# Patient Record
Sex: Male | Born: 1955 | Race: White | Hispanic: No | Marital: Single | Smoking: Current every day smoker
Health system: Southern US, Community
[De-identification: ages and names within clinical notes are randomized; demographics above are authoritative.]

## PROBLEM LIST (undated history)

## (undated) DIAGNOSIS — R569 Unspecified convulsions: Secondary | ICD-10-CM

## (undated) DIAGNOSIS — I639 Cerebral infarction, unspecified: Secondary | ICD-10-CM

---

## 2013-12-19 ENCOUNTER — Emergency Department: Payer: Self-pay | Admitting: Internal Medicine

## 2018-11-28 ENCOUNTER — Encounter: Payer: Self-pay | Admitting: Emergency Medicine

## 2018-11-28 ENCOUNTER — Observation Stay: Payer: Self-pay

## 2018-11-28 ENCOUNTER — Other Ambulatory Visit: Payer: Self-pay

## 2018-11-28 ENCOUNTER — Emergency Department: Payer: Self-pay

## 2018-11-28 ENCOUNTER — Observation Stay
Admission: EM | Admit: 2018-11-28 | Discharge: 2018-11-29 | Disposition: A | Discharge status: Home / Self Care | Payer: Self-pay | Attending: Internal Medicine | Admitting: Internal Medicine

## 2018-11-28 DIAGNOSIS — F10239 Alcohol dependence with withdrawal, unspecified: Principal | ICD-10-CM | POA: Insufficient documentation

## 2018-11-28 DIAGNOSIS — R569 Unspecified convulsions: Secondary | ICD-10-CM | POA: Insufficient documentation

## 2018-11-28 DIAGNOSIS — F10939 Alcohol use, unspecified with withdrawal, unspecified: Secondary | ICD-10-CM | POA: Diagnosis present

## 2018-11-28 DIAGNOSIS — E860 Dehydration: Secondary | ICD-10-CM | POA: Insufficient documentation

## 2018-11-28 DIAGNOSIS — I6782 Cerebral ischemia: Secondary | ICD-10-CM | POA: Insufficient documentation

## 2018-11-28 DIAGNOSIS — I10 Essential (primary) hypertension: Secondary | ICD-10-CM | POA: Insufficient documentation

## 2018-11-28 DIAGNOSIS — S0081XA Abrasion of other part of head, initial encounter: Secondary | ICD-10-CM | POA: Insufficient documentation

## 2018-11-28 DIAGNOSIS — S51012A Laceration without foreign body of left elbow, initial encounter: Secondary | ICD-10-CM | POA: Insufficient documentation

## 2018-11-28 DIAGNOSIS — Z79899 Other long term (current) drug therapy: Secondary | ICD-10-CM | POA: Insufficient documentation

## 2018-11-28 DIAGNOSIS — W19XXXA Unspecified fall, initial encounter: Secondary | ICD-10-CM | POA: Insufficient documentation

## 2018-11-28 DIAGNOSIS — K219 Gastro-esophageal reflux disease without esophagitis: Secondary | ICD-10-CM | POA: Insufficient documentation

## 2018-11-28 DIAGNOSIS — S40212A Abrasion of left shoulder, initial encounter: Secondary | ICD-10-CM | POA: Insufficient documentation

## 2018-11-28 DIAGNOSIS — F1910 Other psychoactive substance abuse, uncomplicated: Secondary | ICD-10-CM | POA: Insufficient documentation

## 2018-11-28 DIAGNOSIS — R55 Syncope and collapse: Secondary | ICD-10-CM | POA: Insufficient documentation

## 2018-11-28 DIAGNOSIS — F17210 Nicotine dependence, cigarettes, uncomplicated: Secondary | ICD-10-CM | POA: Insufficient documentation

## 2018-11-28 LAB — CBC
HCT: 42.3 % (ref 39.0–52.0)
HEMOGLOBIN: 14.7 g/dL (ref 13.0–17.0)
MCH: 34.7 pg — ABNORMAL HIGH (ref 26.0–34.0)
MCHC: 34.8 g/dL (ref 30.0–36.0)
MCV: 99.8 fL (ref 80.0–100.0)
Platelets: 219 10*3/uL (ref 150–400)
RBC: 4.24 MIL/uL (ref 4.22–5.81)
RDW: 12.4 % (ref 11.5–15.5)
WBC: 6.3 10*3/uL (ref 4.0–10.5)
nRBC: 0 % (ref 0.0–0.2)

## 2018-11-28 LAB — URINALYSIS, COMPLETE (UACMP) WITH MICROSCOPIC
Bacteria, UA: NONE SEEN
Bilirubin Urine: NEGATIVE
GLUCOSE, UA: 50 mg/dL — AB
Ketones, ur: NEGATIVE mg/dL
Leukocytes, UA: NEGATIVE
NITRITE: NEGATIVE
PH: 8 (ref 5.0–8.0)
Protein, ur: NEGATIVE mg/dL
SPECIFIC GRAVITY, URINE: 1.01 (ref 1.005–1.030)
Squamous Epithelial / LPF: NONE SEEN (ref 0–5)

## 2018-11-28 LAB — URINE DRUG SCREEN, QUALITATIVE (ARMC ONLY)
Amphetamines, Ur Screen: NOT DETECTED
Barbiturates, Ur Screen: NOT DETECTED
Benzodiazepine, Ur Scrn: NOT DETECTED
COCAINE METABOLITE, UR ~~LOC~~: POSITIVE — AB
Cannabinoid 50 Ng, Ur ~~LOC~~: POSITIVE — AB
MDMA (Ecstasy)Ur Screen: NOT DETECTED
Methadone Scn, Ur: NOT DETECTED
Opiate, Ur Screen: NOT DETECTED
PHENCYCLIDINE (PCP) UR S: NOT DETECTED
Tricyclic, Ur Screen: NOT DETECTED

## 2018-11-28 LAB — BASIC METABOLIC PANEL
Anion gap: 13 (ref 5–15)
BUN: 12 mg/dL (ref 8–23)
CALCIUM: 8.8 mg/dL — AB (ref 8.9–10.3)
CO2: 21 mmol/L — ABNORMAL LOW (ref 22–32)
CREATININE: 0.96 mg/dL (ref 0.61–1.24)
Chloride: 103 mmol/L (ref 98–111)
GFR calc non Af Amer: >60 mL/min (ref >60)
Glucose, Bld: 117 mg/dL — ABNORMAL HIGH (ref 70–99)
Potassium: 3.5 mmol/L (ref 3.5–5.1)
SODIUM: 137 mmol/L (ref 135–145)

## 2018-11-28 LAB — TROPONIN I: Troponin I: <0.03 ng/mL (ref <0.03)

## 2018-11-28 LAB — ETHANOL: Alcohol, Ethyl (B): <10 mg/dL (ref <10)

## 2018-11-28 MED ORDER — NICOTINE 14 MG/24HR TD PT24
14.0000 mg | MEDICATED_PATCH | Freq: Every day | TRANSDERMAL | Status: DC
  Administered 2018-11-28 – 2018-11-29 (×2): 14 mg via TRANSDERMAL
  Filled 2018-11-28 (×2): qty 1

## 2018-11-28 MED ORDER — ADULT MULTIVITAMIN W/MINERALS CH
1.0000 | ORAL_TABLET | Freq: Every day | ORAL | Status: DC
  Administered 2018-11-28 – 2018-11-29 (×2): 1 via ORAL
  Filled 2018-11-28 (×2): qty 1

## 2018-11-28 MED ORDER — ONDANSETRON HCL 4 MG PO TABS: 4.0000 mg | ORAL_TABLET | Freq: Four times a day (QID) | ORAL | Status: DC | PRN

## 2018-11-28 MED ORDER — FOLIC ACID 1 MG PO TABS
1.0000 mg | ORAL_TABLET | Freq: Every day | ORAL | Status: DC
  Administered 2018-11-28 – 2018-11-29 (×2): 1 mg via ORAL
  Filled 2018-11-28 (×2): qty 1

## 2018-11-28 MED ORDER — PANTOPRAZOLE SODIUM 40 MG PO TBEC
40.0000 mg | DELAYED_RELEASE_TABLET | Freq: Every day | ORAL | Status: DC
  Administered 2018-11-29: 40 mg via ORAL
  Filled 2018-11-28: qty 1

## 2018-11-28 MED ORDER — ACETAMINOPHEN 325 MG PO TABS: 650.0000 mg | ORAL_TABLET | Freq: Four times a day (QID) | ORAL | Status: DC | PRN

## 2018-11-28 MED ORDER — THIAMINE HCL 100 MG/ML IJ SOLN: 100.0000 mg | Freq: Every day | INTRAMUSCULAR | Status: DC

## 2018-11-28 MED ORDER — TETANUS-DIPHTH-ACELL PERTUSSIS 5-2.5-18.5 LF-MCG/0.5 IM SUSP
0.5000 mL | Freq: Once | INTRAMUSCULAR | Status: AC
  Administered 2018-11-28: 0.5 mL via INTRAMUSCULAR
  Filled 2018-11-28: qty 0.5

## 2018-11-28 MED ORDER — LORAZEPAM 2 MG PO TABS: 0.0000 mg | ORAL_TABLET | Freq: Four times a day (QID) | ORAL | Status: DC

## 2018-11-28 MED ORDER — CLONIDINE HCL 0.1 MG PO TABS
0.2000 mg | ORAL_TABLET | Freq: Two times a day (BID) | ORAL | Status: DC
  Administered 2018-11-28 – 2018-11-29 (×2): 0.2 mg via ORAL
  Filled 2018-11-28 (×2): qty 2

## 2018-11-28 MED ORDER — SODIUM CHLORIDE 0.9 % IV BOLUS
1000.0000 mL | Freq: Once | INTRAVENOUS | Status: AC
  Administered 2018-11-28: 1000 mL via INTRAVENOUS

## 2018-11-28 MED ORDER — VITAMIN B-1 100 MG PO TABS
100.0000 mg | ORAL_TABLET | Freq: Every day | ORAL | Status: DC
  Administered 2018-11-28 – 2018-11-29 (×2): 100 mg via ORAL
  Filled 2018-11-28 (×2): qty 1

## 2018-11-28 MED ORDER — ENOXAPARIN SODIUM 40 MG/0.4ML ~~LOC~~ SOLN
40.0000 mg | SUBCUTANEOUS | Status: DC
  Administered 2018-11-28: 40 mg via SUBCUTANEOUS
  Filled 2018-11-28: qty 0.4

## 2018-11-28 MED ORDER — BACITRACIN-NEOMYCIN-POLYMYXIN 400-5-5000 EX OINT
TOPICAL_OINTMENT | Freq: Once | CUTANEOUS | Status: AC
  Administered 2018-11-28: 1 via TOPICAL
  Filled 2018-11-28: qty 1

## 2018-11-28 MED ORDER — LORAZEPAM 1 MG PO TABS: 1.0000 mg | ORAL_TABLET | Freq: Four times a day (QID) | ORAL | Status: DC | PRN

## 2018-11-28 MED ORDER — THIAMINE HCL 100 MG/ML IJ SOLN
Freq: Once | INTRAVENOUS | Status: AC
  Administered 2018-11-28: 18:00:00 via INTRAVENOUS
  Filled 2018-11-28: qty 1000

## 2018-11-28 MED ORDER — ONDANSETRON HCL 4 MG/2ML IJ SOLN: 4.0000 mg | Freq: Four times a day (QID) | INTRAMUSCULAR | Status: DC | PRN

## 2018-11-28 MED ORDER — LORAZEPAM 2 MG PO TABS: 0.0000 mg | ORAL_TABLET | Freq: Two times a day (BID) | ORAL | Status: DC

## 2018-11-28 MED ORDER — HYDRALAZINE HCL 20 MG/ML IJ SOLN
10.0000 mg | INTRAMUSCULAR | Status: DC | PRN
  Administered 2018-11-28: 10 mg via INTRAVENOUS
  Filled 2018-11-28: qty 1

## 2018-11-28 MED ORDER — LORAZEPAM 2 MG/ML IJ SOLN
1.0000 mg | Freq: Four times a day (QID) | INTRAMUSCULAR | Status: DC | PRN
  Administered 2018-11-28: 1 mg via INTRAVENOUS
  Filled 2018-11-28: qty 1

## 2018-11-28 MED ORDER — SENNOSIDES-DOCUSATE SODIUM 8.6-50 MG PO TABS: 1.0000 | ORAL_TABLET | Freq: Every evening | ORAL | Status: DC | PRN

## 2018-11-28 MED ORDER — ACETAMINOPHEN 650 MG RE SUPP: 650.0000 mg | Freq: Four times a day (QID) | RECTAL | Status: DC | PRN

## 2018-11-28 NOTE — ED Triage Notes (Signed)
pt presents from home via acems with c/o possible seizure. No hx of seizures, cbg 99. 18G in right AC. BP 172/116 for ems. No hx of medications or PCP. No known allergies. Small skin tear noted to left elbow. Family states that pt may have had stroke 1 month ago, but did not seek medical attention for it. Pt drags left foot now according to family.

## 2018-11-28 NOTE — ED Provider Notes (Signed)
[facility] Emergency Department Provider Note  ____________________________________________  Time seen: Approximately 1:02 PM  I have reviewed the triage vital signs and the nursing notes.   HISTORY  Chief Complaint Seizures    HPI Zachary Pugh. is a 62 y.o. male history of daily alcohol use presenting for recurrent syncope.  The patient reports that over the last several months he has had multiple episodes where he "falls asleep on the couch and wakes up on the floor."  Sometimes, he has urinary incontinence with this.  It has never been witnessed so no comment can be made about tonic-clonic movements.  The patient does not have any associated chest pain, shortness of breath, lightheadedness or syncope.  He has been eating and drinking normally.  He does report to drinking "several beers" daily, with his last alcohol intake yesterday.  He is unable to report whether the prior episodes have occurred when he has stopped drinking as well.  At this time, the patient is symptom-free.  History reviewed. No pertinent past medical history.  There are no active problems to display for this patient.   History reviewed. No pertinent surgical history.    Allergies Patient has no known allergies.  History reviewed. No pertinent family history.  Social History Social History   Tobacco Use  . Smoking status: Current Every Day Smoker    Packs/day: 1.00  . Smokeless tobacco: Never Used  Substance Use Topics  . Alcohol use: Yes  . Drug use: Never    Review of Systems Constitutional: No fever/chills.  Positive syncope. Eyes: No visual changes.  No blurred or double vision. ENT: No sore throat. No congestion or rhinorrhea. Cardiovascular: Denies chest pain. Denies palpitations. Respiratory: Denies shortness of breath.  No cough. Gastrointestinal: No abdominal pain.  No nausea, no vomiting.  No diarrhea.  No constipation. Genitourinary: Negative for  dysuria. Musculoskeletal: Negative for back pain. Skin: Negative for rash. Neurological: Negative for headaches. No focal numbness, tingling or weakness.  Psych: Daily alcohol intake.    ____________________________________________   PHYSICAL EXAM:  VITAL SIGNS: ED Triage Vitals  Enc Vitals Group     BP 11/28/18 1156 (!) 169/109     Pulse Rate 11/28/18 1156 78     Resp 11/28/18 1156 17     Temp 11/28/18 1156 97.6 F (36.4 C)     Temp Source 11/28/18 1156 Oral     SpO2 11/28/18 1156 95 %     Weight 11/28/18 1157 125 lb (56.7 kg)     Height 11/28/18 1157 5\' 8"  (1.727 m)     Head Circumference --      Peak Flow --      Pain Score 11/28/18 1157 0     Pain Loc --      Pain Edu? --      Excl. in GC? --     Constitutional: Alert and oriented. Answers questions appropriately.  GCS is 15. Eyes: Conjunctivae are normal.  EOMI. PERRLA.  No scleral icterus.  No raccoon eyes. Head: See skin examination.. Nose: No congestion/rhinnorhea.  See skin examination. Mouth/Throat: Mucous membranes are moist.  Neck: No stridor.  Supple.  No JVD.  No meningismus. Cardiovascular: Normal rate, regular rhythm. No murmurs, rubs or gallops.  Respiratory: Normal respiratory effort.  No accessory muscle use or retractions. Lungs CTAB.  No wheezes, rales or ronchi. Gastrointestinal: Soft, nontender and nondistended.  No guarding or rebound.  No peritoneal signs. Musculoskeletal: No LE edema. No ttp in  the calves or palpable cords.  Negative Homan's sign.  Full range of motion of the left shoulder, elbow and wrist.  No midline C-spine tenderness to palpation, step-offs or deformities.  No T or L-spine tenderness to palpation, step-offs or deformities. Neurologic:  A&Ox3.  Speech is clear.  Face and smile are symmetric.  EOMI. PERRLA.  Moves all extremities well. Skin:  Skin is warm, dry.  The patient has several superficial linear abrasions to the nose.  He also has 1.5 x 1.5 cm abrasion to the left  shoulder.  1 x 1 cm skin tear in the over the left elbow. Psychiatric: Mood and affect are normal.   ____________________________________________   LABS (all labs ordered are listed, but only abnormal results are displayed)  Labs Reviewed  CBC - Abnormal; Notable for the following components:      Result Value   MCH 34.7 (*)    All other components within normal limits  BASIC METABOLIC PANEL - Abnormal; Notable for the following components:   CO2 21 (*)    Glucose, Bld 117 (*)    Calcium 8.8 (*)    All other components within normal limits  URINALYSIS, COMPLETE (UACMP) WITH MICROSCOPIC  TROPONIN I  ETHANOL  URINE DRUG SCREEN, QUALITATIVE ([facility] ONLY)   ____________________________________________  EKG  ED ECG REPORT I, Anne-Caroline Sharma Covert, the attending physician, personally viewed and interpreted this ECG.   Date: 11/28/2018  EKG Time: 1208  Rate: 70  Rhythm: normal sinus rhythm  Axis: normal  Intervals:prolonged Qtc  ST&T Change: No STEMI  ____________________________________________  RADIOLOGY  Ct Head Wo Contrast  Result Date: 11/28/2018 CLINICAL DATA:  Possible seizure today. EXAM: CT HEAD WITHOUT CONTRAST TECHNIQUE: Contiguous axial images were obtained from the base of the skull through the vertex without intravenous contrast. COMPARISON:  None. FINDINGS: Brain: No evidence of acute infarction, hemorrhage, hydrocephalus, extra-axial collection or mass lesion/mass effect. Patchy and confluent hypoattenuation in the subcortical and periventricular deep white matter is consistent with chronic microvascular ischemic change. Vascular: Atherosclerosis is noted. Skull: Intact.  No focal lesion. Sinuses/Orbits: Negative. Other: None. IMPRESSION: No acute abnormality. Chronic microvascular ischemic change. Atherosclerosis. Electronically Signed   By: Drusilla Kanner M.D.   On: 11/28/2018 12:31     ____________________________________________   PROCEDURES  Procedure(s) performed: None  Procedures  Critical Care performed: No ____________________________________________   INITIAL IMPRESSION / ASSESSMENT AND PLAN / ED COURSE  Pertinent labs & imaging results that were available during my care of the patient were reviewed by me and considered in my medical decision making (see chart for details).  62 y.o. male with a history of daily alcohol use resenting for recurrent syncope of unknown etiology.  Overall, the patient does have hypertension, no focal neurologic deficits on my examination.  Is possible that the patient is having alcohol withdrawal seizures, although he states he had alcohol yesterday which would be somewhat early for withdrawal seizures.  The patient CT scan does not show any mass or evidence of infarct.  An MRI of the brain has been ordered.  The patient's electrolytes are reassuring, and his blood counts are normal.  Urine drug screen and alcohol level have been ordered.  Troponin and Chest Xray is pending.  ____________________________________________  FINAL CLINICAL IMPRESSION(S) / ED DIAGNOSES  Final diagnoses:  Recurrent syncope  Abrasion, face w/o infection  Abrasion of left shoulder, initial encounter  Skin tear of left elbow without complication, initial encounter         NEW  MEDICATIONS STARTED DURING THIS VISIT:  New Prescriptions   No medications on file      Rockne MenghiniNorman, Anne-Caroline, MD 11/28/18 1315

## 2018-11-28 NOTE — Progress Notes (Signed)
Advanced care plan.  Purpose of the Encounter: CODE STATUS  Parties in Attendance:Patient  Patient's Decision Capacity:Good  Subjective/Patient's story: Presented to the emergency room for questionable seizure  Objective/Medical story Patient drinks alcohol on a regular basis Appears to be in withdrawal Needs management for alcohol withdrawal and IV fluids  Goals of care determination:  Advance care directives and goals of care discussed Patient wants everything for now which includes cpr , intubation and ventilator if need arises  CODE STATUS: Full code  Time spent discussing advanced care planning: 16 minutes

## 2018-11-28 NOTE — ED Notes (Signed)
Patient transported to CT 

## 2018-11-28 NOTE — ED Notes (Signed)
Pt assisted with urinal. Pt cleaned and repositioned in bed. This RN will continue to monitor.

## 2018-11-28 NOTE — ED Notes (Signed)
Pt reports he has no recollection of seizure. Pt reports to this RN that he does not remember ambulance picking him up today.

## 2018-11-28 NOTE — H&P (Signed)
[facility] - [place] at [facility]   PATIENT NAME: Zachary Pugh    MR#:  [id number]  DATE OF BIRTH:  27-Nov-1956  DATE OF ADMISSION:  11/28/2018  PRIMARY CARE PHYSICIAN: Patient, No Pcp Per   REQUESTING/REFERRING PHYSICIAN:   CHIEF COMPLAINT:   Chief Complaint  Patient presents with  . Seizures    HISTORY OF PRESENT ILLNESS: Zachary Pugh  is a 62 y.o. male with a known history of alcohol abuse, tobacco abuse presented to the emergency room for questionable seizure.  Patient states he drinks alcohol on a regular basis.  He has beer every day.  Says he sleeps on the couch and landed on the floor.  This has been happening consistently.  He was evaluated in the emergency room for seizures secondary to alcohol withdrawal.  Has some tremors in the upper extremities.  No history of head injury.  PAST MEDICAL HISTORY:  Alcohol abuse  PAST SURGICAL HISTORY: None  SOCIAL HISTORY:  Social History   Tobacco Use  . Smoking status: Current Every Day Smoker    Packs/day: 1.00  . Smokeless tobacco: Never Used  Substance Use Topics  . Alcohol use: Yes    FAMILY HISTORY: Mother deceased Father no history of copd, diabetes, hypertension  DRUG ALLERGIES: No Known Allergies  REVIEW OF SYSTEMS:   CONSTITUTIONAL: No fever,has  fatigue and weakness.  EYES: No blurred or double vision.  EARS, NOSE, AND THROAT: No tinnitus or ear pain.  RESPIRATORY: No cough, shortness of breath, wheezing or hemoptysis.  CARDIOVASCULAR: No chest pain, orthopnea, edema.  GASTROINTESTINAL: No nausea, vomiting, diarrhea or abdominal pain.  GENITOURINARY: No dysuria, hematuria.  ENDOCRINE: No polyuria, nocturia,  HEMATOLOGY: No anemia, easy bruising or bleeding SKIN: No rash or lesion. MUSCULOSKELETAL: No joint pain or arthritis. Tremors upper extremities.   NEUROLOGIC: No tingling, numbness, weakness.  PSYCHIATRY: No anxiety or depression.   MEDICATIONS AT HOME:  Prior to  Admission medications   Medication Sig Start Date End Date Taking? Authorizing Provider  omeprazole (PRILOSEC) 20 MG capsule Take 20 mg by mouth daily.   Yes [provider]      PHYSICAL EXAMINATION:   VITAL SIGNS: Blood pressure (!) 164/79, pulse 80, temperature 97.6 F (36.4 C), temperature source Oral, resp. rate (!) 22, height 5\' 8"  (1.727 m), weight 56.7 kg, SpO2 100 %.  GENERAL:  62 y.o.-year-old patient lying in the bed with no acute distress.  EYES: Pupils equal, round, reactive to light and accommodation. No scleral icterus. Extraocular muscles intact.  HEENT: Head atraumatic, normocephalic. Oropharynx and nasopharynx clear.  NECK:  Supple, no jugular venous distention. No thyroid enlargement, no tenderness.  LUNGS: Normal breath sounds bilaterally, no wheezing, rales,rhonchi or crepitation. No use of accessory muscles of respiration.  CARDIOVASCULAR: S1, S2 normal. No murmurs, rubs, or gallops.  ABDOMEN: Soft, nontender, nondistended. Bowel sounds present. No organomegaly or mass.  EXTREMITIES: No pedal edema, cyanosis, or clubbing.  Tremors upper extremities NEUROLOGIC: Cranial nerves II through XII are intact. Muscle strength 5/5 in all extremities. Sensation intact. Gait not checked.  PSYCHIATRIC: The patient is alert and oriented x 3.  SKIN: No obvious rash, lesion, or ulcer.   LABORATORY PANEL:   CBC Recent Labs  Lab 11/28/18 1205  WBC 6.3  HGB 14.7  HCT 42.3  PLT 219  MCV 99.8  MCH 34.7*  MCHC 34.8  RDW 12.4   ------------------------------------------------------------------------------------------------------------------  Chemistries  Recent Labs  Lab 11/28/18 1205  NA 137  K  3.5  CL 103  CO2 21*  GLUCOSE 117*  BUN 12  CREATININE 0.96  CALCIUM 8.8*   ------------------------------------------------------------------------------------------------------------------ estimated creatinine clearance is 64 mL/min (by C-G formula based on SCr  of 0.96 mg/dL). ------------------------------------------------------------------------------------------------------------------ No results for input(s): TSH, T4TOTAL, T3FREE, THYROIDAB in the last 72 hours.  Invalid input(s): FREET3   Coagulation profile No results for input(s): INR, PROTIME in the last 168 hours. ------------------------------------------------------------------------------------------------------------------- No results for input(s): DDIMER in the last 72 hours. -------------------------------------------------------------------------------------------------------------------  Cardiac Enzymes Recent Labs  Lab 11/28/18 1205  TROPONINI <0.03   ------------------------------------------------------------------------------------------------------------------ Invalid input(s): POCBNP  ---------------------------------------------------------------------------------------------------------------  Urinalysis    Component Value Date/Time   COLORURINE STRAW (A) 11/28/2018 1305   APPEARANCEUR CLEAR (A) 11/28/2018 1305   LABSPEC 1.010 11/28/2018 1305   PHURINE 8.0 11/28/2018 1305   GLUCOSEU 50 (A) 11/28/2018 1305   HGBUR SMALL (A) 11/28/2018 1305   BILIRUBINUR NEGATIVE 11/28/2018 1305   KETONESUR NEGATIVE 11/28/2018 1305   PROTEINUR NEGATIVE 11/28/2018 1305   NITRITE NEGATIVE 11/28/2018 1305   LEUKOCYTESUR NEGATIVE 11/28/2018 1305     RADIOLOGY: Ct Head Wo Contrast  Result Date: 11/28/2018 CLINICAL DATA:  Possible seizure today. EXAM: CT HEAD WITHOUT CONTRAST TECHNIQUE: Contiguous axial images were obtained from the base of the skull through the vertex without intravenous contrast. COMPARISON:  None. FINDINGS: Brain: No evidence of acute infarction, hemorrhage, hydrocephalus, extra-axial collection or mass lesion/mass effect. Patchy and confluent hypoattenuation in the subcortical and periventricular deep white matter is consistent with chronic microvascular  ischemic change. Vascular: Atherosclerosis is noted. Skull: Intact.  No focal lesion. Sinuses/Orbits: Negative. Other: None. IMPRESSION: No acute abnormality. Chronic microvascular ischemic change. Atherosclerosis. Electronically Signed   By: Drusilla Kannerhomas  Dalessio M.D.   On: 11/28/2018 12:31    EKG: Orders placed or performed during the hospital encounter of 11/28/18  . ED EKG  . ED EKG    IMPRESSION AND PLAN:  62 year old male patient with history of alcohol abuse, tobacco abuse presented to the emergency room for weakness, tremors in the upper extremity  -Alcohol withdrawal IV fluids Thiamine folic acid supplements CIWA protocol Admit under observation  -Tobacco abuse Tobacco cessation counseled to the patient for 6 minutes Nicotine patch offered  -Dehydration IV fluid hydration with normal saline  -DVT prophylaxis subcu Lovenox daily  All the records are reviewed and case discussed with ED provider. Management plans discussed with the patient, family and they are in agreement.  CODE STATUS:Full code    TOTAL TIME TAKING CARE OF THIS PATIENT: 53 minutes.    Ihor AustinPavan Pyreddy M.D on 11/28/2018 at 2:31 PM  Between 7am to 6pm - Pager - [phone]  After 6pm go to [email or web address] - password [email or web address]  [facility] [place] [facility]  Office  [phone]  CC: Primary care physician; Patient, No Pcp Per

## 2018-11-29 LAB — BASIC METABOLIC PANEL
Anion gap: 9 (ref 5–15)
BUN: 12 mg/dL (ref 8–23)
CO2: 21 mmol/L — ABNORMAL LOW (ref 22–32)
Calcium: 8.2 mg/dL — ABNORMAL LOW (ref 8.9–10.3)
Chloride: 104 mmol/L (ref 98–111)
Creatinine, Ser: 1 mg/dL (ref 0.61–1.24)
GFR calc Af Amer: >60 mL/min (ref >60)
GLUCOSE: 93 mg/dL (ref 70–99)
Potassium: 3.3 mmol/L — ABNORMAL LOW (ref 3.5–5.1)
Sodium: 134 mmol/L — ABNORMAL LOW (ref 135–145)

## 2018-11-29 LAB — CBC
HCT: 37.9 % — ABNORMAL LOW (ref 39.0–52.0)
Hemoglobin: 13.1 g/dL (ref 13.0–17.0)
MCH: 34.5 pg — ABNORMAL HIGH (ref 26.0–34.0)
MCHC: 34.6 g/dL (ref 30.0–36.0)
MCV: 99.7 fL (ref 80.0–100.0)
Platelets: 224 10*3/uL (ref 150–400)
RBC: 3.8 MIL/uL — ABNORMAL LOW (ref 4.22–5.81)
RDW: 12.4 % (ref 11.5–15.5)
WBC: 6.9 10*3/uL (ref 4.0–10.5)
nRBC: 0 % (ref 0.0–0.2)

## 2018-11-29 MED ORDER — AMLODIPINE BESYLATE 5 MG PO TABS: 5.0000 mg | ORAL_TABLET | Freq: Every day | ORAL | 1 refills | Status: DC

## 2018-11-29 MED ORDER — POTASSIUM CHLORIDE CRYS ER 20 MEQ PO TBCR
40.0000 meq | EXTENDED_RELEASE_TABLET | ORAL | Status: AC
  Administered 2018-11-29 (×2): 40 meq via ORAL
  Filled 2018-11-29 (×2): qty 2

## 2018-11-29 NOTE — Care Management Note (Signed)
Case Management Note  Patient Details  Name: Zachary DolphinJames W Henrichs Jr. MRN: [id number] Date of Birth: 1956-03-20   Patient to discharge today.  Provided application to Medication Management  [facility] . Patient to discharge on Norvasc.  [facility] [address]  RNCM checked [email or web address] and cost is $9.  No coupon needed.  Patient confirms he will be able to pick up after discharge.   Subjective/Objective:                    Action/Plan:   Expected Discharge Date:  11/29/18               Expected Discharge Plan:  Home/Self Care  In-House Referral:     Discharge planning Services  CM Consult, Medication Assistance, Indigent Health Clinic  Post Acute Care Choice:    Choice offered to:     DME Arranged:    DME Agency:     HH Arranged:    HH Agency:     Status of Service:  Completed, signed off  If discussed at [employer] of [employer] Meetings, dates discussed:    Additional Comments:  Chapman FitchBOWEN, Kiylee Thoreson T, RN 11/29/2018, 3:12 PM

## 2018-11-29 NOTE — Progress Notes (Signed)
Patient discharged to home via POV driven by brother Trey PaulaJeff. Patient stated, "feeling a lot better today than yesterday". Discharge instructions given. Prescription given. Patient able to verbalize signs and symptoms of increased blood pressure. IV removed. Belongings gathered. Wheeled to visitors entrance by NT.

## 2018-11-30 LAB — HIV ANTIBODY (ROUTINE TESTING W REFLEX): HIV Screen 4th Generation wRfx: NONREACTIVE

## 2018-12-08 NOTE — Discharge Summary (Signed)
[facility] [place] at [facility]   PATIENT NAME: Zachary FreerJames Pugh    MR#:  [id number]  DATE OF BIRTH:  Oct 18, 1956  DATE OF ADMISSION:  11/28/2018   ADMITTING PHYSICIAN: Ihor AustinPavan Pyreddy, MD  DATE OF DISCHARGE: 11/29/2018  4:44 PM  PRIMARY CARE PHYSICIAN: Patient, No Pcp Per   ADMISSION DIAGNOSIS:   Abrasion, face w/o infection [S00.81XA] Abrasion of left shoulder, initial encounter [S40.212A] Skin tear of left elbow without complication, initial encounter [S51.012A] Recurrent syncope [R55]  DISCHARGE DIAGNOSIS:   Active Problems:   Alcohol withdrawal (HCC)   SECONDARY DIAGNOSIS:   History reviewed. No pertinent past medical history.  HOSPITAL COURSE:   62 year old male with past medical history significant for smoking, alcohol abuse and drug abuse presents to hospital secondary to an episode of fall while sleeping.  1.  Fall while sleeping-concern for seizure.  However now witnessed seizure activity -Patient has been alert and oriented since admission. -Patient states he drank alcohol until the morning before.  Urine tox was positive for cocaine and marijuana -Could have been toxic encephalopathy from polysubstance use. -No new seizure medications were started -CT of the head negative for any acute findings  2.  Polysubstance abuse-strongly counseled  3.  Hypertension-started on Norvasc here  4.  GERD-on Prilosec  Patient is independent and ambulatory.  DISCHARGE CONDITIONS:   Guarded CONSULTS OBTAINED:   None  DRUG ALLERGIES:   No Known Allergies DISCHARGE MEDICATIONS:   Allergies as of 11/29/2018   No Known Allergies     Medication List    TAKE these medications   amLODipine 5 MG tablet Commonly known as:  NORVASC Take 1 tablet (5 mg total) by mouth daily.   omeprazole 20 MG capsule Commonly known as:  PRILOSEC Take 20 mg by mouth daily.        DISCHARGE INSTRUCTIONS:   1.  PCP follow-up in 1 to 2 weeks  DIET:    Cardiac diet  ACTIVITY:   Activity as tolerated  OXYGEN:   Home Oxygen: No.  Oxygen Delivery: room air  DISCHARGE LOCATION:   home   If you experience worsening of your admission symptoms, develop shortness of breath, life threatening emergency, suicidal or homicidal thoughts you must seek medical attention immediately by calling 911 or calling your MD immediately  if symptoms less severe.  You Must read complete instructions/literature along with all the possible adverse reactions/side effects for all the Medicines you take and that have been prescribed to you. Take any new Medicines after you have completely understood and accpet all the possible adverse reactions/side effects.   Please note  You were cared for by a hospitalist during your hospital stay. If you have any questions about your discharge medications or the care you received while you were in the hospital after you are discharged, you can call the unit and asked to speak with the hospitalist on call if the hospitalist that took care of you is not available. Once you are discharged, your primary care physician will handle any further medical issues. Please note that NO REFILLS for any discharge medications will be authorized once you are discharged, as it is imperative that you return to your primary care physician (or establish a relationship with a primary care physician if you do not have one) for your aftercare needs so that they can reassess your need for medications and monitor your lab values.    On the day of Discharge:  VITAL SIGNS:   Blood  pressure 125/78, pulse 88, temperature 98.3 F (36.8 C), temperature source Oral, resp. rate 17, height 5\' 8"  (1.727 m), weight 56.7 kg, SpO2 99 %.  PHYSICAL EXAMINATION:    GENERAL:  62 y.o.-year-old patient lying in the bed with no acute distress.  EYES: Pupils equal, round, reactive to light and accommodation. No scleral icterus. Extraocular muscles intact.  HEENT:  Head atraumatic, normocephalic. Oropharynx and nasopharynx clear.  NECK:  Supple, no jugular venous distention. No thyroid enlargement, no tenderness.  LUNGS: Normal breath sounds bilaterally, no wheezing, rales,rhonchi or crepitation. No use of accessory muscles of respiration.  CARDIOVASCULAR: S1, S2 normal. No murmurs, rubs, or gallops.  ABDOMEN: Soft, non-tender, non-distended. Bowel sounds present. No organomegaly or mass.  EXTREMITIES: No pedal edema, cyanosis, or clubbing.  NEUROLOGIC: Cranial nerves II through XII are intact. Muscle strength 5/5 in all extremities. Sensation intact. Gait not checked.  PSYCHIATRIC: The patient is alert and oriented x 3.  SKIN: No obvious rash, lesion, or ulcer.   DATA REVIEW:   CBC No results for input(s): WBC, HGB, HCT, PLT in the last 168 hours.  Chemistries  No results for input(s): NA, K, CL, CO2, GLUCOSE, BUN, CREATININE, CALCIUM, MG, AST, ALT, ALKPHOS, BILITOT in the last 168 hours.  Invalid input(s): GFRCGP   Microbiology Results  No results found for this or any previous visit.  RADIOLOGY:  No results found.   Management plans discussed with the patient, family and they are in agreement.  CODE STATUS:  Code Status History    Date Active Date Inactive Code Status Order ID Comments User Context   11/28/2018 1656 11/29/2018 1944 Full Code [id number]  Ihor AustinPyreddy, Pavan, MD Inpatient      TOTAL TIME TAKING CARE OF THIS PATIENT: 38 minutes.    Enid Baasadhika Paul Trettin M.D on 12/08/2018 at 8:21 AM  Between 7am to 6pm - Pager - [phone]  After 6pm go to [email or web address] - Social research officer, [email or web address] [email or web address]  [facility] [place] [facility]  Office  [phone]  CC: Primary care physician; Patient, No Pcp Per   Note: This dictation was prepared with Dragon dictation along with smaller phrase technology. Any transcriptional errors that result from this process are unintentional.

## 2019-07-27 ENCOUNTER — Emergency Department
Admission: EM | Admit: 2019-07-27 | Discharge: 2019-07-27 | Disposition: A | Discharge status: Home / Self Care | Payer: Self-pay | Attending: Emergency Medicine | Admitting: Emergency Medicine

## 2019-07-27 ENCOUNTER — Other Ambulatory Visit: Payer: Self-pay

## 2019-07-27 DIAGNOSIS — R569 Unspecified convulsions: Secondary | ICD-10-CM

## 2019-07-27 DIAGNOSIS — G4089 Other seizures: Secondary | ICD-10-CM | POA: Insufficient documentation

## 2019-07-27 DIAGNOSIS — F17210 Nicotine dependence, cigarettes, uncomplicated: Secondary | ICD-10-CM | POA: Insufficient documentation

## 2019-07-27 HISTORY — DX: Unspecified convulsions: R56.9

## 2019-07-27 HISTORY — DX: Cerebral infarction, unspecified: I63.9

## 2019-07-27 LAB — URINALYSIS, COMPLETE (UACMP) WITH MICROSCOPIC
Bilirubin Urine: NEGATIVE
Glucose, UA: NEGATIVE mg/dL
Hgb urine dipstick: NEGATIVE
Ketones, ur: NEGATIVE mg/dL
Leukocytes,Ua: NEGATIVE
Nitrite: NEGATIVE
Protein, ur: 100 mg/dL — AB
Specific Gravity, Urine: 1.016 (ref 1.005–1.030)
Squamous Epithelial / HPF: NONE SEEN (ref 0–5)
pH: 9 — ABNORMAL HIGH (ref 5.0–8.0)

## 2019-07-27 LAB — COMPREHENSIVE METABOLIC PANEL
ALT: 14 U/L (ref 0–44)
AST: 38 U/L (ref 15–41)
Albumin: 3.4 g/dL — ABNORMAL LOW (ref 3.5–5.0)
Alkaline Phosphatase: 63 U/L (ref 38–126)
Anion gap: 15 (ref 5–15)
BUN: 12 mg/dL (ref 8–23)
CO2: 27 mmol/L (ref 22–32)
Calcium: 8.4 mg/dL — ABNORMAL LOW (ref 8.9–10.3)
Chloride: 92 mmol/L — ABNORMAL LOW (ref 98–111)
Creatinine, Ser: 1.33 mg/dL — ABNORMAL HIGH (ref 0.61–1.24)
GFR calc Af Amer: >60 mL/min (ref >60)
GFR calc non Af Amer: 57 mL/min — ABNORMAL LOW (ref >60)
Glucose, Bld: 121 mg/dL — ABNORMAL HIGH (ref 70–99)
Potassium: 3.1 mmol/L — ABNORMAL LOW (ref 3.5–5.1)
Sodium: 134 mmol/L — ABNORMAL LOW (ref 135–145)
Total Bilirubin: 0.8 mg/dL (ref 0.3–1.2)
Total Protein: 6.1 g/dL — ABNORMAL LOW (ref 6.5–8.1)

## 2019-07-27 LAB — URINE DRUG SCREEN, QUALITATIVE (ARMC ONLY)
Amphetamines, Ur Screen: NOT DETECTED
Barbiturates, Ur Screen: NOT DETECTED
Benzodiazepine, Ur Scrn: NOT DETECTED
Cannabinoid 50 Ng, Ur ~~LOC~~: NOT DETECTED
Cocaine Metabolite,Ur ~~LOC~~: POSITIVE — AB
MDMA (Ecstasy)Ur Screen: NOT DETECTED
Methadone Scn, Ur: NOT DETECTED
Opiate, Ur Screen: NOT DETECTED
Phencyclidine (PCP) Ur S: NOT DETECTED
Tricyclic, Ur Screen: NOT DETECTED

## 2019-07-27 LAB — CBC WITH DIFFERENTIAL/PLATELET
Abs Immature Granulocytes: 0.01 10*3/uL (ref 0.00–0.07)
Basophils Absolute: 0 10*3/uL (ref 0.0–0.1)
Basophils Relative: 1 %
Eosinophils Absolute: 0 10*3/uL (ref 0.0–0.5)
Eosinophils Relative: 0 %
HCT: 38.1 % — ABNORMAL LOW (ref 39.0–52.0)
Hemoglobin: 13 g/dL (ref 13.0–17.0)
Immature Granulocytes: 0 %
Lymphocytes Relative: 13 %
Lymphs Abs: 0.7 10*3/uL (ref 0.7–4.0)
MCH: 34.6 pg — ABNORMAL HIGH (ref 26.0–34.0)
MCHC: 34.1 g/dL (ref 30.0–36.0)
MCV: 101.3 fL — ABNORMAL HIGH (ref 80.0–100.0)
Monocytes Absolute: 0.5 10*3/uL (ref 0.1–1.0)
Monocytes Relative: 9 %
Neutro Abs: 4.3 10*3/uL (ref 1.7–7.7)
Neutrophils Relative %: 77 %
Platelets: 216 10*3/uL (ref 150–400)
RBC: 3.76 MIL/uL — ABNORMAL LOW (ref 4.22–5.81)
RDW: 11.9 % (ref 11.5–15.5)
WBC: 5.6 10*3/uL (ref 4.0–10.5)
nRBC: 0 % (ref 0.0–0.2)

## 2019-07-27 LAB — GLUCOSE, CAPILLARY: Glucose-Capillary: 112 mg/dL — ABNORMAL HIGH (ref 70–99)

## 2019-07-27 LAB — ETHANOL: Alcohol, Ethyl (B): <10 mg/dL (ref <10)

## 2019-07-27 MED ORDER — LEVETIRACETAM 500 MG PO TABS
500.0000 mg | ORAL_TABLET | Freq: Once | ORAL | Status: AC
  Administered 2019-07-27: 14:00:00 500 mg via ORAL
  Filled 2019-07-27: qty 1

## 2019-07-27 MED ORDER — LORAZEPAM 2 MG/ML IJ SOLN
1.0000 mg | Freq: Once | INTRAMUSCULAR | Status: AC
  Administered 2019-07-27: 1 mg via INTRAVENOUS
  Filled 2019-07-27: qty 1

## 2019-07-27 MED ORDER — LEVETIRACETAM 500 MG PO TABS: 500.0000 mg | ORAL_TABLET | Freq: Two times a day (BID) | ORAL | 1 refills | Status: DC

## 2019-07-27 MED ORDER — SODIUM CHLORIDE 0.9 % IV SOLN
Freq: Once | INTRAVENOUS | Status: AC
  Administered 2019-07-27: 12:00:00 via INTRAVENOUS

## 2019-07-27 NOTE — ED Notes (Signed)
Pt attempting to get ahold of Merry Proud who will be pt's ride home. Merry Proud won't answer at number in demographics. Pt unsure of any other numbers to call from hospital phone. Doesn't have personal phone with him.

## 2019-07-27 NOTE — ED Notes (Signed)
Pt unable to get ahold of any other friends or family to get home. Agricultural consultant notified. May need bus pass.

## 2019-07-27 NOTE — ED Notes (Signed)
Family Jeff called this RN. Merry Proud notified pt being d/c. Merry Proud on his way to pick pt up.

## 2019-07-27 NOTE — ED Notes (Signed)
Pt given warm blanket.

## 2019-07-27 NOTE — ED Notes (Signed)
Pt wheeled out to lobby.  

## 2019-07-27 NOTE — ED Notes (Signed)
Seizure pads in place

## 2019-07-27 NOTE — ED Notes (Addendum)
Pt sleeping. Bed locked low. Rails up. Seizure pads remain in place. Call bell within reach. Urinal remains attached to bed rail; pt reminded to provide sample when possible.

## 2019-07-27 NOTE — ED Provider Notes (Signed)
[facility] Emergency Department Provider Note       Time seen: ----------------------------------------- 11:22 AM on 07/27/2019 -----------------------------------------   I have reviewed the triage vital signs and the nursing notes.  HISTORY   Chief Complaint Seizures    HPI Zachary DolphinJames W Buckman Jr. is a 63 y.o. male with a history of alcohol withdrawal who presents to the ED for witnessed seizure at home.  Patient was confused and postictal with EMS.  He denies any pain, states he drinks occasional alcohol, denies any drug use.  He denies any recent illness or other complaints.  No past medical history on file.  Patient Active Problem List   Diagnosis Date Noted  . Alcohol withdrawal (HCC) 11/28/2018    No past surgical history on file.  Allergies Patient has no known allergies.  Social History Social History   Tobacco Use  . Smoking status: Current Every Day Smoker    Packs/day: 1.00  . Smokeless tobacco: Never Used  Substance Use Topics  . Alcohol use: Yes  . Drug use: Never   Review of Systems Constitutional: Negative for fever. Cardiovascular: Negative for chest pain. Respiratory: Negative for shortness of breath. Gastrointestinal: Negative for abdominal pain, vomiting and diarrhea. Musculoskeletal: Negative for back pain. Skin: Negative for rash. Neurological: Negative for headaches, focal weakness or numbness.  Positive for seizure  All systems negative/normal/unremarkable except as stated in the HPI  ____________________________________________   PHYSICAL EXAM:  VITAL SIGNS: ED Triage Vitals  Enc Vitals Group     BP      Pulse      Resp      Temp      Temp src      SpO2      Weight      Height      Head Circumference      Peak Flow      Pain Score      Pain Loc      Pain Edu?      Excl. in GC?    Constitutional: Alert and oriented.  No distress Eyes: Conjunctivae are normal. Normal extraocular  movements. ENT      Head: Normocephalic and atraumatic.      Nose: No congestion/rhinnorhea.      Mouth/Throat: Mucous membranes are moist.      Neck: No stridor. Cardiovascular: Normal rate, regular rhythm. No murmurs, rubs, or gallops. Respiratory: Normal respiratory effort without tachypnea nor retractions. Breath sounds are clear and equal bilaterally. No wheezes/rales/rhonchi. Gastrointestinal: Soft and nontender. Normal bowel sounds Musculoskeletal: Nontender with normal range of motion in extremities. No lower extremity tenderness nor edema. Neurologic:  Normal speech and language. No gross focal neurologic deficits are appreciated.  Skin:  Skin is warm, dry and intact. No rash noted. Psychiatric: Mood and affect are normal.  ____________________________________________  ED COURSE:  As part of my medical decision making, I reviewed the following data within the electronic MEDICAL RECORD NUMBER History obtained from family if available, nursing notes, old chart and ekg, as well as notes from prior ED visits. Patient presented for possible seizure, we will assess with labs as indicated at this time.   Procedures  Zachary DolphinJames W Ging Jr. was evaluated in Emergency Department on 07/27/2019 for the symptoms described in the history of present illness. He was evaluated in the context of the global COVID-19 pandemic, which necessitated consideration that the patient might be at risk for infection with the SARS-CoV-2 virus that causes COVID-19. Institutional protocols and  algorithms that pertain to the evaluation of patients at risk for COVID-19 are in a state of rapid change based on information released by regulatory bodies including the CDC and federal and state organizations. These policies and algorithms were followed during the patient's care in the ED.  ____________________________________________   LABS (pertinent positives/negatives)  Labs Reviewed  CBC WITH DIFFERENTIAL/PLATELET -  Abnormal; Notable for the following components:      Result Value   RBC 3.76 (*)    HCT 38.1 (*)    MCV 101.3 (*)    MCH 34.6 (*)    All other components within normal limits  COMPREHENSIVE METABOLIC PANEL - Abnormal; Notable for the following components:   Sodium 134 (*)    Potassium 3.1 (*)    Chloride 92 (*)    Glucose, Bld 121 (*)    Creatinine, Ser 1.33 (*)    Calcium 8.4 (*)    Total Protein 6.1 (*)    Albumin 3.4 (*)    GFR calc non Af Amer 57 (*)    All other components within normal limits  GLUCOSE, CAPILLARY - Abnormal; Notable for the following components:   Glucose-Capillary 112 (*)    All other components within normal limits  ETHANOL  URINALYSIS, COMPLETE (UACMP) WITH MICROSCOPIC  URINE DRUG SCREEN, QUALITATIVE ([facility] ONLY)  CBG MONITORING, ED   ___________________________________________   DIFFERENTIAL DIAGNOSIS   Seizure, alcohol withdrawal, substance abuse  FINAL ASSESSMENT AND PLAN  Seizure   Plan: The patient had presented for seizure that occurred at home. Patient's labs revealed some chronic abnormalities without any other acute process.  He was given IV fluids here.  Labs reflect likely chronic alcoholism.  He was given a dose of IV Ativan to prevent further seizures and we started him on oral Keppra.  He is cleared for outpatient follow-up.   Laurence Aly, MD    Note: This note was generated in part or whole with voice recognition software. Voice recognition is usually quite accurate but there are transcription errors that can and very often do occur. I apologize for any typographical errors that were not detected and corrected.     Earleen Newport, MD 07/27/19 1329

## 2019-07-27 NOTE — ED Triage Notes (Addendum)
Pt in via EMS d/t two witnessed seizures at home. Confused/post ictal with EMS. A&Ox4 upon arrival. Pt denies head pain or pain anywhere else. History of seizures; not on meds for them. CBG 115; 138/74BP; 95% RA; 79HR; 18g IV L fa by EMS.

## 2020-02-17 ENCOUNTER — Emergency Department: Payer: Self-pay

## 2020-02-17 ENCOUNTER — Emergency Department
Admission: EM | Admit: 2020-02-17 | Discharge: 2020-02-17 | Disposition: A | Discharge status: Home / Self Care | Payer: Self-pay | Attending: Emergency Medicine | Admitting: Emergency Medicine

## 2020-02-17 ENCOUNTER — Other Ambulatory Visit: Payer: Self-pay

## 2020-02-17 DIAGNOSIS — Z87898 Personal history of other specified conditions: Secondary | ICD-10-CM

## 2020-02-17 DIAGNOSIS — F17200 Nicotine dependence, unspecified, uncomplicated: Secondary | ICD-10-CM | POA: Insufficient documentation

## 2020-02-17 DIAGNOSIS — R29810 Facial weakness: Secondary | ICD-10-CM | POA: Insufficient documentation

## 2020-02-17 DIAGNOSIS — R569 Unspecified convulsions: Secondary | ICD-10-CM | POA: Insufficient documentation

## 2020-02-17 DIAGNOSIS — Z79899 Other long term (current) drug therapy: Secondary | ICD-10-CM | POA: Insufficient documentation

## 2020-02-17 DIAGNOSIS — F1491 Cocaine use, unspecified, in remission: Secondary | ICD-10-CM

## 2020-02-17 DIAGNOSIS — R825 Elevated urine levels of drugs, medicaments and biological substances: Secondary | ICD-10-CM

## 2020-02-17 DIAGNOSIS — E030 Congenital hypothyroidism with diffuse goiter: Secondary | ICD-10-CM | POA: Insufficient documentation

## 2020-02-17 HISTORY — DX: Elevated urine levels of drugs, medicaments and biological substances: R82.5

## 2020-02-17 HISTORY — DX: Personal history of other specified conditions: Z87.898

## 2020-02-17 HISTORY — DX: Cocaine use, unspecified, in remission: F14.91

## 2020-02-17 LAB — URINALYSIS, ROUTINE W REFLEX MICROSCOPIC
Bilirubin Urine: NEGATIVE
Glucose, UA: NEGATIVE mg/dL
Hgb urine dipstick: NEGATIVE
Ketones, ur: NEGATIVE mg/dL
Leukocytes,Ua: NEGATIVE
Nitrite: NEGATIVE
Protein, ur: NEGATIVE mg/dL
Specific Gravity, Urine: 1.011 (ref 1.005–1.030)
pH: 6 (ref 5.0–8.0)

## 2020-02-17 LAB — CBC WITH DIFFERENTIAL/PLATELET
Abs Immature Granulocytes: 0.03 10*3/uL (ref 0.00–0.07)
Basophils Absolute: 0 10*3/uL (ref 0.0–0.1)
Basophils Relative: 1 %
Eosinophils Absolute: 0.2 10*3/uL (ref 0.0–0.5)
Eosinophils Relative: 3 %
HCT: 42.1 % (ref 39.0–52.0)
Hemoglobin: 14.9 g/dL (ref 13.0–17.0)
Immature Granulocytes: 0 %
Lymphocytes Relative: 15 %
Lymphs Abs: 1.1 10*3/uL (ref 0.7–4.0)
MCH: 35.7 pg — ABNORMAL HIGH (ref 26.0–34.0)
MCHC: 35.4 g/dL (ref 30.0–36.0)
MCV: 101 fL — ABNORMAL HIGH (ref 80.0–100.0)
Monocytes Absolute: 0.7 10*3/uL (ref 0.1–1.0)
Monocytes Relative: 10 %
Neutro Abs: 5.3 10*3/uL (ref 1.7–7.7)
Neutrophils Relative %: 71 %
Platelets: 253 10*3/uL (ref 150–400)
RBC: 4.17 MIL/uL — ABNORMAL LOW (ref 4.22–5.81)
RDW: 12.1 % (ref 11.5–15.5)
WBC: 7.5 10*3/uL (ref 4.0–10.5)
nRBC: 0 % (ref 0.0–0.2)

## 2020-02-17 LAB — URINE DRUG SCREEN, QUALITATIVE (ARMC ONLY)
Amphetamines, Ur Screen: POSITIVE — AB
Barbiturates, Ur Screen: NOT DETECTED
Benzodiazepine, Ur Scrn: NOT DETECTED
Cannabinoid 50 Ng, Ur ~~LOC~~: NOT DETECTED
Cocaine Metabolite,Ur ~~LOC~~: POSITIVE — AB
MDMA (Ecstasy)Ur Screen: NOT DETECTED
Methadone Scn, Ur: NOT DETECTED
Opiate, Ur Screen: NOT DETECTED
Phencyclidine (PCP) Ur S: NOT DETECTED
Tricyclic, Ur Screen: NOT DETECTED

## 2020-02-17 LAB — COMPREHENSIVE METABOLIC PANEL
ALT: 12 U/L (ref 0–44)
AST: 21 U/L (ref 15–41)
Albumin: 3.8 g/dL (ref 3.5–5.0)
Alkaline Phosphatase: 108 U/L (ref 38–126)
Anion gap: 8 (ref 5–15)
BUN: 9 mg/dL (ref 8–23)
CO2: 26 mmol/L (ref 22–32)
Calcium: 10.3 mg/dL (ref 8.9–10.3)
Chloride: 98 mmol/L (ref 98–111)
Creatinine, Ser: 0.98 mg/dL (ref 0.61–1.24)
GFR calc Af Amer: >60 mL/min (ref >60)
GFR calc non Af Amer: >60 mL/min (ref >60)
Glucose, Bld: 116 mg/dL — ABNORMAL HIGH (ref 70–99)
Potassium: 4.8 mmol/L (ref 3.5–5.1)
Sodium: 132 mmol/L — ABNORMAL LOW (ref 135–145)
Total Bilirubin: 0.4 mg/dL (ref 0.3–1.2)
Total Protein: 7.5 g/dL (ref 6.5–8.1)

## 2020-02-17 LAB — ETHANOL: Alcohol, Ethyl (B): <10 mg/dL (ref <10)

## 2020-02-17 MED ORDER — LEVETIRACETAM 500 MG PO TABS: 500.0000 mg | ORAL_TABLET | Freq: Two times a day (BID) | ORAL | 0 refills | Status: DC

## 2020-02-17 MED ORDER — LEVETIRACETAM IN NACL 1000 MG/100ML IV SOLN
1000.0000 mg | Freq: Once | INTRAVENOUS | Status: AC
  Administered 2020-02-17: 1000 mg via INTRAVENOUS
  Filled 2020-02-17: qty 100

## 2020-02-17 NOTE — ED Notes (Signed)
Patient lives with with brother and [employer]. Cousins phone number is no in chart  Debby Freiberg- [phone]

## 2020-02-17 NOTE — ED Notes (Signed)
RN has called Loistine Chance and is aware that patient is up for discharge and requires transport home. Loistine Chance stated he is unable to drive but patients brother can. Loistine Chance is going to get in touch with brother for transport.   Patient will finish IV Keppra and be D/C to lobby to wait for family to pick up.

## 2020-02-17 NOTE — Discharge Instructions (Addendum)
I am concerned that this was a seizure that you had.  We discussed admission for MRI and EEG but you decided that you prefer to go home.  We are starting you on some Keppra to help prevent seizures.  You should call the neurology number to follow-up with them.  Otherwise if he can get into them you can follow-up at [facility].  Return to the ER for recurrent seizure  IMPRESSION:  No acute abnormality. Stable atrophy and chronic small vessel white  matter ischemic changes.

## 2020-02-17 NOTE — ED Triage Notes (Signed)
Patient arrived via [facility], from home, AOx4 and ambulatory. Patient brother called 911 due to brother stating to EMS that he witnessed patient having stroke like symptoms, "droopy face". EMS arrived, negative stroke symptoms. Patient however stated that brother called 911 due to patient having seizure, RN unsure of chief complaint, ED MD will attempt to call family member living with patient to collect more intel.

## 2020-02-17 NOTE — ED Provider Notes (Signed)
[facility] Emergency Department Provider Note  ____________________________________________   First MD Initiated Contact with Patient 02/17/20 [phone]     (approximate)  I have reviewed the triage vital signs and the nursing notes.   HISTORY  Chief Complaint Hypertension    HPI Zachary Pugh. is a 64 y.o. male with history of alcohol withdrawal who came in for a witnessed seizure at home in August 2020.  The patient patient was started on oral Keppra but I do think he ever followed up.  Patient had another admission back in December 2019 secondary to polysubstance abuse and concern for seizure secondary to substances.  Patient presents today for concern for high blood pressure.  According the patient he only has seizures when his blood pressure goes up high.  Patient states that he uses alcohol occasionally was not used for a few days.  He denies any drug use.  He states that he thinks that his brother called EMS because he thought he might of had a seizure because he was acting a little bit more lethargic with maybe some slurred speech or facial droop.  However on EMS arrival they did not notice any slurred speech or facial droop.  A stroke scale was 0.  According to patient he is not sure what medications he is on.  He stated that we needed to talk to his brother.  At this time he feels that his normal self.  I attempted to call patient's brother but he did not pick up and there was no voicemail that I could leave.    Past Medical History:  Diagnosis Date  . Seizures (HCC)   . Stroke [facility])     Patient Active Problem List   Diagnosis Date Noted  . Alcohol withdrawal (HCC) 11/28/2018    No past surgical history on file.  Prior to Admission medications   Medication Sig Start Date End Date Taking? Authorizing Provider  amLODipine (NORVASC) 5 MG tablet Take 1 tablet (5 mg total) by mouth daily. Patient not taking: Reported on 07/27/2019 11/29/18  07/27/19  Enid Baas, MD  levETIRAcetam (KEPPRA) 500 MG tablet Take 1 tablet (500 mg total) by mouth 2 (two) times daily. 07/27/19   Emily Filbert, MD    Allergies Patient has no known allergies.  No family history on file.  Social History Social History   Tobacco Use  . Smoking status: Current Every Day Smoker    Packs/day: 1.00  . Smokeless tobacco: Never Used  Substance Use Topics  . Alcohol use: Yes  . Drug use: Never      Review of Systems Constitutional: No fever/chills Eyes: No visual changes. ENT: No sore throat. Cardiovascular: Denies chest pain. Respiratory: Denies shortness of breath. Gastrointestinal: No abdominal pain.  No nausea, no vomiting.  No diarrhea.  No constipation. Genitourinary: Negative for dysuria. Musculoskeletal: Negative for back pain. Skin: Negative for rash. Neurological: Negative for headaches, focal weakness or numbness.  Concern for not acting his normal self earlier today but now at baseline. All other ROS negative ____________________________________________   PHYSICAL EXAM:  VITAL SIGNS: Blood pressure 138/83, pulse (!) 102, temperature 98.2 F (36.8 C), temperature source Oral, resp. rate 14, height 5\' 9"  (1.753 m), weight 59 kg, SpO2 99 %.   Constitutional: Alert and oriented. Well appearing and in no acute distress. Eyes: Conjunctivae are normal. EOMI. Head: Atraumatic. Nose: No congestion/rhinnorhea. Mouth/Throat: Mucous membranes are moist.   Neck: No stridor. Trachea Midline. FROM Cardiovascular: Normal  rate, regular rhythm. Grossly normal heart sounds.  Good peripheral circulation. Respiratory: Normal respiratory effort.  No retractions. Lungs CTAB. Gastrointestinal: Soft and nontender. No distention. No abdominal bruits.  Musculoskeletal: No lower extremity tenderness nor edema.  No joint effusions. Neurologic: Cranial nerves II through XII are intact.  No pronator drift.  No shaking noted.  Equal  strength in legs.  Sensation intact Skin:  Skin is warm, dry and intact. No rash noted. Psychiatric: Mood and affect are normal. Speech and behavior are normal. GU: Deferred   ____________________________________________   LABS (all labs ordered are listed, but only abnormal results are displayed)  Labs Reviewed  URINALYSIS, ROUTINE W REFLEX MICROSCOPIC - Abnormal; Notable for the following components:      Result Value   Color, Urine YELLOW (*)    APPearance CLEAR (*)    All other components within normal limits  URINE DRUG SCREEN, QUALITATIVE ([facility] ONLY) - Abnormal; Notable for the following components:   Amphetamines, Ur Screen POSITIVE (*)    Cocaine Metabolite,Ur [place] POSITIVE (*)    All other components within normal limits  CBC WITH DIFFERENTIAL/PLATELET - Abnormal; Notable for the following components:   RBC 4.17 (*)    MCV 101.0 (*)    MCH 35.7 (*)    All other components within normal limits  COMPREHENSIVE METABOLIC PANEL - Abnormal; Notable for the following components:   Sodium 132 (*)    Glucose, Bld 116 (*)    All other components within normal limits  ETHANOL   ____________________________________________   ED ECG REPORT I, Vanessa Urbana, the attending physician, personally viewed and interpreted this ECG.  EKG is sinus rate of 94, no ST elevation, no T wave inversions, normal intervals ____________________________________________  RADIOLOGY   Official radiology report(s): CT Head Wo Contrast  Result Date: 02/17/2020 CLINICAL DATA:  Possible seizure and possible stroke like symptoms with facial drooping. No persistent symptoms at this time. EXAM: CT HEAD WITHOUT CONTRAST TECHNIQUE: Contiguous axial images were obtained from the base of the skull through the vertex without intravenous contrast. COMPARISON:  11/28/2018 FINDINGS: Brain: No significant change in mild-to-moderate enlargement of the ventricles and subarachnoid spaces. Stable moderate patchy white  matter low density in both cerebral hemispheres. No intracranial hemorrhage, mass lesion or CT evidence of acute infarction. Vascular: No hyperdense vessel or unexpected calcification. Skull: Normal. Negative for fracture or focal lesion. Sinuses/Orbits: Unremarkable. Other: None. IMPRESSION: No acute abnormality. Stable atrophy and chronic small vessel white matter ischemic changes. Electronically Signed   By: Claudie Revering M.D.   On: 02/17/2020 10:43    ____________________________________________   PROCEDURES  Procedure(s) performed (including Critical Care):  Procedures   ____________________________________________   INITIAL IMPRESSION / ASSESSMENT AND PLAN / ED COURSE  Zachary Pugh. was evaluated in Emergency Department on 02/17/2020 for the symptoms described in the history of present illness. He was evaluated in the context of the global COVID-19 pandemic, which necessitated consideration that the patient might be at risk for infection with the SARS-CoV-2 virus that causes COVID-19. Institutional protocols and algorithms that pertain to the evaluation of patients at risk for COVID-19 are in a state of rapid change based on information released by regulatory bodies including the CDC and federal and state organizations. These policies and algorithms were followed during the patient's care in the ED.    Patient is a 64 year old gentleman with history of substance abuse who comes in for concern for altered mental status this morning.  I am  unable to get a hold of the brother to get collateral information.  We will continue to try to get in contact with him.  In the meantime given not sure exactly what happened will get CT head just make sure there is no evidence of intracranial hemorrhage, mass.  Will get electrolytes to evaluate electrolyte abnormalities.  Will get urine to evaluate for UTI.  Patient stroke scale is 0 at this time.  We will do closely monitor.  Labs reviewed and  reassuring.  UA without evidence of UTI.  Patient does not look like he is activ is ely withdrawing at this time.  No tongue tremors or hand tremors.  His alcohol was negative although he was positive for amphetamines and cocaine which could've caused an altered mental status this morning.  His CT head shows stable atrophy and chronic small vessel.  No signs of arrhythmia on EKG. patient was started on Keppra back in August by one of the ER doctors just in case this was due to epileptic seizures.  Thing is very hard to tell exactly what is causing these episodes of specialist I can get any collateral information on exactly what happened.  1:10 PM attempting to contact family and unable to get a hold of them.  Unable to get in contact with the cousin Loistine Chance who patient lives with.  According to Loistine Chance he had about 15 seconds of full body shaking and that they saw him shaking and they were able to lower him to the ground.  Afterwards they noted that he seemed more confused and had some arm stiffening so they called EMS.  It sounds like he came back to baseline within 15 minutes.  They were unclear if he urinated on himself.  Discussed with patient the story and that I was concerned that this could be a seizure although could be secondary to the substances that he was positive for in regards to his positive cocaine and methamphetamines.  We discussed admission and discussion with neurology for MRI, EEG given patient's age the patient states that he is not interested in being admitted at this time. We discussed that seizures could be a sign of stroke vs mass vs primary seizure disorder.  He understands the risk including death and permanent disability.  Patient does not look intoxicated at this time and seems to be able to make his own medical decisions. He does not appear to be withdrawing. No tremors noted. We discussed giving him a dose of IV Keppra and starting him on some Keppra that he should follow-up with  neurology outpatient.  Patient seems to have the capacity to make this decision and is able to understand the risk of not staying in the hospital.  Patient understands that if he has another seizure he should return to the ER for further work-up.  I discussed the provisional nature of ED diagnosis, the treatment so far, the ongoing plan of care, follow up appointments and return precautions with the patient and any family or support people present. They expressed understanding and agreed with the plan, discharged home.  ____________________________________________   FINAL CLINICAL IMPRESSION(S) / ED DIAGNOSES   Final diagnoses:  Seizure (HCC)      MEDICATIONS GIVEN DURING THIS VISIT:  Medications  levETIRAcetam (KEPPRA) IVPB 1000 mg/100 mL premix (1,000 mg Intravenous New Bag/Given 02/17/20 1435)     ED Discharge Orders         Ordered    levETIRAcetam (KEPPRA) 500 MG tablet  2 times  daily     02/17/20 1436           Note:  This document was prepared using Dragon voice recognition software and may include unintentional dictation errors.   Concha Se, MD 02/17/20 [phone]

## 2020-09-12 DEATH — deceased

## 2021-01-18 IMAGING — CT CT HEAD W/O CM
3 series · 16 of 46 positions shown, 19 images · non-contrast
Comparison: 11/28/2018

CLINICAL DATA: Possible seizure and possible stroke like symptoms
with facial drooping. No persistent symptoms at this time.

EXAM:
CT HEAD WITHOUT CONTRAST
TECHNIQUE: Contiguous axial images were obtained from the base of the skull
through the vertex without intravenous contrast.

[Series 3: head wo · axial · 0.40mm/px · z∈[+379,+499]mm · 10 of 29 slices shown, 13 images]
[im 3/29  brain]
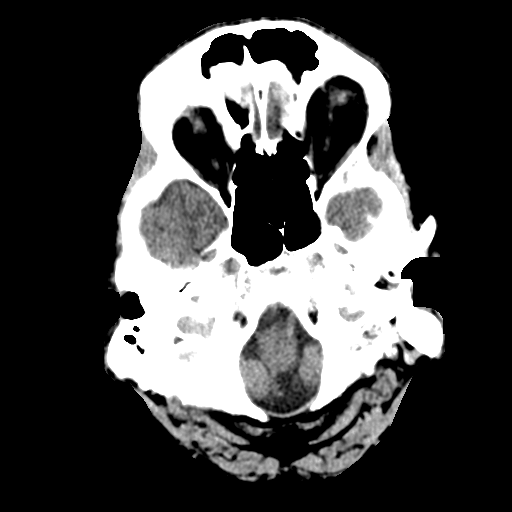
[im 3/29  bone]
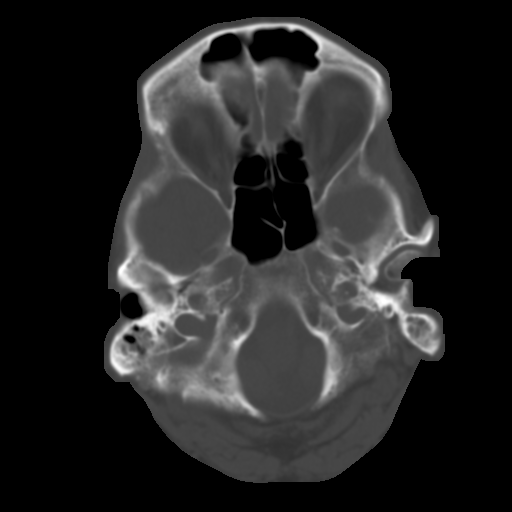
[im 6/29  brain]
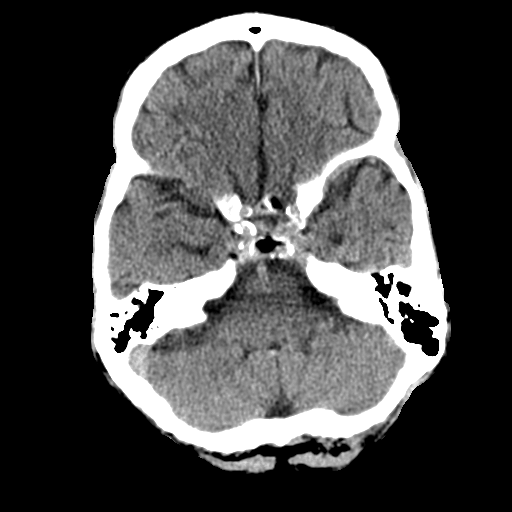
[im 8/29  brain]
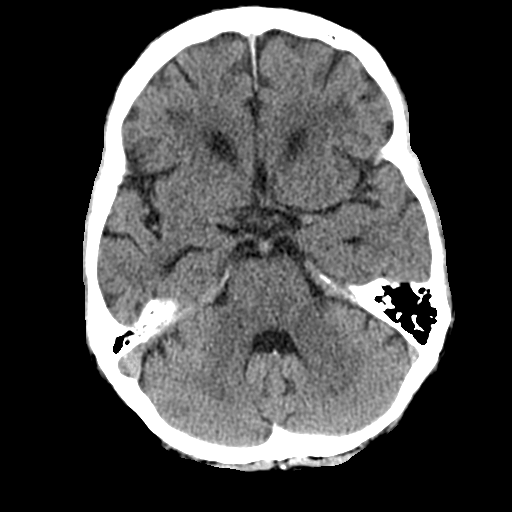
[im 11/29  brain]
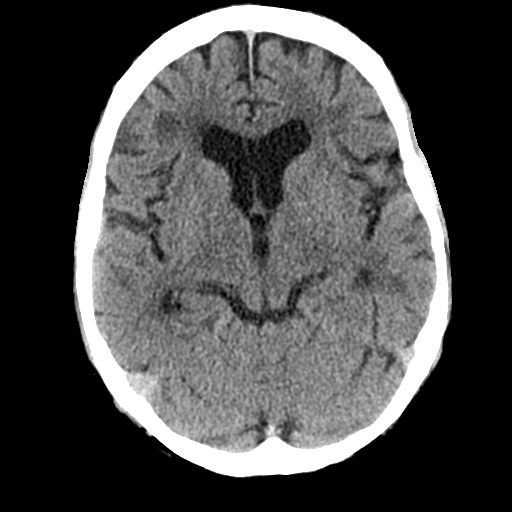
[im 14/29  brain]
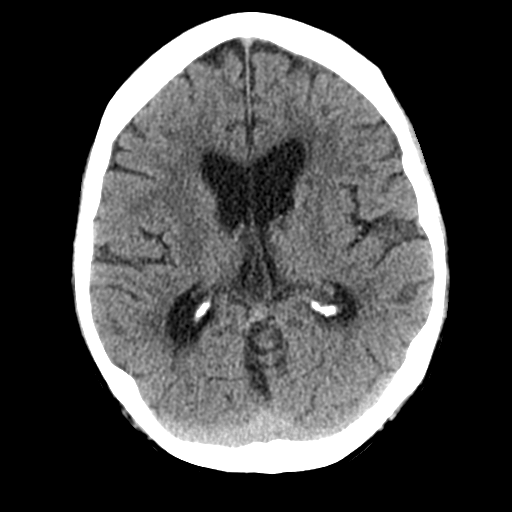
[im 14/29  bone]
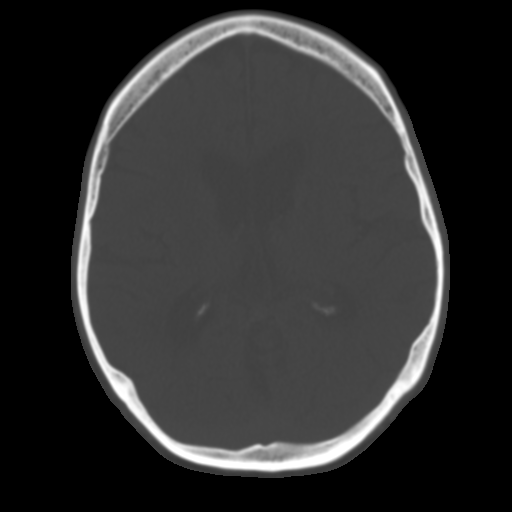
[im 16/29  brain]
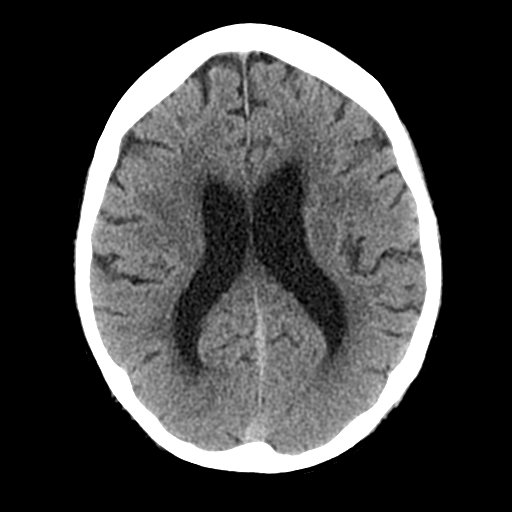
[im 19/29  brain]
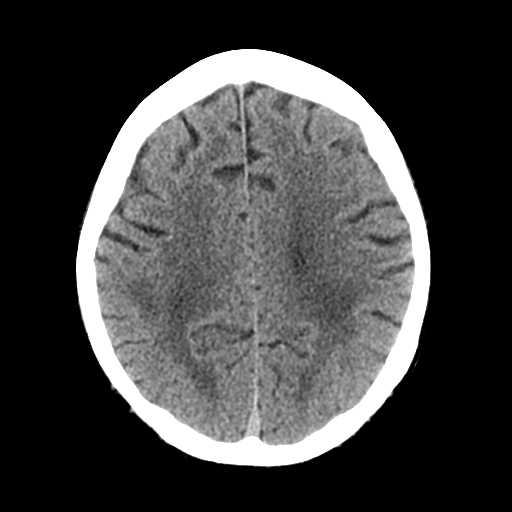
[im 22/29  brain]
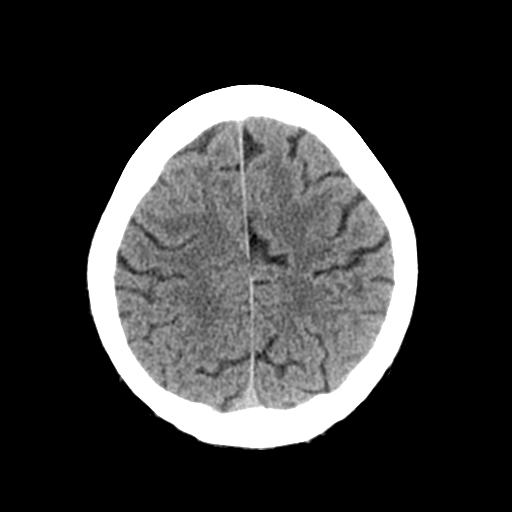
[im 24/29  brain]
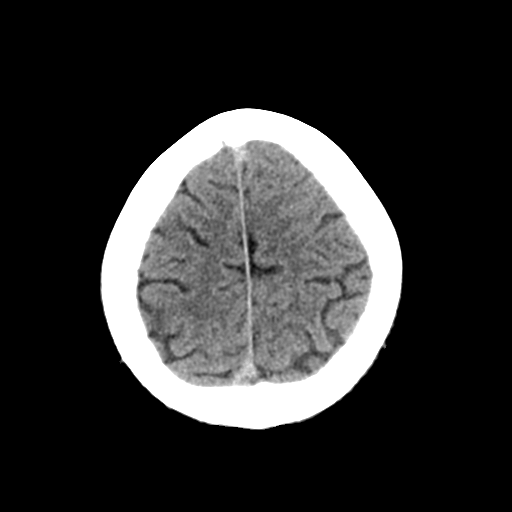
[im 24/29  bone]
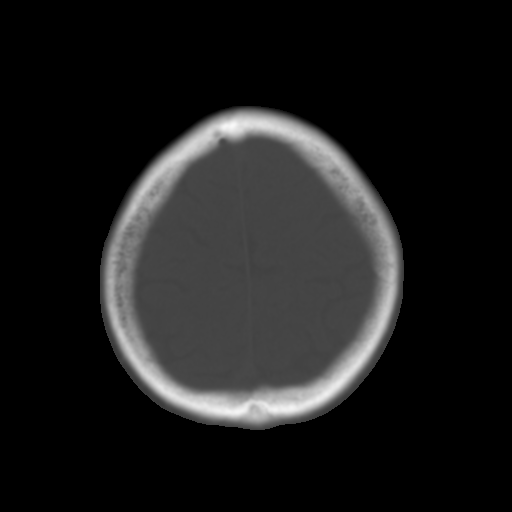
[im 27/29  brain]
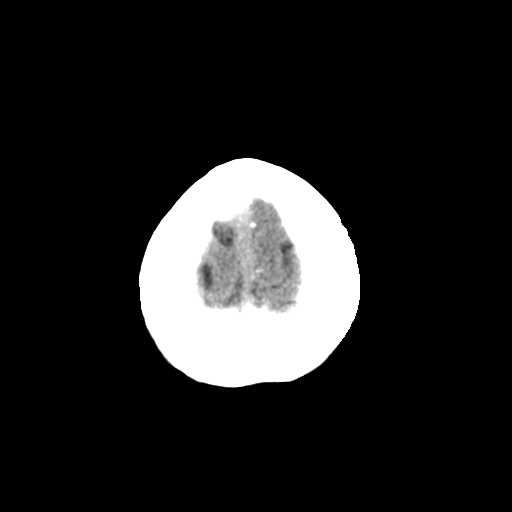

[Series 4: coronal soft tissue · coronal · 0.31mm/px · 3 of 65 slices shown]
[im 22/65  brain]
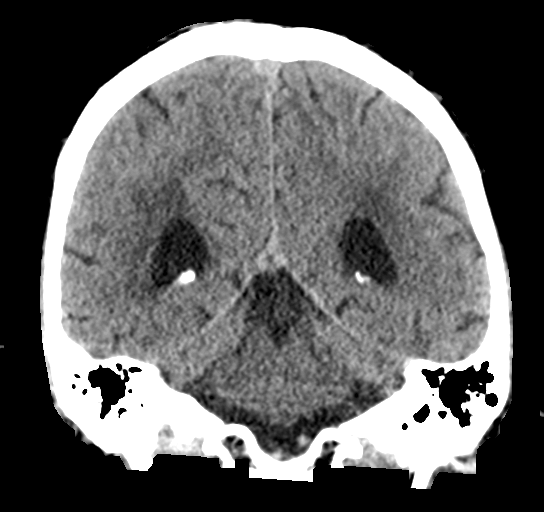
[im 29/65  brain]
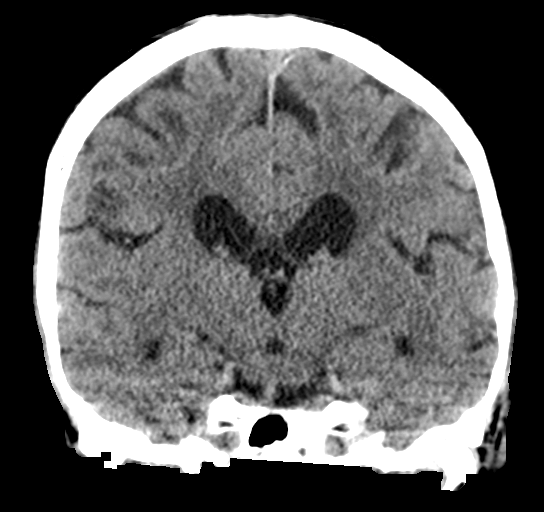
[im 36/65  brain]
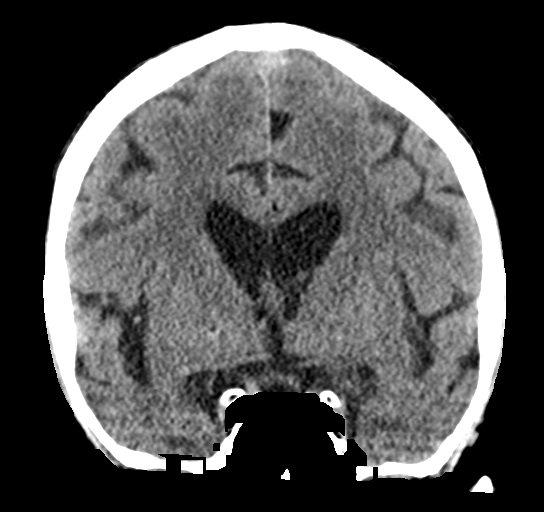

[Series 5: sagittal soft tissue · sagittal · 0.31mm/px · 3 of 54 slices shown]
[im 18/54  brain]
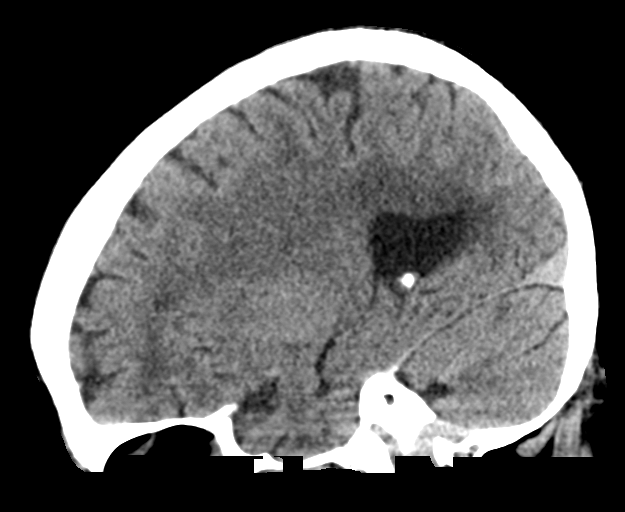
[im 27/54  brain]
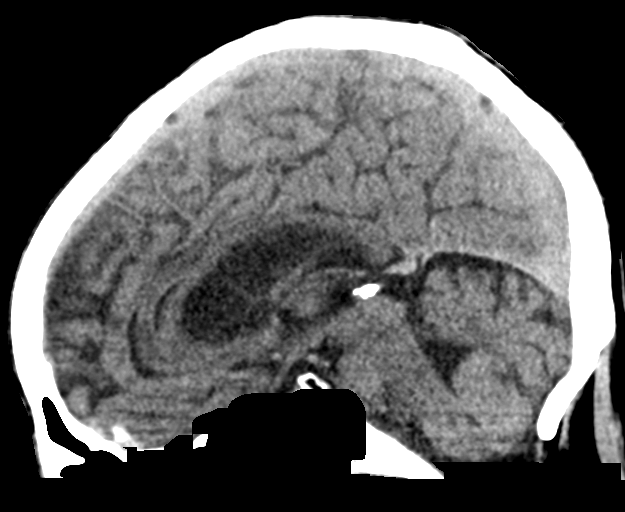
[im 36/54  brain]
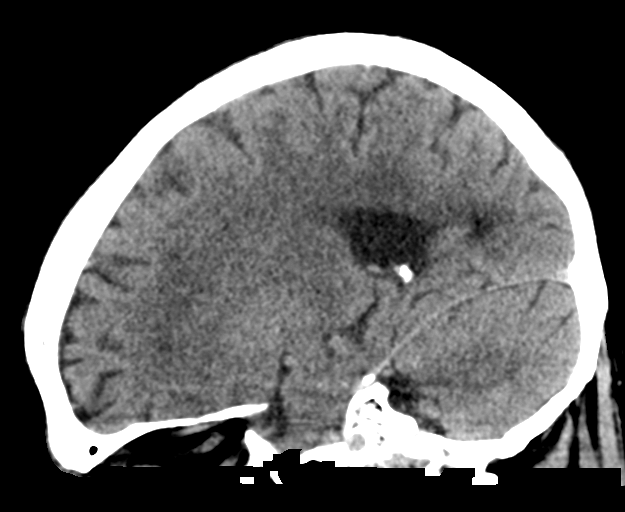

[16 of 46 positions shown; findings below may reference images not displayed]

FINDINGS: Brain: No significant change in mild-to-moderate enlargement of the
ventricles and subarachnoid spaces. Stable moderate patchy white
matter low density in both cerebral hemispheres. No intracranial
hemorrhage, mass lesion or CT evidence of acute infarction.

Vascular: No hyperdense vessel or unexpected calcification.

Skull: Normal. Negative for fracture or focal lesion.

Sinuses/Orbits: Unremarkable.

Other: None.
IMPRESSION: No acute abnormality. Stable atrophy and chronic small vessel white
matter ischemic changes.

## 2022-09-27 ENCOUNTER — Emergency Department
Admission: EM | Admit: 2022-09-27 | Discharge: 2022-09-27 | Disposition: A | Discharge status: Home / Self Care | Payer: Medicare HMO | Attending: Emergency Medicine | Admitting: Emergency Medicine

## 2022-09-27 ENCOUNTER — Encounter: Payer: Self-pay | Admitting: Emergency Medicine

## 2022-09-27 ENCOUNTER — Emergency Department: Payer: Medicare HMO

## 2022-09-27 DIAGNOSIS — R569 Unspecified convulsions: Secondary | ICD-10-CM | POA: Diagnosis not present

## 2022-09-27 DIAGNOSIS — S29001A Unspecified injury of muscle and tendon of front wall of thorax, initial encounter: Secondary | ICD-10-CM | POA: Diagnosis present

## 2022-09-27 DIAGNOSIS — X58XXXA Exposure to other specified factors, initial encounter: Secondary | ICD-10-CM | POA: Insufficient documentation

## 2022-09-27 DIAGNOSIS — I700 Atherosclerosis of aorta: Secondary | ICD-10-CM | POA: Diagnosis not present

## 2022-09-27 DIAGNOSIS — S299XXA Unspecified injury of thorax, initial encounter: Secondary | ICD-10-CM | POA: Diagnosis not present

## 2022-09-27 DIAGNOSIS — M545 Low back pain, unspecified: Secondary | ICD-10-CM | POA: Diagnosis not present

## 2022-09-27 DIAGNOSIS — S20212A Contusion of left front wall of thorax, initial encounter: Secondary | ICD-10-CM | POA: Diagnosis not present

## 2022-09-27 DIAGNOSIS — J439 Emphysema, unspecified: Secondary | ICD-10-CM | POA: Diagnosis not present

## 2022-09-27 MED ORDER — ONDANSETRON 4 MG PO TBDP
4.0000 mg | ORAL_TABLET | Freq: Once | ORAL | Status: AC
  Administered 2022-09-27: 4 mg via ORAL

## 2022-09-27 MED ORDER — ONDANSETRON 4 MG PO TBDP
ORAL_TABLET | ORAL | Status: AC
  Filled 2022-09-27: qty 1

## 2022-09-27 MED ORDER — LEVETIRACETAM 500 MG PO TABS: 500.0000 mg | ORAL_TABLET | Freq: Two times a day (BID) | ORAL | 1 refills | Status: AC

## 2022-09-27 MED ORDER — LEVETIRACETAM 500 MG PO TABS
500.0000 mg | ORAL_TABLET | Freq: Once | ORAL | Status: AC
  Administered 2022-09-27: 500 mg via ORAL
  Filled 2022-09-27: qty 1

## 2022-09-27 NOTE — ED Triage Notes (Signed)
Pt to ED via [facility] from home. Pt states that he his not sure why he is here. Upon questioning pt states that he is having back pain. According to previous note from when pt first got here, he has had multiple seizures over the last 24 hours. Pt is currently in NAD.

## 2022-09-27 NOTE — ED Provider Triage Note (Signed)
Emergency Medicine Provider Triage Evaluation Note  Zachary Pugh. , a 66 y.o. male  was evaluated in triage.  Pt complains of nonspecific medical complaint. Per EMS report he is here after having seizure activity and left side back pain.  Physical Exam  BP (!) 139/91 (BP Location: Left Arm)   Pulse 85   Temp 98.5 F (36.9 C) (Oral)   Resp 18   Ht 5\' 9"  (1.753 m)   Wt 57.2 kg   SpO2 96%   BMI 18.61 kg/m  Gen:   Awake, no distress   Resp:  Normal effort  MSK:   Moves extremities without difficulty  Other:    Medical Decision Making  Medically screening exam initiated at 12:33 PM.  Appropriate orders placed.  Judene Companion. was informed that the remainder of the evaluation will be completed by another provider, this initial triage assessment does not replace that evaluation, and the importance of remaining in the ED until their evaluation is complete.   Victorino Dike, FNP 09/27/22 1238

## 2022-09-27 NOTE — ED Provider Notes (Signed)
[facility] Provider Note    Event Date/Time   First MD Initiated Contact with Patient 09/27/22 1359     (approximate)   History   Back Pain   HPI  Zachary Pugh. is a 66 y.o. male  With history of polysubstance abuse as well as seizures who has historically refused to take any sort of seizure medication presents after reported seizure with pain in his left lateral chest.  He is concerned that he may have bruised or cracked his ribs.  No other injuries reported.      Physical Exam   Triage Vital Signs: ED Triage Vitals  Enc Vitals Group     BP 09/27/22 1221 (!) 139/91     Pulse Rate 09/27/22 1221 85     Resp 09/27/22 1221 18     Temp 09/27/22 1221 98.5 F (36.9 C)     Temp Source 09/27/22 1221 Oral     SpO2 09/27/22 1221 96 %     Weight 09/27/22 1221 57.2 kg (126 lb)     Height 09/27/22 1221 1.753 m (5\' 9" )     Head Circumference --      Peak Flow --      Pain Score 09/27/22 1227 7     Pain Loc --      Pain Edu? --      Excl. in GC? --     Most recent vital signs: Vitals:   09/27/22 1221  BP: (!) 139/91  Pulse: 85  Resp: 18  Temp: 98.5 F (36.9 C)  SpO2: 96%     General: Awake, no distress.  CV:  Good peripheral perfusion.  Mild tenderness left lateral chest wall, no clear step-off or swelling Resp:  Normal effort.  Abd:  No distention.  Other:     ED Results / Procedures / Treatments   Labs (all labs ordered are listed, but only abnormal results are displayed) Labs Reviewed - No data to display   EKG     RADIOLOGY Rib x-ray viewed and interpreted by me, no acute abnormality    PROCEDURES:  Critical Care performed:   Procedures   MEDICATIONS ORDERED IN ED: Medications  levETIRAcetam (KEPPRA) tablet 500 mg (500 mg Oral Given 09/27/22 1409)     IMPRESSION / MDM / ASSESSMENT AND PLAN / ED COURSE  I reviewed the triage vital signs and the nursing notes. Patient's presentation is most consistent  with acute presentation with potential threat to life or bodily function.  Patient presents after reported seizure, it sounds like this is not uncommon for him.  He has historically refused seizure medications but notes that he is willing to start today.  I have told him that he will need to follow-up with neurology as well.  He is primarily here for evaluation of left rib pain status post seizure/fall.  Likely was x-ray is reassuring, suspect chest wall contusion  We will start the patient on Keppra as we do not want to lose this opportunity to start him on antiepileptics he will follow-up with neurology, return precautions discussed        FINAL CLINICAL IMPRESSION(S) / ED DIAGNOSES   Final diagnoses:  Chest wall contusion, left, initial encounter  Seizure (HCC)     Rx / DC Orders   ED Discharge Orders          Ordered    levETIRAcetam (KEPPRA) 500 MG tablet  2 times daily  09/27/22 1452             Note:  This document was prepared using Dragon voice recognition software and may include unintentional dictation errors.   Lavonia Drafts, MD 09/27/22 1452

## 2022-09-27 NOTE — ED Triage Notes (Signed)
Arrived by EMS from home for seizures. Reports he does not take medications for them. Last seizure this AM. Reports 3 during the night and the last one at 4am. C/o left side back pain. Denies LOC or hitting head.   EMS 160/94 b/p 98% RA 64P 91CBG

## 2022-10-16 ENCOUNTER — Inpatient Hospital Stay: Payer: Medicare HMO | Admitting: Certified Registered"

## 2022-10-16 ENCOUNTER — Other Ambulatory Visit: Payer: Self-pay

## 2022-10-16 ENCOUNTER — Encounter: Payer: Self-pay | Admitting: Internal Medicine

## 2022-10-16 ENCOUNTER — Inpatient Hospital Stay: Payer: Medicare HMO

## 2022-10-16 ENCOUNTER — Encounter
Admission: EM | Disposition: A | Discharge status: Home / Self Care | Payer: Self-pay | Source: Home / Self Care | Attending: Internal Medicine

## 2022-10-16 ENCOUNTER — Emergency Department: Payer: Medicare HMO

## 2022-10-16 ENCOUNTER — Inpatient Hospital Stay
Admission: EM | Admit: 2022-10-16 | Discharge: 2022-10-26 | DRG: 480 | Disposition: A | Discharge status: Home / Self Care | Payer: Medicare HMO | Attending: Internal Medicine | Admitting: Internal Medicine

## 2022-10-16 DIAGNOSIS — E43 Unspecified severe protein-calorie malnutrition: Secondary | ICD-10-CM | POA: Diagnosis not present

## 2022-10-16 DIAGNOSIS — S72009A Fracture of unspecified part of neck of unspecified femur, initial encounter for closed fracture: Secondary | ICD-10-CM | POA: Diagnosis present

## 2022-10-16 DIAGNOSIS — S72032A Displaced midcervical fracture of left femur, initial encounter for closed fracture: Principal | ICD-10-CM | POA: Diagnosis present

## 2022-10-16 DIAGNOSIS — F17210 Nicotine dependence, cigarettes, uncomplicated: Secondary | ICD-10-CM | POA: Diagnosis present

## 2022-10-16 DIAGNOSIS — Z79899 Other long term (current) drug therapy: Secondary | ICD-10-CM

## 2022-10-16 DIAGNOSIS — G8929 Other chronic pain: Secondary | ICD-10-CM | POA: Diagnosis present

## 2022-10-16 DIAGNOSIS — F1010 Alcohol abuse, uncomplicated: Secondary | ICD-10-CM | POA: Diagnosis present

## 2022-10-16 DIAGNOSIS — W010XXA Fall on same level from slipping, tripping and stumbling without subsequent striking against object, initial encounter: Secondary | ICD-10-CM | POA: Diagnosis present

## 2022-10-16 DIAGNOSIS — Y92018 Other place in single-family (private) house as the place of occurrence of the external cause: Secondary | ICD-10-CM

## 2022-10-16 DIAGNOSIS — S0990XA Unspecified injury of head, initial encounter: Secondary | ICD-10-CM | POA: Diagnosis not present

## 2022-10-16 DIAGNOSIS — Z720 Tobacco use: Secondary | ICD-10-CM | POA: Insufficient documentation

## 2022-10-16 DIAGNOSIS — I10 Essential (primary) hypertension: Secondary | ICD-10-CM | POA: Diagnosis not present

## 2022-10-16 DIAGNOSIS — R001 Bradycardia, unspecified: Secondary | ICD-10-CM | POA: Diagnosis not present

## 2022-10-16 DIAGNOSIS — Z681 Body mass index (BMI) 19 or less, adult: Secondary | ICD-10-CM

## 2022-10-16 DIAGNOSIS — E871 Hypo-osmolality and hyponatremia: Secondary | ICD-10-CM | POA: Diagnosis not present

## 2022-10-16 DIAGNOSIS — F1410 Cocaine abuse, uncomplicated: Secondary | ICD-10-CM | POA: Diagnosis present

## 2022-10-16 DIAGNOSIS — R64 Cachexia: Secondary | ICD-10-CM | POA: Diagnosis present

## 2022-10-16 DIAGNOSIS — Z043 Encounter for examination and observation following other accident: Secondary | ICD-10-CM | POA: Diagnosis not present

## 2022-10-16 DIAGNOSIS — R451 Restlessness and agitation: Secondary | ICD-10-CM | POA: Diagnosis not present

## 2022-10-16 DIAGNOSIS — Z8673 Personal history of transient ischemic attack (TIA), and cerebral infarction without residual deficits: Secondary | ICD-10-CM | POA: Diagnosis not present

## 2022-10-16 DIAGNOSIS — M545 Low back pain, unspecified: Secondary | ICD-10-CM | POA: Diagnosis present

## 2022-10-16 DIAGNOSIS — S72002A Fracture of unspecified part of neck of left femur, initial encounter for closed fracture: Secondary | ICD-10-CM | POA: Diagnosis not present

## 2022-10-16 DIAGNOSIS — W109XXA Fall (on) (from) unspecified stairs and steps, initial encounter: Secondary | ICD-10-CM | POA: Diagnosis not present

## 2022-10-16 DIAGNOSIS — D649 Anemia, unspecified: Secondary | ICD-10-CM | POA: Diagnosis present

## 2022-10-16 DIAGNOSIS — S72035A Nondisplaced midcervical fracture of left femur, initial encounter for closed fracture: Secondary | ICD-10-CM | POA: Diagnosis not present

## 2022-10-16 DIAGNOSIS — R9431 Abnormal electrocardiogram [ECG] [EKG]: Secondary | ICD-10-CM | POA: Diagnosis not present

## 2022-10-16 DIAGNOSIS — M79672 Pain in left foot: Secondary | ICD-10-CM | POA: Diagnosis not present

## 2022-10-16 DIAGNOSIS — W19XXXA Unspecified fall, initial encounter: Secondary | ICD-10-CM | POA: Diagnosis not present

## 2022-10-16 DIAGNOSIS — S299XXA Unspecified injury of thorax, initial encounter: Secondary | ICD-10-CM | POA: Diagnosis not present

## 2022-10-16 DIAGNOSIS — Z01810 Encounter for preprocedural cardiovascular examination: Secondary | ICD-10-CM | POA: Diagnosis not present

## 2022-10-16 DIAGNOSIS — Y9301 Activity, walking, marching and hiking: Secondary | ICD-10-CM | POA: Diagnosis present

## 2022-10-16 DIAGNOSIS — S72002D Fracture of unspecified part of neck of left femur, subsequent encounter for closed fracture with routine healing: Secondary | ICD-10-CM | POA: Diagnosis not present

## 2022-10-16 DIAGNOSIS — G40909 Epilepsy, unspecified, not intractable, without status epilepticus: Secondary | ICD-10-CM | POA: Diagnosis not present

## 2022-10-16 HISTORY — PX: HIP PINNING,CANNULATED: SHX1758

## 2022-10-16 LAB — COMPREHENSIVE METABOLIC PANEL
ALT: 18 U/L (ref 0–44)
AST: 23 U/L (ref 15–41)
Albumin: 3.5 g/dL (ref 3.5–5.0)
Alkaline Phosphatase: 98 U/L (ref 38–126)
Anion gap: 10 (ref 5–15)
BUN: 14 mg/dL (ref 8–23)
CO2: 23 mmol/L (ref 22–32)
Calcium: 8.9 mg/dL (ref 8.9–10.3)
Chloride: 94 mmol/L — ABNORMAL LOW (ref 98–111)
Creatinine, Ser: 1.07 mg/dL (ref 0.61–1.24)
GFR, Estimated: >60 mL/min (ref >60)
Glucose, Bld: 101 mg/dL — ABNORMAL HIGH (ref 70–99)
Potassium: 4.4 mmol/L (ref 3.5–5.1)
Sodium: 127 mmol/L — ABNORMAL LOW (ref 135–145)
Total Bilirubin: 1.2 mg/dL (ref 0.3–1.2)
Total Protein: 7.3 g/dL (ref 6.5–8.1)

## 2022-10-16 LAB — CBC WITH DIFFERENTIAL/PLATELET
Abs Immature Granulocytes: 0.02 10*3/uL (ref 0.00–0.07)
Basophils Absolute: 0 10*3/uL (ref 0.0–0.1)
Basophils Relative: 0 %
Eosinophils Absolute: 0 10*3/uL (ref 0.0–0.5)
Eosinophils Relative: 0 %
HCT: 37.9 % — ABNORMAL LOW (ref 39.0–52.0)
Hemoglobin: 13.3 g/dL (ref 13.0–17.0)
Immature Granulocytes: 0 %
Lymphocytes Relative: 9 %
Lymphs Abs: 0.7 10*3/uL (ref 0.7–4.0)
MCH: 34 pg (ref 26.0–34.0)
MCHC: 35.1 g/dL (ref 30.0–36.0)
MCV: 96.9 fL (ref 80.0–100.0)
Monocytes Absolute: 0.9 10*3/uL (ref 0.1–1.0)
Monocytes Relative: 12 %
Neutro Abs: 5.6 10*3/uL (ref 1.7–7.7)
Neutrophils Relative %: 79 %
Platelets: 202 10*3/uL (ref 150–400)
RBC: 3.91 MIL/uL — ABNORMAL LOW (ref 4.22–5.81)
RDW: 11.9 % (ref 11.5–15.5)
WBC: 7.2 10*3/uL (ref 4.0–10.5)
nRBC: 0 % (ref 0.0–0.2)

## 2022-10-16 LAB — URINE DRUG SCREEN, QUALITATIVE (ARMC ONLY)
Amphetamines, Ur Screen: NOT DETECTED
Barbiturates, Ur Screen: NOT DETECTED
Benzodiazepine, Ur Scrn: NOT DETECTED
Cannabinoid 50 Ng, Ur ~~LOC~~: NOT DETECTED
Cocaine Metabolite,Ur ~~LOC~~: POSITIVE — AB
MDMA (Ecstasy)Ur Screen: NOT DETECTED
Methadone Scn, Ur: NOT DETECTED
Opiate, Ur Screen: NOT DETECTED
Phencyclidine (PCP) Ur S: NOT DETECTED
Tricyclic, Ur Screen: NOT DETECTED

## 2022-10-16 LAB — MAGNESIUM: Magnesium: 2.1 mg/dL (ref 1.7–2.4)

## 2022-10-16 LAB — PROTIME-INR
INR: 1 (ref 0.8–1.2)
Prothrombin Time: 12.5 seconds (ref 11.4–15.2)

## 2022-10-16 LAB — APTT: aPTT: 43 seconds — ABNORMAL HIGH (ref 24–36)

## 2022-10-16 SURGERY — FIXATION, FEMUR, NECK, PERCUTANEOUS, USING SCREW
Anesthesia: General | Site: Hip | Laterality: Left

## 2022-10-16 MED ORDER — DEXAMETHASONE SODIUM PHOSPHATE 10 MG/ML IJ SOLN
INTRAMUSCULAR | Status: DC | PRN
  Administered 2022-10-16: 10 mg via INTRAVENOUS

## 2022-10-16 MED ORDER — 0.9 % SODIUM CHLORIDE (POUR BTL) OPTIME
TOPICAL | Status: DC | PRN
  Administered 2022-10-16: 1000 mL

## 2022-10-16 MED ORDER — LIDOCAINE HCL (CARDIAC) PF 100 MG/5ML IV SOSY
PREFILLED_SYRINGE | INTRAVENOUS | Status: DC | PRN
  Administered 2022-10-16: 100 mg via INTRAVENOUS

## 2022-10-16 MED ORDER — SODIUM CHLORIDE 0.9 % IV BOLUS
1000.0000 mL | Freq: Once | INTRAVENOUS | Status: AC
  Administered 2022-10-16: 1000 mL via INTRAVENOUS

## 2022-10-16 MED ORDER — FENTANYL CITRATE (PF) 100 MCG/2ML IJ SOLN: 25.0000 ug | INTRAMUSCULAR | Status: DC | PRN

## 2022-10-16 MED ORDER — GLYCOPYRROLATE PF 0.2 MG/ML IJ SOSY
PREFILLED_SYRINGE | INTRAMUSCULAR | Status: DC | PRN
  Administered 2022-10-16: .4 mg via INTRAVENOUS

## 2022-10-16 MED ORDER — BUPIVACAINE HCL (PF) 0.25 % IJ SOLN
INTRAMUSCULAR | Status: DC | PRN
  Administered 2022-10-16: 30 mL

## 2022-10-16 MED ORDER — PROPOFOL 500 MG/50ML IV EMUL
INTRAVENOUS | Status: DC | PRN
  Administered 2022-10-16: 100 ug/kg/min via INTRAVENOUS

## 2022-10-16 MED ORDER — POLYETHYLENE GLYCOL 3350 17 G PO PACK: 17.0000 g | PACK | Freq: Every day | ORAL | Status: DC | PRN

## 2022-10-16 MED ORDER — SODIUM CHLORIDE 0.9 % IV SOLN: INTRAVENOUS | Status: DC

## 2022-10-16 MED ORDER — CEFAZOLIN SODIUM-DEXTROSE 2-4 GM/100ML-% IV SOLN
INTRAVENOUS | Status: AC
  Filled 2022-10-16: qty 100

## 2022-10-16 MED ORDER — ENOXAPARIN SODIUM 40 MG/0.4ML IJ SOSY: 40.0000 mg | PREFILLED_SYRINGE | INTRAMUSCULAR | Status: DC

## 2022-10-16 MED ORDER — LACTATED RINGERS IV SOLN: INTRAVENOUS | Status: DC | PRN

## 2022-10-16 MED ORDER — PROPOFOL 1000 MG/100ML IV EMUL
INTRAVENOUS | Status: AC
  Filled 2022-10-16: qty 100

## 2022-10-16 MED ORDER — FOLIC ACID 1 MG PO TABS
1.0000 mg | ORAL_TABLET | Freq: Every day | ORAL | Status: DC
  Administered 2022-10-17 – 2022-10-26 (×10): 1 mg via ORAL
  Filled 2022-10-16 (×10): qty 1

## 2022-10-16 MED ORDER — METHOCARBAMOL 1000 MG/10ML IJ SOLN: 500.0000 mg | Freq: Four times a day (QID) | INTRAVENOUS | Status: DC | PRN

## 2022-10-16 MED ORDER — HYDROCODONE-ACETAMINOPHEN 5-325 MG PO TABS: 1.0000 | ORAL_TABLET | Freq: Four times a day (QID) | ORAL | Status: DC | PRN

## 2022-10-16 MED ORDER — LEVETIRACETAM 500 MG PO TABS
500.0000 mg | ORAL_TABLET | Freq: Two times a day (BID) | ORAL | Status: DC
  Administered 2022-10-16 – 2022-10-26 (×20): 500 mg via ORAL
  Filled 2022-10-16 (×21): qty 1

## 2022-10-16 MED ORDER — LORAZEPAM 1 MG PO TABS
1.0000 mg | ORAL_TABLET | ORAL | Status: AC | PRN
  Administered 2022-10-17: 2 mg via ORAL
  Filled 2022-10-16: qty 2

## 2022-10-16 MED ORDER — KETAMINE HCL 50 MG/5ML IJ SOSY
PREFILLED_SYRINGE | INTRAMUSCULAR | Status: AC
  Filled 2022-10-16: qty 5

## 2022-10-16 MED ORDER — ACETAMINOPHEN 500 MG PO TABS
500.0000 mg | ORAL_TABLET | Freq: Four times a day (QID) | ORAL | Status: AC
  Administered 2022-10-16 – 2022-10-17 (×4): 500 mg via ORAL
  Filled 2022-10-16 (×4): qty 1

## 2022-10-16 MED ORDER — LIDOCAINE HCL (PF) 2 % IJ SOLN
INTRAMUSCULAR | Status: AC
  Filled 2022-10-16: qty 5

## 2022-10-16 MED ORDER — PROPOFOL 10 MG/ML IV BOLUS
INTRAVENOUS | Status: DC | PRN
  Administered 2022-10-16 (×2): 50 mg via INTRAVENOUS

## 2022-10-16 MED ORDER — LORAZEPAM 2 MG/ML IJ SOLN: 1.0000 mg | INTRAMUSCULAR | Status: AC | PRN

## 2022-10-16 MED ORDER — ACETAMINOPHEN 500 MG PO TABS
1000.0000 mg | ORAL_TABLET | Freq: Once | ORAL | Status: AC
  Administered 2022-10-16: 1000 mg via ORAL
  Filled 2022-10-16: qty 2

## 2022-10-16 MED ORDER — DOCUSATE SODIUM 100 MG PO CAPS
100.0000 mg | ORAL_CAPSULE | Freq: Two times a day (BID) | ORAL | Status: DC
  Administered 2022-10-16 – 2022-10-26 (×20): 100 mg via ORAL
  Filled 2022-10-16 (×20): qty 1

## 2022-10-16 MED ORDER — BUPIVACAINE HCL (PF) 0.25 % IJ SOLN
INTRAMUSCULAR | Status: AC
  Filled 2022-10-16: qty 30

## 2022-10-16 MED ORDER — METHOCARBAMOL 500 MG PO TABS
500.0000 mg | ORAL_TABLET | Freq: Four times a day (QID) | ORAL | Status: DC | PRN
  Administered 2022-10-18 – 2022-10-22 (×2): 500 mg via ORAL
  Filled 2022-10-16 (×2): qty 1

## 2022-10-16 MED ORDER — ENOXAPARIN SODIUM 30 MG/0.3ML IJ SOSY
30.0000 mg | PREFILLED_SYRINGE | INTRAMUSCULAR | Status: DC
  Administered 2022-10-17 – 2022-10-23 (×7): 30 mg via SUBCUTANEOUS
  Filled 2022-10-16 (×7): qty 0.3

## 2022-10-16 MED ORDER — CEFAZOLIN SODIUM-DEXTROSE 2-4 GM/100ML-% IV SOLN
2.0000 g | INTRAVENOUS | Status: AC
  Administered 2022-10-16: 2 g via INTRAVENOUS

## 2022-10-16 MED ORDER — KETAMINE HCL 50 MG/ML IJ SOLN
INTRAMUSCULAR | Status: DC | PRN
  Administered 2022-10-16 (×2): 25 mg via INTRAMUSCULAR

## 2022-10-16 MED ORDER — HYDROCODONE-ACETAMINOPHEN 5-325 MG PO TABS
1.0000 | ORAL_TABLET | ORAL | Status: DC | PRN
  Administered 2022-10-18 – 2022-10-19 (×3): 1 via ORAL
  Filled 2022-10-16 (×3): qty 1

## 2022-10-16 MED ORDER — PROPOFOL 10 MG/ML IV BOLUS
INTRAVENOUS | Status: AC
  Filled 2022-10-16: qty 20

## 2022-10-16 MED ORDER — ADULT MULTIVITAMIN W/MINERALS CH
1.0000 | ORAL_TABLET | Freq: Every day | ORAL | Status: DC
  Administered 2022-10-17 – 2022-10-26 (×10): 1 via ORAL
  Filled 2022-10-16 (×10): qty 1

## 2022-10-16 MED ORDER — CEFAZOLIN SODIUM-DEXTROSE 2-4 GM/100ML-% IV SOLN
2.0000 g | Freq: Four times a day (QID) | INTRAVENOUS | Status: AC
  Administered 2022-10-16 – 2022-10-17 (×2): 2 g via INTRAVENOUS
  Filled 2022-10-16 (×3): qty 100

## 2022-10-16 MED ORDER — THIAMINE HCL 100 MG/ML IJ SOLN: 100.0000 mg | Freq: Every day | INTRAMUSCULAR | Status: DC

## 2022-10-16 MED ORDER — DEXAMETHASONE SODIUM PHOSPHATE 10 MG/ML IJ SOLN
INTRAMUSCULAR | Status: AC
  Filled 2022-10-16: qty 1

## 2022-10-16 MED ORDER — THIAMINE HCL 100 MG/ML IJ SOLN: 100.0000 mg | Freq: Once | INTRAMUSCULAR | Status: DC

## 2022-10-16 MED ORDER — ONDANSETRON HCL 4 MG/2ML IJ SOLN: 4.0000 mg | Freq: Once | INTRAMUSCULAR | Status: DC | PRN

## 2022-10-16 MED ORDER — EPHEDRINE SULFATE (PRESSORS) 50 MG/ML IJ SOLN
INTRAMUSCULAR | Status: DC | PRN
  Administered 2022-10-16 (×2): 5 mg via INTRAVENOUS

## 2022-10-16 MED ORDER — ONDANSETRON HCL 4 MG/2ML IJ SOLN
INTRAMUSCULAR | Status: DC | PRN
  Administered 2022-10-16: 4 mg via INTRAVENOUS

## 2022-10-16 MED ORDER — ONDANSETRON HCL 4 MG/2ML IJ SOLN: 4.0000 mg | Freq: Four times a day (QID) | INTRAMUSCULAR | Status: DC | PRN

## 2022-10-16 MED ORDER — BISACODYL 10 MG RE SUPP: 10.0000 mg | Freq: Every day | RECTAL | Status: DC | PRN

## 2022-10-16 MED ORDER — ALUM & MAG HYDROXIDE-SIMETH 200-200-20 MG/5ML PO SUSP: 30.0000 mL | ORAL | Status: DC | PRN

## 2022-10-16 MED ORDER — PHENYLEPHRINE HCL (PRESSORS) 10 MG/ML IV SOLN
INTRAVENOUS | Status: DC | PRN
  Administered 2022-10-16: 80 ug via INTRAVENOUS
  Administered 2022-10-16 (×2): 40 ug via INTRAVENOUS
  Administered 2022-10-16: 160 ug via INTRAVENOUS

## 2022-10-16 MED ORDER — DEXMEDETOMIDINE HCL IN NACL 80 MCG/20ML IV SOLN
INTRAVENOUS | Status: AC
  Filled 2022-10-16: qty 20

## 2022-10-16 MED ORDER — DEXMEDETOMIDINE HCL IN NACL 200 MCG/50ML IV SOLN
INTRAVENOUS | Status: DC | PRN
  Administered 2022-10-16: .5 ug/kg/h via INTRAVENOUS

## 2022-10-16 MED ORDER — ONDANSETRON HCL 4 MG/2ML IJ SOLN
INTRAMUSCULAR | Status: AC
  Filled 2022-10-16: qty 2

## 2022-10-16 MED ORDER — TRAMADOL HCL 50 MG PO TABS
50.0000 mg | ORAL_TABLET | Freq: Four times a day (QID) | ORAL | Status: DC
  Administered 2022-10-16 – 2022-10-26 (×31): 50 mg via ORAL
  Filled 2022-10-16 (×33): qty 1

## 2022-10-16 MED ORDER — ONDANSETRON HCL 4 MG PO TABS: 4.0000 mg | ORAL_TABLET | Freq: Four times a day (QID) | ORAL | Status: DC | PRN

## 2022-10-16 MED ORDER — ACETAMINOPHEN 325 MG PO TABS: 325.0000 mg | ORAL_TABLET | Freq: Four times a day (QID) | ORAL | Status: DC | PRN

## 2022-10-16 MED ORDER — THIAMINE MONONITRATE 100 MG PO TABS
100.0000 mg | ORAL_TABLET | Freq: Every day | ORAL | Status: DC
  Administered 2022-10-17 – 2022-10-26 (×10): 100 mg via ORAL
  Filled 2022-10-16 (×10): qty 1

## 2022-10-16 MED ORDER — MORPHINE SULFATE (PF) 2 MG/ML IV SOLN: 0.5000 mg | INTRAVENOUS | Status: DC | PRN

## 2022-10-16 MED ORDER — THIAMINE HCL 100 MG/ML IJ SOLN
100.0000 mg | Freq: Once | INTRAMUSCULAR | Status: AC
  Administered 2022-10-16: 100 mg via INTRAVENOUS
  Filled 2022-10-16: qty 2

## 2022-10-16 MED ORDER — SENNA 8.6 MG PO TABS
1.0000 | ORAL_TABLET | Freq: Two times a day (BID) | ORAL | Status: DC
  Administered 2022-10-16 – 2022-10-26 (×20): 8.6 mg via ORAL
  Filled 2022-10-16 (×20): qty 1

## 2022-10-16 SURGICAL SUPPLY — 32 items
BIT DRILL CANN LRG QC 5X300 (BIT) IMPLANT
BLADE SURG SZ10 CARB STEEL (BLADE) ×1 IMPLANT
BNDG COHESIVE 6X5 TAN ST LF (GAUZE/BANDAGES/DRESSINGS) ×2 IMPLANT
DRAPE SURG 17X11 SM STRL (DRAPES) ×2 IMPLANT
DRAPE U-SHAPE 47X51 STRL (DRAPES) ×1 IMPLANT
DRSG OPSITE POSTOP 3X4 (GAUZE/BANDAGES/DRESSINGS) IMPLANT
DRSG XEROFORM 1X8 (GAUZE/BANDAGES/DRESSINGS) IMPLANT
DURAPREP 26ML APPLICATOR (WOUND CARE) ×2 IMPLANT
ELECT REM PT RETURN 9FT ADLT (ELECTROSURGICAL) ×1
ELECTRODE REM PT RTRN 9FT ADLT (ELECTROSURGICAL) ×1 IMPLANT
GLOVE BIOGEL PI IND STRL 9 (GLOVE) ×1 IMPLANT
GLOVE BIOGEL PI ORTHO SZ9 (GLOVE) ×4 IMPLANT
GOWN STRL REUS TWL 2XL XL LVL4 (GOWN DISPOSABLE) ×1 IMPLANT
GOWN STRL REUS W/ TWL LRG LVL3 (GOWN DISPOSABLE) ×1 IMPLANT
GOWN STRL REUS W/TWL LRG LVL3 (GOWN DISPOSABLE) ×1
GUIDEWIRE THREADED 2.8 (WIRE) IMPLANT
MANIFOLD NEPTUNE II (INSTRUMENTS) ×1 IMPLANT
MAT ABSORB  FLUID 56X50 GRAY (MISCELLANEOUS) ×1
MAT ABSORB FLUID 56X50 GRAY (MISCELLANEOUS) ×1 IMPLANT
NS IRRIG 1000ML POUR BTL (IV SOLUTION) ×1 IMPLANT
PACK HIP COMPR (MISCELLANEOUS) ×1 IMPLANT
SCREW CANN 16 THRD/85 6.5 (Screw) IMPLANT
SCREW CANN 16 THRD/90 6.5 (Screw) IMPLANT
SCREW CANN 16 THRD/95 6.5 (Screw) IMPLANT
SCREW CANN 6.5X100 32MM THRD (Screw) IMPLANT
SCREW CANN 6.5X95 (Screw) IMPLANT
STAPLER SKIN PROX 35W (STAPLE) ×1 IMPLANT
STRAP SAFETY 5IN WIDE (MISCELLANEOUS) ×1 IMPLANT
SUT VIC AB 0 CT1 36 (SUTURE) ×1 IMPLANT
SUT VIC AB 2-0 CT2 27 (SUTURE) ×1 IMPLANT
TRAP FLUID SMOKE EVACUATOR (MISCELLANEOUS) ×1 IMPLANT
WATER STERILE IRR 500ML POUR (IV SOLUTION) ×1 IMPLANT

## 2022-10-16 NOTE — Transfer of Care (Signed)
Immediate Anesthesia Transfer of Care Note  Patient: Cutler Sunday.  Procedure(s) Performed: PERCUTANEOUS FIXATION OF FEMORAL NECK (Left: Hip)  Patient Location: PACU  Anesthesia Type:MAC  Level of Consciousness: drowsy  Airway & Oxygen Therapy: Patient Spontanous Breathing and Patient connected to nasal cannula oxygen  Post-op Assessment: Report given to RN and Post -op Vital signs reviewed and stable  Post vital signs: Reviewed and stable  Last Vitals:  Vitals Value Taken Time  BP    Temp    Pulse    Resp    SpO2      Last Pain:  Vitals:   10/16/22 1045  TempSrc: Oral  PainSc: Asleep         Complications: No notable events documented.

## 2022-10-16 NOTE — H&P (Signed)
History and Physical    Patient: Zachary Pugh. KDT:[id number] DOB: 03/10/1956 DOA: 10/16/2022 DOS: the patient was seen and examined on 10/16/2022 PCP: [facility]  Patient coming from: Home  Chief Complaint:  Chief Complaint  Patient presents with   Hip Pain   HPI: Zachary Pugh. is a 66 y.o. male with medical history significant of seizure disorder, tobacco and alcohol abuse, cocaine abuse who present to the hospital with hip fracture. Patient states that he does not have any symptoms at baseline except lower back pain.  He drinks 6-7 beers a day, he had a fall when he walk down the steps after drinking alcohol, sustained a hip fracture. Orthopedics consult is obtained from emergency room, patient is scheduled to have surgery.  Review of Systems: As mentioned in the history of present illness. All other systems reviewed and are negative. Past Medical History:  Diagnosis Date   History of cocaine use 02/17/2020   UA positive for Cocaine   Positive urine drug screen 02/17/2020   Cocaine and Amphetamines   Seizures (HCC)    Stroke [facility])    History reviewed. No pertinent surgical history. Social History:  reports that he has been smoking. He has been smoking an average of 1 pack per day. He has never used smokeless tobacco. He reports current alcohol use. He reports that he does not use drugs.  No Known Allergies  History reviewed. No pertinent family history.  Prior to Admission medications   Medication Sig Start Date End Date Taking? Authorizing Provider  amLODipine (NORVASC) 5 MG tablet Take 1 tablet (5 mg total) by mouth daily. Patient not taking: Reported on 07/27/2019 11/29/18 07/27/19  Enid Baas, MD  levETIRAcetam (KEPPRA) 500 MG tablet Take 1 tablet (500 mg total) by mouth 2 (two) times daily. 09/27/22   Jene Every, MD    Physical Exam: Vitals:   10/16/22 0647 10/16/22 1000 10/16/22 1045 10/16/22 1143  BP: (!) 152/107 139/88 130/87  128/79  Pulse: (!) 107 89 85 86  Resp: 18 16 16 17   Temp: 98.6 F (37 C) 98.2 F (36.8 C) 98.2 F (36.8 C) 97.8 F (36.6 C)  TempSrc: Oral  Oral   SpO2: 97% 99% 99% 100%  Weight:      Height:       Physical Exam Constitutional:      General: He is not in acute distress.    Appearance: He is not toxic-appearing.     Comments: Appear malnourished with significant muscle atrophy.  HENT:     Head: Normocephalic and atraumatic.     Nose: Nose normal.     Mouth/Throat:     Mouth: Mucous membranes are moist.     Pharynx: Oropharynx is clear.  Eyes:     Conjunctiva/sclera: Conjunctivae normal.     Pupils: Pupils are equal, round, and reactive to light.  Cardiovascular:     Rate and Rhythm: Normal rate and regular rhythm.     Heart sounds: No murmur heard. Pulmonary:     Effort: Pulmonary effort is normal. No respiratory distress.     Breath sounds: Normal breath sounds.  Abdominal:     General: Abdomen is flat. Bowel sounds are normal. There is no distension.     Palpations: Abdomen is soft.     Tenderness: There is no abdominal tenderness.  Musculoskeletal:        General: No swelling or tenderness. Normal range of motion.     Cervical back: Normal  range of motion and neck supple. No rigidity.  Lymphadenopathy:     Cervical: No cervical adenopathy.  Skin:    General: Skin is warm and dry.     Coloration: Skin is not jaundiced.  Neurological:     General: No focal deficit present.     Mental Status: He is alert and oriented to person, place, and time.     Cranial Nerves: No cranial nerve deficit.  Psychiatric:        Mood and Affect: Mood normal.        Behavior: Behavior normal.     Data Reviewed:  Reviewed CT head and CT neck and hip. Reviewed all lab results.  Assessment and Plan: Acute hip fracture. Patient is scheduled to have surgery today.  Patient does not have any heart condition, no additional work-up is needed before surgery We will follow closely  before and after surgery  Hyponatremia. Alcohol abuse. Hyponatremia appears to be secondary to alcohol use. Patient is n.p.o., will start normal saline at 50 mL/h.  Recheck BMP tomorrow. Start CIWA protocol.  Severe protein calorie malnutrition. Patient appears severely malnourished with BMI of 18.46 with muscle atrophy. Patient currently n.p.o., will start protein supplement postop.  Cocaine abuse. Tobacco abuse. Advised to quit.  Patient still positive for cocaine in urine drug screen, this would be a risk factor for surgery.    Advance Care Planning:   Code Status: Full Code Full  Consults: Orthopedics  Family Communication:   Severity of Illness: Patient will be admitted to the inpatient, he will be in the hospital for more than 2 days.  He will have scheduled inpatient surgery.  Author: Sharen Hones, MD 10/16/2022 12:01 PM  For on call review [email or web address].

## 2022-10-16 NOTE — Consult Note (Signed)
ORTHOPAEDIC CONSULTATION  REQUESTING PHYSICIAN: Sharen Hones, MD  Chief Complaint: left hip pain status post fall  HPI: Zachary Pugh. is a 66 y.o. male with a history of alcohol and cocaine abuse who complains of left hip pain status post fall on Thursday.  Patient is reported to have fallen down stairs after drinking alcohol.  Patient reports drinking 6-7 beers a day.  He presented to the [facility] with left hip pain and x-rays revealed a minimally displaced left femoral neck hip fracture.  Patient was admitted the hospital service for preoperative evaluation.  The [facility] is consulted for management of his left hip fracture.  Past Medical History:  Diagnosis Date   History of cocaine use 02/17/2020   UA positive for Cocaine   Positive urine drug screen 02/17/2020   Cocaine and Amphetamines   Seizures ([place])    Stroke [facility])    History reviewed. No pertinent surgical history. Social History   Socioeconomic History   Marital status: Single    Spouse name: Not on file   Number of children: Not on file   Years of education: Not on file   Highest education level: Not on file  Occupational History   Not on file  Tobacco Use   Smoking status: Every Day    Packs/day: 1.00    Types: Cigarettes   Smokeless tobacco: Never  Vaping Use   Vaping Use: Never used  Substance and Sexual Activity   Alcohol use: Yes    Comment: 2-3 beers, 2-3 times a week   Drug use: Never   Sexual activity: Not Currently  Other Topics Concern   Not on file  Social History Narrative   Not on file   Social Determinants of Health   Financial Resource Strain: Not on file  Food Insecurity: Not on file  Transportation Needs: Not on file  Physical Activity: Not on file  Stress: Not on file  Social Connections: Not on file   History reviewed. No pertinent family history. No Known Allergies Prior to Admission medications   Medication Sig Start Date End Date Taking?  Authorizing Provider  amLODipine (NORVASC) 5 MG tablet Take 1 tablet (5 mg total) by mouth daily. Patient not taking: Reported on 07/27/2019 11/29/18 07/27/19  Gladstone Lighter, MD  levETIRAcetam (KEPPRA) 500 MG tablet Take 1 tablet (500 mg total) by mouth 2 (two) times daily. 09/27/22   Lavonia Drafts, MD   CT HIP LEFT WO CONTRAST  Result Date: 10/16/2022 CLINICAL DATA:  Left hip fracture EXAM: CT OF THE LEFT HIP WITHOUT CONTRAST TECHNIQUE: Multidetector CT imaging of the left hip was performed according to the standard protocol. Multiplanar CT image reconstructions were also generated. RADIATION DOSE REDUCTION: This exam was performed according to the departmental dose-optimization program which includes automated exposure control, adjustment of the mA and/or kV according to patient size and/or use of iterative reconstruction technique. COMPARISON:  Same-day x-ray FINDINGS: Bones/Joint/Cartilage Acute transcervical fracture of the left femoral neck with approximately 1 cm of proximal migration and impaction. Mild anterior apex angulation. No evidence of fracture involvement in the intertrochanteric region. Hip joint alignment is maintained without dislocation. Bony pelvis intact without fracture or diastasis. No lytic or sclerotic bone lesion. Mild joint space narrowing of both hips. Ligaments Suboptimally assessed by CT. Muscles and Tendons Asymmetric fullness of the left iliopsoas musculature which may be due to muscle strain and small intramuscular hematoma. Soft tissues Soft tissue swelling/edema at the fracture site. No organized fluid collection.  No left inguinal lymphadenopathy. Atherosclerotic vascular calcifications. IMPRESSION: 1. Acute mildly displaced and angulated left femoral neck fracture. 2. Asymmetric fullness of the left iliopsoas musculature which may be due to muscle strain and small intramuscular hematoma. Electronically Signed   By: Duanne Guess D.O.   On: 10/16/2022 09:25   CT  HEAD WO CONTRAST ( )  Result Date: 10/16/2022 CLINICAL DATA:  66 year old male history of trauma from a fall. EXAM: CT HEAD WITHOUT CONTRAST CT CERVICAL SPINE WITHOUT CONTRAST TECHNIQUE: Multidetector CT imaging of the head and cervical spine was performed following the standard protocol without intravenous contrast. Multiplanar CT image reconstructions of the cervical spine were also generated. RADIATION DOSE REDUCTION: This exam was performed according to the departmental dose-optimization program which includes automated exposure control, adjustment of the mA and/or kV according to patient size and/or use of iterative reconstruction technique. COMPARISON:  No priors. FINDINGS: CT HEAD FINDINGS Brain: Moderate cerebral and mild cerebellar atrophy. Patchy and confluent areas of decreased attenuation are noted throughout the deep and periventricular white matter of the cerebral hemispheres bilaterally, compatible with chronic microvascular ischemic disease. No evidence of acute infarction, hemorrhage, hydrocephalus, extra-axial collection or mass lesion/mass effect. Vascular: No hyperdense vessel or unexpected calcification. Skull: Normal. Negative for fracture or focal lesion. Sinuses/Orbits: No acute finding. Other: None. CT CERVICAL SPINE FINDINGS Alignment: Normal. Skull base and vertebrae: No acute fracture. No primary bone lesion or focal pathologic process. Soft tissues and spinal canal: No prevertebral fluid or swelling. No visible canal hematoma. Disc levels: Multilevel degenerative disc disease most evident at C4-C5, C5-C6 and C6-C7. Mild multilevel facet arthropathy. Upper chest: Extensive emphysema noted in the visualized lung apices. 6 x 4 mm (mean diameter 5 mm) pulmonary nodule in the left upper lobe near the apex (axial image 106 of series 2). Other: None. IMPRESSION: 1. No evidence of significant acute traumatic injury to the skull, brain or cervical spine. 2. Moderate cerebral and mild  cerebellar atrophy with extensive chronic microvascular ischemic changes in the cerebral white matter. 3. Multilevel degenerative disc disease and cervical spondylosis, as above. 4. Advanced emphysematous changes noted in the lung apices. There is also a pulmonary nodule in the left upper lobe with a mean diameter of 5 mm. Follow-up noncontrast chest CT is recommended in 12 months to ensure the stability or regression of this finding.This recommendation follows the consensus statement: Guidelines for Management of Incidental Pulmonary Nodules Detected on CT Images: From the Fleischner Society 2017; Radiology 2017; 284:228-243. Electronically Signed   By: Trudie Reed M.D.   On: 10/16/2022 08:17   CT Cervical Spine Wo Contrast  Result Date: 10/16/2022 CLINICAL DATA:  66 year old male history of trauma from a fall. EXAM: CT HEAD WITHOUT CONTRAST CT CERVICAL SPINE WITHOUT CONTRAST TECHNIQUE: Multidetector CT imaging of the head and cervical spine was performed following the standard protocol without intravenous contrast. Multiplanar CT image reconstructions of the cervical spine were also generated. RADIATION DOSE REDUCTION: This exam was performed according to the departmental dose-optimization program which includes automated exposure control, adjustment of the mA and/or kV according to patient size and/or use of iterative reconstruction technique. COMPARISON:  No priors. FINDINGS: CT HEAD FINDINGS Brain: Moderate cerebral and mild cerebellar atrophy. Patchy and confluent areas of decreased attenuation are noted throughout the deep and periventricular white matter of the cerebral hemispheres bilaterally, compatible with chronic microvascular ischemic disease. No evidence of acute infarction, hemorrhage, hydrocephalus, extra-axial collection or mass lesion/mass effect. Vascular: No hyperdense vessel or unexpected calcification. Skull: Normal.  Negative for fracture or focal lesion. Sinuses/Orbits: No acute  finding. Other: None. CT CERVICAL SPINE FINDINGS Alignment: Normal. Skull base and vertebrae: No acute fracture. No primary bone lesion or focal pathologic process. Soft tissues and spinal canal: No prevertebral fluid or swelling. No visible canal hematoma. Disc levels: Multilevel degenerative disc disease most evident at C4-C5, C5-C6 and C6-C7. Mild multilevel facet arthropathy. Upper chest: Extensive emphysema noted in the visualized lung apices. 6 x 4 mm (mean diameter 5 mm) pulmonary nodule in the left upper lobe near the apex (axial image 106 of series 2). Other: None. IMPRESSION: 1. No evidence of significant acute traumatic injury to the skull, brain or cervical spine. 2. Moderate cerebral and mild cerebellar atrophy with extensive chronic microvascular ischemic changes in the cerebral white matter. 3. Multilevel degenerative disc disease and cervical spondylosis, as above. 4. Advanced emphysematous changes noted in the lung apices. There is also a pulmonary nodule in the left upper lobe with a mean diameter of 5 mm. Follow-up noncontrast chest CT is recommended in 12 months to ensure the stability or regression of this finding.This recommendation follows the consensus statement: Guidelines for Management of Incidental Pulmonary Nodules Detected on CT Images: From the Fleischner Society 2017; Radiology 2017; 284:228-243. Electronically Signed   By: Trudie Reed M.D.   On: 10/16/2022 08:17   DG Chest Port 1 View  Result Date: 10/16/2022 CLINICAL DATA:  66 year old male with history of trauma from a fall. EXAM: PORTABLE CHEST 1 VIEW COMPARISON:  Chest x-ray 09/27/2022. FINDINGS: Lung volumes are normal. No consolidative airspace disease. No pleural effusions. No pneumothorax. No pulmonary nodule or mass noted. Pulmonary vasculature and the cardiomediastinal silhouette are within normal limits. Atherosclerosis in the thoracic aorta. IMPRESSION: 1.  No radiographic evidence of acute cardiopulmonary  disease. 2. Aortic atherosclerosis. Electronically Signed   By: Trudie Reed M.D.   On: 10/16/2022 07:24   DG Hip Unilat W or Wo Pelvis 2-3 Views Left  Result Date: 10/16/2022 CLINICAL DATA:  66 year old male with history of left hip pain after recent fall. EXAM: DG HIP (WITH OR WITHOUT PELVIS) 2-3V LEFT COMPARISON:  No priors. FINDINGS: Mildly displaced fracture of the left femoral neck with mild proximal migration of the distal fracture fragment. Left femoral head remains located in the left acetabulum. Bony pelvic ring appears intact. Right proximal femur as visualized appears intact. There is joint space narrowing, subchondral sclerosis, subchondral cyst formation and osteophyte formation in the hip joints bilaterally, indicative of osteoarthritis. IMPRESSION: 1. Acute mildly displaced transcervical left femoral neck fracture, as above. 2. Moderate bilateral hip joint osteoarthritis. Electronically Signed   By: Trudie Reed M.D.   On: 10/16/2022 07:23    Positive ROS: All other systems have been reviewed and were otherwise negative with the exception of those mentioned in the HPI and as above.  Physical Exam: General: Alert, no acute distress  MUSCULOSKELETAL: Left hip: Patient has intact skin.  There is no erythema ecchymosis or significant swelling.  Patient has minimal external rotation of the left lower extremity.  He has no significant shortening.  Patient distally is neurovascular intact with intact motor function.  Assessment: Left minimally displaced femoral neck hip fracture  Plan: I reviewed the patient's x-ray and ordered a CT scan.  Patient has slight external rotation deformity with minimal shortening.  Given the mild deformity to the patient's neck hip fracture, I am recommending closed reduction and percutaneous fixation.  Given the patient's alcoholism and drug use I am concerned that  he would be at increased risk for dislocation of a hemiarthroplasty and potentially  increased risk for prosthetic infection.  I discussed the risks and benefits of surgery. The risks include but are not limited to infection, bleeding requiring blood transfusion, nerve or blood vessel injury, joint stiffness or loss of motion, persistent pain, weakness or instability, malunion, nonunion, avascular necrosis, hardware failure and the need for further surgery including conversion to a left hip hemiarthroplasty. Medical risks include but are not limited to DVT and pulmonary embolism, myocardial infarction, stroke, pneumonia, respiratory failure and death. Patient understood these risks and wished to proceed.   Patient's fracture is now 2 days ago.  Even though the patient has tested positive for cocaine, I feel that patient's surgery is emergent.  Waiting a longer period of time before proceeding with surgery risks further displacement of the patient's fracture and the opportunity fix this percutaneously may be lost.    [name], MD    10/16/2022 2:38 PM

## 2022-10-16 NOTE — ED Triage Notes (Signed)
Pt reports mechanical fall on Thr evening.  Pt reports not walking since then.  Pt reports pain to L hip since then.  RN notes shortening and rotation to L leg, pedal pulse +1 in L foot. Pain is 4/10 while at rest, 10/10 when moving.

## 2022-10-16 NOTE — Op Note (Signed)
10/16/2022  6:20 PM  PATIENT:  Zachary Pugh.    PRE-OPERATIVE DIAGNOSIS:  left femoral neck hip fracture  POST-OPERATIVE DIAGNOSIS:  Same  PROCEDURE:  PERCUTANEOUS FIXATION OF LEFT FEMORAL NECK HIP FRACTURE  SURGEON:  Thornton Park, MD  ANESTHESIA:   Spinal  EBL:  10 cc  PREOPERATIVE INDICATIONS:  [name]. is a  66 y.o. male who fell and was found to have a diagnosis of left hip fracture .  Percutaneous fixation has been recommended for fixation of this mildly displaced hip fracture as determined by x-ray and CT scan.      The risks benefits and alternatives were discussed with the patient preoperatively including but not limited to infection, bleeding, nerve or blood vessel injury, persistent hip pain,  malunion, nonunion, avascular necrosis, change in lower extremity rotation, leg length discrepancy, failure of the hardware and the need for revision surgery, including the potential for conversion to hemi or total hip arthroplasty. Medical risks include but are not limited to: DVT and pulmonary embolism, myocardial infarction, stroke, pneumonia, respiratory failure and death.  The patient understood and agreed with the plan for surgery.  Patient had the left hip marked with the word yes and my initials according the hospitals correct site of surgery protocol.  OPERATIVE IMPLANTS: Synthes stainless steel 6.5 mm cannulated screws x 4  OPERATIVE FINDINGS: Mildly displaced left femoral neck hip fracture  OPERATIVE PROCEDURE: The patient was brought to the operating room and general anesthesia was administered by the anesthesia service.  The patient was then placed supine on the fracture table.  2 g of Ancef IV was administered. The left lower extremity was positioned in a leg holder, without any significant reduction maneuver other than mild internal rotation.  The well leg was placed in hemi-lithotomy position. The hip was prepped and draped in usual sterile fashion.  A time  out was performed to verify the patient's name, date of birth, medical record number, correct site of surgery and correct surger to be performed. The  timeout was also used to verify the patient had received antibiotics that all appropriate instruments, implants and radiographic studies were available in the room. Once all in attendance were in agreement case began.  Once the reduction was deemed near-anatomic by C arm imaging, a small lateral incision was made in line with the femur, distal to the greater trochanter, and 4 threaded guidewires were introduced into the lateral cortex of the femur, across the fracture site and into the femoral head in an diamond configuration. The lengths of these guidepins were measured with a depth gauge. The lateral cortex was then opened with a cannulated drill, and then the cannulated screws were advanced into position and tightened by hand. Solid fixation was achieved.  The guide pins were then removed and final C-arm images were taken of the fracture fixation. The fracture was well reduced and the hardware in good position.  The wound was irrigated copiously, and the deep and subcutaneous tissues were repaired with 0 and 2-0 Vicryl suture respectively and the skin was approximated with staples.  I was scrubbed and present the entire case and all sharp, sponge and instrument counts were correct at the conclusion of the case.  I was unable to reach the patient's brother by phone postoperatively from the PACU.      Timoteo Gaul, MD

## 2022-10-16 NOTE — Progress Notes (Signed)
Subjective:  POST OP CHECK s/p percutaneous screw fixation for left femoral neck hip fracture.   Patient reports left hip pain as moderate.  Patient's family is at the bedside.  Patient is in no acute distress.  Objective:   VITALS:   Vitals:   10/16/22 1850 10/16/22 1858 10/16/22 1900 10/16/22 1906  BP: (!) 105/58 95/61 100/69 105/68  Pulse:      Resp: 17 19 14 15   Temp: (!) 97.3 F (36.3 C)  (!) 97.1 F (36.2 C)   TempSrc:      SpO2: 99% 98% 98% 99%  Weight:      Height:        PHYSICAL EXAM: Left lower extremity Neurovascular intact Sensation intact distally Intact pulses distally Dorsiflexion/Plantar flexion intact Incision: dressing C/D/I No cellulitis present Compartment soft  LABS  Results for orders placed or performed during the hospital encounter of 10/16/22 (from the past 24 hour(s))  Comprehensive metabolic panel     Status: Abnormal   Collection Time: 10/16/22  9:44 AM  Result Value Ref Range   Sodium 127 (L) 135 - 145 mmol/L   Potassium 4.4 3.5 - 5.1 mmol/L   Chloride 94 (L) 98 - 111 mmol/L   CO2 23 22 - 32 mmol/L   Glucose, Bld 101 (H) 70 - 99 mg/dL   BUN 14 8 - 23 mg/dL   Creatinine, Ser 1.07 0.61 - 1.24 mg/dL   Calcium 8.9 8.9 - 10.3 mg/dL   Total Protein 7.3 6.5 - 8.1 g/dL   Albumin 3.5 3.5 - 5.0 g/dL   AST 23 15 - 41 U/L   ALT 18 0 - 44 U/L   Alkaline Phosphatase 98 38 - 126 U/L   Total Bilirubin 1.2 0.3 - 1.2 mg/dL   GFR, Estimated >60 >60 mL/min   Anion gap 10 5 - 15  CBC with Differential     Status: Abnormal   Collection Time: 10/16/22  9:44 AM  Result Value Ref Range   WBC 7.2 4.0 - 10.5 K/uL   RBC 3.91 (L) 4.22 - 5.81 MIL/uL   Hemoglobin 13.3 13.0 - 17.0 g/dL   HCT 37.9 (L) 39.0 - 52.0 %   MCV 96.9 80.0 - 100.0 fL   MCH 34.0 26.0 - 34.0 pg   MCHC 35.1 30.0 - 36.0 g/dL   RDW 11.9 11.5 - 15.5 %   Platelets 202 150 - 400 K/uL   nRBC 0.0 0.0 - 0.2 %   Neutrophils Relative % 79 %   Neutro Abs 5.6 1.7 - 7.7 K/uL   Lymphocytes  Relative 9 %   Lymphs Abs 0.7 0.7 - 4.0 K/uL   Monocytes Relative 12 %   Monocytes Absolute 0.9 0.1 - 1.0 K/uL   Eosinophils Relative 0 %   Eosinophils Absolute 0.0 0.0 - 0.5 K/uL   Basophils Relative 0 %   Basophils Absolute 0.0 0.0 - 0.1 K/uL   Immature Granulocytes 0 %   Abs Immature Granulocytes 0.02 0.00 - 0.07 K/uL  Protime-INR     Status: None   Collection Time: 10/16/22  9:44 AM  Result Value Ref Range   Prothrombin Time 12.5 11.4 - 15.2 seconds   INR 1.0 0.8 - 1.2  APTT     Status: Abnormal   Collection Time: 10/16/22  9:44 AM  Result Value Ref Range   aPTT 43 (H) 24 - 36 seconds  Magnesium     Status: None   Collection Time: 10/16/22  9:44 AM  Result Value Ref Range   Magnesium 2.1 1.7 - 2.4 mg/dL  Urine Drug Screen, Qualitative ([facility] only)     Status: Abnormal   Collection Time: 10/16/22  9:44 AM  Result Value Ref Range   Tricyclic, Ur Screen NONE DETECTED NONE DETECTED   Amphetamines, Ur Screen NONE DETECTED NONE DETECTED   MDMA (Ecstasy)Ur Screen NONE DETECTED NONE DETECTED   Cocaine Metabolite,Ur [place] POSITIVE (A) NONE DETECTED   Opiate, Ur Screen NONE DETECTED NONE DETECTED   Phencyclidine (PCP) Ur S NONE DETECTED NONE DETECTED   Cannabinoid 50 Ng, Ur [place] NONE DETECTED NONE DETECTED   Barbiturates, Ur Screen NONE DETECTED NONE DETECTED   Benzodiazepine, Ur Scrn NONE DETECTED NONE DETECTED   Methadone Scn, Ur NONE DETECTED NONE DETECTED    DG HIP UNILAT WITH PELVIS 2-3 VIEWS LEFT  Result Date: 10/16/2022 CLINICAL DATA:  Closed fracture of left hip. EXAM: DG HIP (WITH OR WITHOUT PELVIS) 2-3V LEFT COMPARISON:  Preoperative imaging. FINDINGS: Six fluoroscopic spot views of the left hip obtained in the operating room. Four cannulated screws traverse the left femoral neck. Fluoroscopy time 1 minutes 3 seconds. Dose 5.4 mGy. IMPRESSION: Intraoperative fluoroscopy during ORIF of left femoral neck fracture. Electronically Signed   By: Keith Rake M.D.   On: 10/16/2022  18:06   DG C-Arm 1-60 Min-No Report  Result Date: 10/16/2022 Fluoroscopy was utilized by the requesting physician.  No radiographic interpretation.   CT HIP LEFT WO CONTRAST  Result Date: 10/16/2022 CLINICAL DATA:  Left hip fracture EXAM: CT OF THE LEFT HIP WITHOUT CONTRAST TECHNIQUE: Multidetector CT imaging of the left hip was performed according to the standard protocol. Multiplanar CT image reconstructions were also generated. RADIATION DOSE REDUCTION: This exam was performed according to the departmental dose-optimization program which includes automated exposure control, adjustment of the mA and/or kV according to patient size and/or use of iterative reconstruction technique. COMPARISON:  Same-day x-ray FINDINGS: Bones/Joint/Cartilage Acute transcervical fracture of the left femoral neck with approximately 1 cm of proximal migration and impaction. Mild anterior apex angulation. No evidence of fracture involvement in the intertrochanteric region. Hip joint alignment is maintained without dislocation. Bony pelvis intact without fracture or diastasis. No lytic or sclerotic bone lesion. Mild joint space narrowing of both hips. Ligaments Suboptimally assessed by CT. Muscles and Tendons Asymmetric fullness of the left iliopsoas musculature which may be due to muscle strain and small intramuscular hematoma. Soft tissues Soft tissue swelling/edema at the fracture site. No organized fluid collection. No left inguinal lymphadenopathy. Atherosclerotic vascular calcifications. IMPRESSION: 1. Acute mildly displaced and angulated left femoral neck fracture. 2. Asymmetric fullness of the left iliopsoas musculature which may be due to muscle strain and small intramuscular hematoma. Electronically Signed   By: Davina Poke D.O.   On: 10/16/2022 09:25   CT HEAD WO CONTRAST (5MM)  Result Date: 10/16/2022 CLINICAL DATA:  66 year old male history of trauma from a fall. EXAM: CT HEAD WITHOUT CONTRAST CT CERVICAL  SPINE WITHOUT CONTRAST TECHNIQUE: Multidetector CT imaging of the head and cervical spine was performed following the standard protocol without intravenous contrast. Multiplanar CT image reconstructions of the cervical spine were also generated. RADIATION DOSE REDUCTION: This exam was performed according to the departmental dose-optimization program which includes automated exposure control, adjustment of the mA and/or kV according to patient size and/or use of iterative reconstruction technique. COMPARISON:  No priors. FINDINGS: CT HEAD FINDINGS Brain: Moderate cerebral and mild cerebellar atrophy. Patchy and confluent areas of decreased attenuation are noted  throughout the deep and periventricular white matter of the cerebral hemispheres bilaterally, compatible with chronic microvascular ischemic disease. No evidence of acute infarction, hemorrhage, hydrocephalus, extra-axial collection or mass lesion/mass effect. Vascular: No hyperdense vessel or unexpected calcification. Skull: Normal. Negative for fracture or focal lesion. Sinuses/Orbits: No acute finding. Other: None. CT CERVICAL SPINE FINDINGS Alignment: Normal. Skull base and vertebrae: No acute fracture. No primary bone lesion or focal pathologic process. Soft tissues and spinal canal: No prevertebral fluid or swelling. No visible canal hematoma. Disc levels: Multilevel degenerative disc disease most evident at C4-C5, C5-C6 and C6-C7. Mild multilevel facet arthropathy. Upper chest: Extensive emphysema noted in the visualized lung apices. 6 x 4 mm (mean diameter 5 mm) pulmonary nodule in the left upper lobe near the apex (axial image 106 of series 2). Other: None. IMPRESSION: 1. No evidence of significant acute traumatic injury to the skull, brain or cervical spine. 2. Moderate cerebral and mild cerebellar atrophy with extensive chronic microvascular ischemic changes in the cerebral white matter. 3. Multilevel degenerative disc disease and cervical  spondylosis, as above. 4. Advanced emphysematous changes noted in the lung apices. There is also a pulmonary nodule in the left upper lobe with a mean diameter of 5 mm. Follow-up noncontrast chest CT is recommended in 12 months to ensure the stability or regression of this finding.This recommendation follows the consensus statement: Guidelines for Management of Incidental Pulmonary Nodules Detected on CT Images: From the Fleischner Society 2017; Radiology 2017; 284:228-243. Electronically Signed   By: Vinnie Langton M.D.   On: 10/16/2022 08:17   CT Cervical Spine Wo Contrast  Result Date: 10/16/2022 CLINICAL DATA:  66 year old male history of trauma from a fall. EXAM: CT HEAD WITHOUT CONTRAST CT CERVICAL SPINE WITHOUT CONTRAST TECHNIQUE: Multidetector CT imaging of the head and cervical spine was performed following the standard protocol without intravenous contrast. Multiplanar CT image reconstructions of the cervical spine were also generated. RADIATION DOSE REDUCTION: This exam was performed according to the departmental dose-optimization program which includes automated exposure control, adjustment of the mA and/or kV according to patient size and/or use of iterative reconstruction technique. COMPARISON:  No priors. FINDINGS: CT HEAD FINDINGS Brain: Moderate cerebral and mild cerebellar atrophy. Patchy and confluent areas of decreased attenuation are noted throughout the deep and periventricular white matter of the cerebral hemispheres bilaterally, compatible with chronic microvascular ischemic disease. No evidence of acute infarction, hemorrhage, hydrocephalus, extra-axial collection or mass lesion/mass effect. Vascular: No hyperdense vessel or unexpected calcification. Skull: Normal. Negative for fracture or focal lesion. Sinuses/Orbits: No acute finding. Other: None. CT CERVICAL SPINE FINDINGS Alignment: Normal. Skull base and vertebrae: No acute fracture. No primary bone lesion or focal pathologic  process. Soft tissues and spinal canal: No prevertebral fluid or swelling. No visible canal hematoma. Disc levels: Multilevel degenerative disc disease most evident at C4-C5, C5-C6 and C6-C7. Mild multilevel facet arthropathy. Upper chest: Extensive emphysema noted in the visualized lung apices. 6 x 4 mm (mean diameter 5 mm) pulmonary nodule in the left upper lobe near the apex (axial image 106 of series 2). Other: None. IMPRESSION: 1. No evidence of significant acute traumatic injury to the skull, brain or cervical spine. 2. Moderate cerebral and mild cerebellar atrophy with extensive chronic microvascular ischemic changes in the cerebral white matter. 3. Multilevel degenerative disc disease and cervical spondylosis, as above. 4. Advanced emphysematous changes noted in the lung apices. There is also a pulmonary nodule in the left upper lobe with a mean diameter of 5 mm.  Follow-up noncontrast chest CT is recommended in 12 months to ensure the stability or regression of this finding.This recommendation follows the consensus statement: Guidelines for Management of Incidental Pulmonary Nodules Detected on CT Images: From the Fleischner Society 2017; Radiology 2017; 284:228-243. Electronically Signed   By: Vinnie Langton M.D.   On: 10/16/2022 08:17   DG Chest Port 1 View  Result Date: 10/16/2022 CLINICAL DATA:  66 year old male with history of trauma from a fall. EXAM: PORTABLE CHEST 1 VIEW COMPARISON:  Chest x-ray 09/27/2022. FINDINGS: Lung volumes are normal. No consolidative airspace disease. No pleural effusions. No pneumothorax. No pulmonary nodule or mass noted. Pulmonary vasculature and the cardiomediastinal silhouette are within normal limits. Atherosclerosis in the thoracic aorta. IMPRESSION: 1.  No radiographic evidence of acute cardiopulmonary disease. 2. Aortic atherosclerosis. Electronically Signed   By: Vinnie Langton M.D.   On: 10/16/2022 07:24   DG Hip Unilat W or Wo Pelvis 2-3 Views  Left  Result Date: 10/16/2022 CLINICAL DATA:  66 year old male with history of left hip pain after recent fall. EXAM: DG HIP (WITH OR WITHOUT PELVIS) 2-3V LEFT COMPARISON:  No priors. FINDINGS: Mildly displaced fracture of the left femoral neck with mild proximal migration of the distal fracture fragment. Left femoral head remains located in the left acetabulum. Bony pelvic ring appears intact. Right proximal femur as visualized appears intact. There is joint space narrowing, subchondral sclerosis, subchondral cyst formation and osteophyte formation in the hip joints bilaterally, indicative of osteoarthritis. IMPRESSION: 1. Acute mildly displaced transcervical left femoral neck fracture, as above. 2. Moderate bilateral hip joint osteoarthritis. Electronically Signed   By: Vinnie Langton M.D.   On: 10/16/2022 07:23    Assessment/Plan: Day of Surgery   Principal Problem:   Hip fracture [facility]) Active Problems:   Alcohol abuse   Hyponatremia   Cocaine abuse (HCC)   Tobacco abuse   Chronic back pain   Protein-calorie malnutrition, severe (HCC)   Closed displaced fracture of left femoral neck ([place])   Patient is stable postop.  Patient will have labs drawn in the morning.  He will complete 24 hours of postop antibiotics.  Patient will begin physical and occupational therapy tomorrow morning.  Reviewed with the patient and his family what to expect during his postoperative course.  He will likely need a skilled nursing facility upon discharge.  Patient is toe-touch weightbearing on the left lower extremity given the screw fixation of his fracture.   Thornton Park , MD 10/16/2022, 7:29 PM

## 2022-10-16 NOTE — Anesthesia Preprocedure Evaluation (Signed)
Anesthesia Evaluation  Patient identified by MRN, date of birth, ID band Patient awake    Reviewed: Allergy & Precautions, H&P , NPO status , Patient's Chart, lab work & pertinent test results, reviewed documented beta blocker date and time   History of Anesthesia Complications Negative for: history of anesthetic complications  Airway Mallampati: II  TM Distance: >3 FB Neck ROM: full    Dental  (+) Dental Advidsory Given, Poor Dentition, Missing   Pulmonary neg shortness of breath, neg COPD, neg recent URI, Current Smoker   Pulmonary exam normal breath sounds clear to auscultation       Cardiovascular Exercise Tolerance: Good hypertension, (-) angina (-) Past MI and (-) Cardiac Stents Normal cardiovascular exam(-) dysrhythmias (-) Valvular Problems/Murmurs Rhythm:regular Rate:Normal     Neuro/Psych Seizures -, Well Controlled,  CVA, Residual Symptoms  negative psych ROS   GI/Hepatic ,GERD  ,,(+)     substance abuse  alcohol use and cocaine use  Endo/Other  negative endocrine ROS    Renal/GU negative Renal ROS  negative genitourinary   Musculoskeletal   Abdominal   Peds  Hematology negative hematology ROS (+)   Anesthesia Other Findings Past Medical History: 02/17/2020: History of cocaine use     Comment:  UA positive for Cocaine 02/17/2020: Positive urine drug screen     Comment:  Cocaine and Amphetamines No date: Seizures (HCC) No date: Stroke [facility])  Patient is positive for cocaine, but surgeon states that the surgery needs to get done sooner thatn later due to the morbidity/mortality of waiting to fix hip fractures.  We will proceed emergently with plan for GA with natural airway.  Reproductive/Obstetrics negative OB ROS                             Anesthesia Physical Anesthesia Plan  ASA: 3 and emergent  Anesthesia Plan: General   Post-op Pain Management:    Induction:  Intravenous  PONV Risk Score and Plan: 1 and Propofol infusion and TIVA  Airway Management Planned: Natural Airway and Simple Face Mask  Additional Equipment:   Intra-op Plan:   Post-operative Plan:   Informed Consent: I have reviewed the patients History and Physical, chart, labs and discussed the procedure including the risks, benefits and alternatives for the proposed anesthesia with the patient or authorized representative who has indicated his/her understanding and acceptance.     Dental Advisory Given  Plan Discussed with: Anesthesiologist, CRNA and Surgeon  Anesthesia Plan Comments:        Anesthesia Quick Evaluation

## 2022-10-16 NOTE — ED Provider Notes (Addendum)
[facility] Provider Note    Event Date/Time   First MD Initiated Contact with Patient 10/16/22 [phone]     (approximate)   History   Hip Pain   HPI  Armari Fussell. is a 66 y.o. male with past medical history of polysubstance use seizure disorder presents because of hip pain.  On Thursday, 2 days ago patient was walking down a ramp outside of his house when he lost his balance slipped and fell onto his left hip.  Was unable to get up on his own.  He has been unable to ambulate on the hip because of significant pain.  When is not moving he does not have pain.  Denies numbness or tingling.  He said that he did have a knot on his head initially but this is gone down.  Denies headache nausea vomiting neck pain.  Denies chest pain shortness of breath.  Denies abdominal pain nausea vomiting.  Peeing and pooping normally.  Tells me that he does drink regularly.  Sometimes 6 beers sometimes 12 years.  Last drink was on Thursday.  Denies history of withdrawal.     Past Medical History:  Diagnosis Date   History of cocaine use 02/17/2020   UA positive for Cocaine   Positive urine drug screen 02/17/2020   Cocaine and Amphetamines   Seizures ([place])    Stroke [facility])     Patient Active Problem List   Diagnosis Date Noted   Alcohol withdrawal ([place]) 11/28/2018     Physical Exam  Triage Vital Signs: ED Triage Vitals  Enc Vitals Group     BP 10/16/22 0647 (!) 152/107     Pulse Rate 10/16/22 0647 (!) 107     Resp 10/16/22 0647 18     Temp 10/16/22 0647 98.6 F (37 C)     Temp Source 10/16/22 0647 Oral     SpO2 10/16/22 0647 97 %     Weight 10/16/22 0646 125 lb (56.7 kg)     Height 10/16/22 0646 5\' 9"  (1.753 m)     Head Circumference --      Peak Flow --      Pain Score 10/16/22 0646 4     Pain Loc --      Pain Edu? --      Excl. in [place]? --     Most recent vital signs: Vitals:   10/16/22 0647  BP: (!) 152/107  Pulse: (!) 107  Resp: 18  Temp: 98.6 F  (37 C)  SpO2: 97%     General: Awake, no distress.  CV:  Good peripheral perfusion.  Resp:  Normal effort.  Abd:  No distention.  Abdomen soft nontender throughout Neuro:             Awake, Alert, Oriented x 3  Other:  Left leg is subtly externally rotated and shortened No palpable pulses on either side, good cap refill 5-5 strength with plantarflexion and dorsiflexion, motion testing of the hip deferred  No signs of trauma to the head or neck, no midline C-spine tenderness Chest wall is nontender no signs of trauma  ED Results / Procedures / Treatments  Labs (all labs ordered are listed, but only abnormal results are displayed) Labs Reviewed  COMPREHENSIVE METABOLIC PANEL  CBC WITH DIFFERENTIAL/PLATELET  PROTIME-INR  APTT  MAGNESIUM  URINE DRUG SCREEN, QUALITATIVE ([facility] ONLY)     EKG  EKG interpretation performed by myself: NSR, nml axis, nml intervals, no acute ischemic  changes    RADIOLOGY X-ray of the left hip reviewed interpreted myself shows femoral neck fracture    PROCEDURES:  Critical Care performed: No  Procedures  The patient is on the cardiac monitor to evaluate for evidence of arrhythmia and/or significant heart rate changes.   MEDICATIONS ORDERED IN ED: Medications  acetaminophen (TYLENOL) tablet 1,000 mg (has no administration in time range)     IMPRESSION / MDM / ASSESSMENT AND PLAN / ED COURSE  I reviewed the triage vital signs and the nursing notes.                              Patient's presentation is most consistent with acute complicated illness / injury requiring diagnostic workup.  Differential diagnosis includes, but is not limited to, hip fracture, pelvic fracture, ligamentous or musculoskeletal injury, intracranial hemorrhage, cervical spine fracture, alcohol withdrawal  The patient is a 66 year old male with history of alcohol use disorder who presents with left hip pain following a fall 2 days ago.  Patient had mechanical  fall onto the left hip 2 days ago and has been unable to ambulate since.  Denies other injuries but that he did have a knot on his head that is gone down.  He is not on blood thinners.  On exam the left leg is mildly shortened and externally rotated he has no palpable pulses in either lower extremity suspect chronic peripheral vascular disease.  He has normal strength with plantarflexion dorsiflexion had not range the hip due to concern for fracture.  X-ray does confirm a transcervical femoral neck fracture.  Given the report of trauma to the head will obtain CT head and C-spine.  Will obtain preop labs.  Patient does drink alcohol chronically tells me 6-12 beers per day.  His last drink was sometime on Thursday.  He is at risk for withdrawal does not appear to be in active withdrawal currently.  Will need CIWA.    I have discussed with orthopedics on-call Dr. Mack Guise.  We will keep patient n.p.o. for potential OR today.      FINAL CLINICAL IMPRESSION(S) / ED DIAGNOSES   Final diagnoses:  Closed fracture of left hip, initial encounter [facility])     Rx / DC Orders   ED Discharge Orders     None        Note:  This document was prepared using Dragon voice recognition software and may include unintentional dictation errors.   Rada Hay, MD 10/16/22 [id number]    Rada Hay, MD 10/16/22 [phone]

## 2022-10-17 DIAGNOSIS — F1010 Alcohol abuse, uncomplicated: Secondary | ICD-10-CM

## 2022-10-17 DIAGNOSIS — S72002A Fracture of unspecified part of neck of left femur, initial encounter for closed fracture: Secondary | ICD-10-CM | POA: Diagnosis not present

## 2022-10-17 DIAGNOSIS — E871 Hypo-osmolality and hyponatremia: Secondary | ICD-10-CM | POA: Diagnosis not present

## 2022-10-17 LAB — CBC
HCT: 32.2 % — ABNORMAL LOW (ref 39.0–52.0)
Hemoglobin: 11.4 g/dL — ABNORMAL LOW (ref 13.0–17.0)
MCH: 35 pg — ABNORMAL HIGH (ref 26.0–34.0)
MCHC: 35.4 g/dL (ref 30.0–36.0)
MCV: 98.8 fL (ref 80.0–100.0)
Platelets: 205 10*3/uL (ref 150–400)
RBC: 3.26 MIL/uL — ABNORMAL LOW (ref 4.22–5.81)
RDW: 11.9 % (ref 11.5–15.5)
WBC: 7 10*3/uL (ref 4.0–10.5)
nRBC: 0 % (ref 0.0–0.2)

## 2022-10-17 LAB — BASIC METABOLIC PANEL
Anion gap: 7 (ref 5–15)
BUN: 14 mg/dL (ref 8–23)
CO2: 23 mmol/L (ref 22–32)
Calcium: 8.7 mg/dL — ABNORMAL LOW (ref 8.9–10.3)
Chloride: 102 mmol/L (ref 98–111)
Creatinine, Ser: 1.13 mg/dL (ref 0.61–1.24)
GFR, Estimated: >60 mL/min (ref >60)
Glucose, Bld: 150 mg/dL — ABNORMAL HIGH (ref 70–99)
Potassium: 4.2 mmol/L (ref 3.5–5.1)
Sodium: 132 mmol/L — ABNORMAL LOW (ref 135–145)

## 2022-10-17 LAB — HIV ANTIBODY (ROUTINE TESTING W REFLEX): HIV Screen 4th Generation wRfx: NONREACTIVE

## 2022-10-17 LAB — MAGNESIUM: Magnesium: 2.1 mg/dL (ref 1.7–2.4)

## 2022-10-17 MED ORDER — ENSURE ENLIVE PO LIQD
237.0000 mL | Freq: Two times a day (BID) | ORAL | Status: DC
  Administered 2022-10-17 – 2022-10-26 (×18): 237 mL via ORAL

## 2022-10-17 NOTE — Progress Notes (Addendum)
Subjective:  POD #1 s/p percutaneous fixation for left mildly displaced femoral neck hip fracture.  Patient is sitting up in bed on the telephone and is in no acute distress.  Patient reports left hip pain as mild.  He has no other complaints.  Objective:   VITALS:   Vitals:   10/16/22 1906 10/16/22 1936 10/16/22 2210 10/17/22 0024  BP: 105/68 114/73 117/80 122/77  Pulse:  62 75 75  Resp: 15 16 17 16   Temp:  (!) 97.4 F (36.3 C) 97.8 F (36.6 C) 98 F (36.7 C)  TempSrc:      SpO2: 99% 100% 100% 100%  Weight:      Height:        PHYSICAL EXAM: Left lower extremity Neurovascular intact Sensation intact distally Intact pulses distally Dorsiflexion/Plantar flexion intact Incision: dressing C/D/I No cellulitis present Compartment soft  LABS  Results for orders placed or performed during the hospital encounter of 10/16/22 (from the past 24 hour(s))  Comprehensive metabolic panel     Status: Abnormal   Collection Time: 10/16/22  9:44 AM  Result Value Ref Range   Sodium 127 (L) 135 - 145 mmol/L   Potassium 4.4 3.5 - 5.1 mmol/L   Chloride 94 (L) 98 - 111 mmol/L   CO2 23 22 - 32 mmol/L   Glucose, Bld 101 (H) 70 - 99 mg/dL   BUN 14 8 - 23 mg/dL   Creatinine, Ser 1.07 0.61 - 1.24 mg/dL   Calcium 8.9 8.9 - 10.3 mg/dL   Total Protein 7.3 6.5 - 8.1 g/dL   Albumin 3.5 3.5 - 5.0 g/dL   AST 23 15 - 41 U/L   ALT 18 0 - 44 U/L   Alkaline Phosphatase 98 38 - 126 U/L   Total Bilirubin 1.2 0.3 - 1.2 mg/dL   GFR, Estimated >60 >60 mL/min   Anion gap 10 5 - 15  CBC with Differential     Status: Abnormal   Collection Time: 10/16/22  9:44 AM  Result Value Ref Range   WBC 7.2 4.0 - 10.5 K/uL   RBC 3.91 (L) 4.22 - 5.81 MIL/uL   Hemoglobin 13.3 13.0 - 17.0 g/dL   HCT 37.9 (L) 39.0 - 52.0 %   MCV 96.9 80.0 - 100.0 fL   MCH 34.0 26.0 - 34.0 pg   MCHC 35.1 30.0 - 36.0 g/dL   RDW 11.9 11.5 - 15.5 %   Platelets 202 150 - 400 K/uL   nRBC 0.0 0.0 - 0.2 %   Neutrophils Relative % 79 %    Neutro Abs 5.6 1.7 - 7.7 K/uL   Lymphocytes Relative 9 %   Lymphs Abs 0.7 0.7 - 4.0 K/uL   Monocytes Relative 12 %   Monocytes Absolute 0.9 0.1 - 1.0 K/uL   Eosinophils Relative 0 %   Eosinophils Absolute 0.0 0.0 - 0.5 K/uL   Basophils Relative 0 %   Basophils Absolute 0.0 0.0 - 0.1 K/uL   Immature Granulocytes 0 %   Abs Immature Granulocytes 0.02 0.00 - 0.07 K/uL  Protime-INR     Status: None   Collection Time: 10/16/22  9:44 AM  Result Value Ref Range   Prothrombin Time 12.5 11.4 - 15.2 seconds   INR 1.0 0.8 - 1.2  APTT     Status: Abnormal   Collection Time: 10/16/22  9:44 AM  Result Value Ref Range   aPTT 43 (H) 24 - 36 seconds  Magnesium     Status: None  Collection Time: 10/16/22  9:44 AM  Result Value Ref Range   Magnesium 2.1 1.7 - 2.4 mg/dL  Urine Drug Screen, Qualitative ([facility] only)     Status: Abnormal   Collection Time: 10/16/22  9:44 AM  Result Value Ref Range   Tricyclic, Ur Screen NONE DETECTED NONE DETECTED   Amphetamines, Ur Screen NONE DETECTED NONE DETECTED   MDMA (Ecstasy)Ur Screen NONE DETECTED NONE DETECTED   Cocaine Metabolite,Ur [place] POSITIVE (A) NONE DETECTED   Opiate, Ur Screen NONE DETECTED NONE DETECTED   Phencyclidine (PCP) Ur S NONE DETECTED NONE DETECTED   Cannabinoid 50 Ng, Ur [place] NONE DETECTED NONE DETECTED   Barbiturates, Ur Screen NONE DETECTED NONE DETECTED   Benzodiazepine, Ur Scrn NONE DETECTED NONE DETECTED   Methadone Scn, Ur NONE DETECTED NONE DETECTED  CBC     Status: Abnormal   Collection Time: 10/17/22  6:12 AM  Result Value Ref Range   WBC 7.0 4.0 - 10.5 K/uL   RBC 3.26 (L) 4.22 - 5.81 MIL/uL   Hemoglobin 11.4 (L) 13.0 - 17.0 g/dL   HCT 32.2 (L) 39.0 - 52.0 %   MCV 98.8 80.0 - 100.0 fL   MCH 35.0 (H) 26.0 - 34.0 pg   MCHC 35.4 30.0 - 36.0 g/dL   RDW 11.9 11.5 - 15.5 %   Platelets 205 150 - 400 K/uL   nRBC 0.0 0.0 - 0.2 %  Basic metabolic panel     Status: Abnormal   Collection Time: 10/17/22  6:12 AM  Result Value Ref  Range   Sodium 132 (L) 135 - 145 mmol/L   Potassium 4.2 3.5 - 5.1 mmol/L   Chloride 102 98 - 111 mmol/L   CO2 23 22 - 32 mmol/L   Glucose, Bld 150 (H) 70 - 99 mg/dL   BUN 14 8 - 23 mg/dL   Creatinine, Ser 1.13 0.61 - 1.24 mg/dL   Calcium 8.7 (L) 8.9 - 10.3 mg/dL   GFR, Estimated >60 >60 mL/min   Anion gap 7 5 - 15  Magnesium     Status: None   Collection Time: 10/17/22  6:12 AM  Result Value Ref Range   Magnesium 2.1 1.7 - 2.4 mg/dL    DG Hip Port Unilat With Pelvis 1V Left  Result Date: 10/16/2022 CLINICAL DATA:  Status post pinning of a left hip fracture. EXAM: DG HIP (WITH OR WITHOUT PELVIS) 1V PORT LEFT COMPARISON:  CT obtained earlier today. FINDINGS: Interval placement of 4 pins bridging the previously demonstrated left femoral neck fracture with mild residual valgus angulation. IMPRESSION: Status post pinning of a left femoral neck fracture. Electronically Signed   By: Claudie Revering M.D.   On: 10/16/2022 20:32   DG HIP UNILAT WITH PELVIS 2-3 VIEWS LEFT  Result Date: 10/16/2022 CLINICAL DATA:  Closed fracture of left hip. EXAM: DG HIP (WITH OR WITHOUT PELVIS) 2-3V LEFT COMPARISON:  Preoperative imaging. FINDINGS: Six fluoroscopic spot views of the left hip obtained in the operating room. Four cannulated screws traverse the left femoral neck. Fluoroscopy time 1 minutes 3 seconds. Dose 5.4 mGy. IMPRESSION: Intraoperative fluoroscopy during ORIF of left femoral neck fracture. Electronically Signed   By: Keith Rake M.D.   On: 10/16/2022 18:06   DG C-Arm 1-60 Min-No Report  Result Date: 10/16/2022 Fluoroscopy was utilized by the requesting physician.  No radiographic interpretation.   CT HIP LEFT WO CONTRAST  Result Date: 10/16/2022 CLINICAL DATA:  Left hip fracture EXAM: CT OF THE LEFT HIP  WITHOUT CONTRAST TECHNIQUE: Multidetector CT imaging of the left hip was performed according to the standard protocol. Multiplanar CT image reconstructions were also generated. RADIATION DOSE  REDUCTION: This exam was performed according to the departmental dose-optimization program which includes automated exposure control, adjustment of the mA and/or kV according to patient size and/or use of iterative reconstruction technique. COMPARISON:  Same-day x-ray FINDINGS: Bones/Joint/Cartilage Acute transcervical fracture of the left femoral neck with approximately 1 cm of proximal migration and impaction. Mild anterior apex angulation. No evidence of fracture involvement in the intertrochanteric region. Hip joint alignment is maintained without dislocation. Bony pelvis intact without fracture or diastasis. No lytic or sclerotic bone lesion. Mild joint space narrowing of both hips. Ligaments Suboptimally assessed by CT. Muscles and Tendons Asymmetric fullness of the left iliopsoas musculature which may be due to muscle strain and small intramuscular hematoma. Soft tissues Soft tissue swelling/edema at the fracture site. No organized fluid collection. No left inguinal lymphadenopathy. Atherosclerotic vascular calcifications. IMPRESSION: 1. Acute mildly displaced and angulated left femoral neck fracture. 2. Asymmetric fullness of the left iliopsoas musculature which may be due to muscle strain and small intramuscular hematoma. Electronically Signed   By: Davina Poke D.O.   On: 10/16/2022 09:25   CT HEAD WO CONTRAST (5MM)  Result Date: 10/16/2022 CLINICAL DATA:  66 year old male history of trauma from a fall. EXAM: CT HEAD WITHOUT CONTRAST CT CERVICAL SPINE WITHOUT CONTRAST TECHNIQUE: Multidetector CT imaging of the head and cervical spine was performed following the standard protocol without intravenous contrast. Multiplanar CT image reconstructions of the cervical spine were also generated. RADIATION DOSE REDUCTION: This exam was performed according to the departmental dose-optimization program which includes automated exposure control, adjustment of the mA and/or kV according to patient size and/or  use of iterative reconstruction technique. COMPARISON:  No priors. FINDINGS: CT HEAD FINDINGS Brain: Moderate cerebral and mild cerebellar atrophy. Patchy and confluent areas of decreased attenuation are noted throughout the deep and periventricular white matter of the cerebral hemispheres bilaterally, compatible with chronic microvascular ischemic disease. No evidence of acute infarction, hemorrhage, hydrocephalus, extra-axial collection or mass lesion/mass effect. Vascular: No hyperdense vessel or unexpected calcification. Skull: Normal. Negative for fracture or focal lesion. Sinuses/Orbits: No acute finding. Other: None. CT CERVICAL SPINE FINDINGS Alignment: Normal. Skull base and vertebrae: No acute fracture. No primary bone lesion or focal pathologic process. Soft tissues and spinal canal: No prevertebral fluid or swelling. No visible canal hematoma. Disc levels: Multilevel degenerative disc disease most evident at C4-C5, C5-C6 and C6-C7. Mild multilevel facet arthropathy. Upper chest: Extensive emphysema noted in the visualized lung apices. 6 x 4 mm (mean diameter 5 mm) pulmonary nodule in the left upper lobe near the apex (axial image 106 of series 2). Other: None. IMPRESSION: 1. No evidence of significant acute traumatic injury to the skull, brain or cervical spine. 2. Moderate cerebral and mild cerebellar atrophy with extensive chronic microvascular ischemic changes in the cerebral white matter. 3. Multilevel degenerative disc disease and cervical spondylosis, as above. 4. Advanced emphysematous changes noted in the lung apices. There is also a pulmonary nodule in the left upper lobe with a mean diameter of 5 mm. Follow-up noncontrast chest CT is recommended in 12 months to ensure the stability or regression of this finding.This recommendation follows the consensus statement: Guidelines for Management of Incidental Pulmonary Nodules Detected on CT Images: From the Fleischner Society 2017; Radiology 2017;  284:228-243. Electronically Signed   By: Vinnie Langton M.D.   On: 10/16/2022  08:17   CT Cervical Spine Wo Contrast  Result Date: 10/16/2022 CLINICAL DATA:  66 year old male history of trauma from a fall. EXAM: CT HEAD WITHOUT CONTRAST CT CERVICAL SPINE WITHOUT CONTRAST TECHNIQUE: Multidetector CT imaging of the head and cervical spine was performed following the standard protocol without intravenous contrast. Multiplanar CT image reconstructions of the cervical spine were also generated. RADIATION DOSE REDUCTION: This exam was performed according to the departmental dose-optimization program which includes automated exposure control, adjustment of the mA and/or kV according to patient size and/or use of iterative reconstruction technique. COMPARISON:  No priors. FINDINGS: CT HEAD FINDINGS Brain: Moderate cerebral and mild cerebellar atrophy. Patchy and confluent areas of decreased attenuation are noted throughout the deep and periventricular white matter of the cerebral hemispheres bilaterally, compatible with chronic microvascular ischemic disease. No evidence of acute infarction, hemorrhage, hydrocephalus, extra-axial collection or mass lesion/mass effect. Vascular: No hyperdense vessel or unexpected calcification. Skull: Normal. Negative for fracture or focal lesion. Sinuses/Orbits: No acute finding. Other: None. CT CERVICAL SPINE FINDINGS Alignment: Normal. Skull base and vertebrae: No acute fracture. No primary bone lesion or focal pathologic process. Soft tissues and spinal canal: No prevertebral fluid or swelling. No visible canal hematoma. Disc levels: Multilevel degenerative disc disease most evident at C4-C5, C5-C6 and C6-C7. Mild multilevel facet arthropathy. Upper chest: Extensive emphysema noted in the visualized lung apices. 6 x 4 mm (mean diameter 5 mm) pulmonary nodule in the left upper lobe near the apex (axial image 106 of series 2). Other: None. IMPRESSION: 1. No evidence of significant  acute traumatic injury to the skull, brain or cervical spine. 2. Moderate cerebral and mild cerebellar atrophy with extensive chronic microvascular ischemic changes in the cerebral white matter. 3. Multilevel degenerative disc disease and cervical spondylosis, as above. 4. Advanced emphysematous changes noted in the lung apices. There is also a pulmonary nodule in the left upper lobe with a mean diameter of 5 mm. Follow-up noncontrast chest CT is recommended in 12 months to ensure the stability or regression of this finding.This recommendation follows the consensus statement: Guidelines for Management of Incidental Pulmonary Nodules Detected on CT Images: From the Fleischner Society 2017; Radiology 2017; 284:228-243. Electronically Signed   By: Vinnie Langton M.D.   On: 10/16/2022 08:17   DG Chest Port 1 View  Result Date: 10/16/2022 CLINICAL DATA:  66 year old male with history of trauma from a fall. EXAM: PORTABLE CHEST 1 VIEW COMPARISON:  Chest x-ray 09/27/2022. FINDINGS: Lung volumes are normal. No consolidative airspace disease. No pleural effusions. No pneumothorax. No pulmonary nodule or mass noted. Pulmonary vasculature and the cardiomediastinal silhouette are within normal limits. Atherosclerosis in the thoracic aorta. IMPRESSION: 1.  No radiographic evidence of acute cardiopulmonary disease. 2. Aortic atherosclerosis. Electronically Signed   By: Vinnie Langton M.D.   On: 10/16/2022 07:24   DG Hip Unilat W or Wo Pelvis 2-3 Views Left  Result Date: 10/16/2022 CLINICAL DATA:  66 year old male with history of left hip pain after recent fall. EXAM: DG HIP (WITH OR WITHOUT PELVIS) 2-3V LEFT COMPARISON:  No priors. FINDINGS: Mildly displaced fracture of the left femoral neck with mild proximal migration of the distal fracture fragment. Left femoral head remains located in the left acetabulum. Bony pelvic ring appears intact. Right proximal femur as visualized appears intact. There is joint space  narrowing, subchondral sclerosis, subchondral cyst formation and osteophyte formation in the hip joints bilaterally, indicative of osteoarthritis. IMPRESSION: 1. Acute mildly displaced transcervical left femoral neck fracture, as above.  2. Moderate bilateral hip joint osteoarthritis. Electronically Signed   By: Vinnie Langton M.D.   On: 10/16/2022 07:23    Assessment/Plan: 1 Day Post-Op   Principal Problem:   Hip fracture (HCC) Active Problems:   Alcohol abuse   Hyponatremia   Cocaine abuse (HCC)   Tobacco abuse   Chronic back pain   Protein-calorie malnutrition, severe ([place])   Closed displaced fracture of left femoral neck ([place])  Patient doing well postop from an orthopedic standpoint.  Patient's last vitals showed mild bradycardia but those were otherwise stable.  His postop hemoglobin is 11.4.  We will begin Lovenox for DVT prophylaxis today.  He will complete 24 hours of postop antibiotics.  He will begin physical and occupational therapy today.  Patient is toe-touch weightbearing on the left lower extremity.  He will need a skilled nursing facility upon discharge.  Continue with CIWA protocol.    [place] , MD 10/17/2022, 8:40 AM

## 2022-10-17 NOTE — Progress Notes (Signed)
  Progress Note   Patient: Zachary Pugh. UYQ:[id number] DOB: 03-17-1956 DOA: 10/16/2022     1 DOS: the patient was seen and examined on 10/17/2022   Brief hospital course: Zachary Pugh. is a 66 y.o. male with medical history significant of seizure disorder, tobacco and alcohol abuse, cocaine abuse who present to the hospital with hip fracture.  Open reduction and internal fixation was performed on 11/4.   Assessment and Plan:  Acute left femoral head fracture. Patient had a surgery performed yesterday, currently doing well.  Pain under control.  Hemoglobin stable.   Hyponatremia. Alcohol abuse. She has no evidence of alcohol withdrawal today.  Sodium level is better.  Continue normal saline at a lower rate.     Severe protein calorie malnutrition. Patient appears severely malnourished with BMI of 18.46 with muscle atrophy. Started protein supplements.   Cocaine abuse. Tobacco abuse. Continue to follow, patient does not want a nicotine patch.      Subjective:  Patient doing well, no significant pain. No shortness of breath.  Physical Exam: Vitals:   10/16/22 1936 10/16/22 2210 10/17/22 0024 10/17/22 0840  BP: 114/73 117/80 122/77 128/75  Pulse: 62 75 75 (!) 57  Resp: 16 17 16 17   Temp: (!) 97.4 F (36.3 C) 97.8 F (36.6 C) 98 F (36.7 C) 97.9 F (36.6 C)  TempSrc:      SpO2: 100% 100% 100% 100%  Weight:      Height:       General exam: Appears calm and comfortable, cachectic and malnourished. Respiratory system: Clear to auscultation. Respiratory effort normal. Cardiovascular system: S1 & S2 heard, RRR. No JVD, murmurs, rubs, gallops or clicks. No pedal edema. Gastrointestinal system: Abdomen is nondistended, soft and nontender. No organomegaly or masses felt. Normal bowel sounds heard. Central nervous system: Alert and oriented. No focal neurological deficits. Extremities: Significant muscle atrophy. Skin: No rashes, lesions or ulcers Psychiatry:  Judgement and insight appear normal. Mood & affect appropriate. ' Data Reviewed:  Results and surgical note reviewed.  Family Communication: None  Disposition: Status is: Inpatient Remains inpatient appropriate because: Severity of disease, postop day #1.  Planned Discharge Destination: Home with Home Health    Time spent: 35 minutes  Author: Sharen Hones, MD 10/17/2022 12:08 PM  For on call review [email or web address].

## 2022-10-17 NOTE — Evaluation (Signed)
Physical Therapy Evaluation Patient Details Name: Zachary Pugh. MRN: [id number] DOB: [birth year] Today's Date: 10/17/2022  History of Present Illness  Pt admitted for L hip fx s/p ORIF on 10/16/22 secondary to fall while going down stairs. History includes seizures, alcohol abuse, and cocaine abuse.  Clinical Impression  Pt is a pleasant 66 year old male who was admitted for L hip ORIF s/p fall. Pt performs bed mobility with cga, transfers with min assist and only able to transfer to recliner secondary to limited [place] status. Pt demonstrates deficits with strength/mobility/pain. Pt needs cues for safety and maintaining correct WBing status. Would benefit from skilled PT to address above deficits and promote optimal return to PLOF. Recommend transition to [place] upon discharge from acute hospitalization. Pt is poor historian, however appears to have family support and DME at home. Attempted to call brother to clarify. If family unable to provide assist, pt would likely need higher level of care. Will continue to progress.      Recommendations for follow up therapy are one component of a multi-disciplinary discharge planning process, led by the attending physician.  Recommendations may be updated based on patient status, additional functional criteria and insurance authorization.  Follow Up Recommendations Home health PT      Assistance Recommended at Discharge Frequent or constant Supervision/Assistance  Patient can return home with the following  A lot of help with walking and/or transfers;Assist for transportation;Help with stairs or ramp for entrance    Equipment Recommendations None recommended by PT  Recommendations for Other Services       Functional Status Assessment Patient has had a recent decline in their functional status and demonstrates the ability to make significant improvements in function in a reasonable and predictable amount of time.     Precautions / Restrictions  Precautions Precautions: Fall Restrictions Weight Bearing Restrictions: Yes LLE Weight Bearing: Touchdown weight bearing      Mobility  Bed Mobility Overal bed mobility: Needs Assistance Bed Mobility: Supine to Sit     Supine to sit: Min guard     General bed mobility comments: safe technique with upright posture once seated at EOB    Transfers Overall transfer level: Needs assistance Equipment used: Rolling walker (2 wheels) Transfers: Bed to chair/wheelchair/BSC     Step pivot transfers: Min assist       General transfer comment: needs cues to maintain correct WBing status. Fatigues quickly with limited exertion    Ambulation/Gait               General Gait Details: NT, due to [employer]  Stairs            Wheelchair Mobility    Modified Rankin (Stroke Patients Only)       Balance Overall balance assessment: History of Falls, Needs assistance Sitting-balance support: Feet supported Sitting balance-Leahy Scale: Poor     Standing balance support: Bilateral upper extremity supported Standing balance-Leahy Scale: Fair                               Pertinent Vitals/Pain Pain Assessment Pain Assessment: Faces Faces Pain Scale: Hurts a little bit Pain Location: L LE Pain Descriptors / Indicators: Operative site guarding Pain Intervention(s): Limited activity within patient's tolerance, RN gave pain meds during session, Ice applied, Repositioned    Home Living Family/patient expects to be discharged to:: Private residence Living Arrangements: Other relatives Available Help at Discharge: Family (brother and  nephew 24/7 mostly) Type of Home: House Home Access: Ramped entrance       Home Layout: One level Home Equipment: Conservation officer, nature (2 wheels);Cane - single point;Wheelchair - manual      Prior Function Prior Level of Function : Independent/Modified Independent             Mobility Comments: reports no other falls. Pt is  very poor historian, unsure of accuracy. Attempted to call brother, however no answer ADLs Comments: reports indep     Hand Dominance        Extremity/Trunk Assessment   Upper Extremity Assessment Upper Extremity Assessment: Overall WFL for tasks assessed    Lower Extremity Assessment Lower Extremity Assessment: Generalized weakness (L LE grossly 3/5; R LE grossly 5/5)       Communication   Communication: No difficulties  Cognition Arousal/Alertness: Awake/alert Behavior During Therapy: WFL for tasks assessed/performed Overall Cognitive Status: Difficult to assess                                 General Comments: alert and oriented x 3, however still appears poor historian with home set up        General Comments      Exercises     Assessment/Plan    PT Assessment Patient needs continued PT services  PT Problem List Decreased strength;Decreased balance;Decreased mobility;Pain       PT Treatment Interventions DME instruction;Gait training;Stair training;Therapeutic exercise    PT Goals (Current goals can be found in the Care Plan section)  Acute Rehab PT Goals Patient Stated Goal: to go home PT Goal Formulation: With patient Time For Goal Achievement: 10/31/22 Potential to Achieve Goals: Good    Frequency BID     Co-evaluation               AM-PAC PT "6 Clicks" Mobility  Outcome Measure Help needed turning from your back to your side while in a flat bed without using bedrails?: A Little Help needed moving from lying on your back to sitting on the side of a flat bed without using bedrails?: A Little Help needed moving to and from a bed to a chair (including a wheelchair)?: A Little Help needed standing up from a chair using your arms (e.g., wheelchair or bedside chair)?: A Little Help needed to walk in hospital room?: A Lot Help needed climbing 3-5 steps with a railing? : A Lot 6 Click Score: 16    End of Session Equipment  Utilized During Treatment: Gait belt Activity Tolerance: Patient tolerated treatment well Patient left: in chair;with chair alarm set Nurse Communication: Mobility status PT Visit Diagnosis: Muscle weakness (generalized) (M62.81);Difficulty in walking, not elsewhere classified (R26.2);History of falling (Z91.81);Pain Pain - Right/Left: Left Pain - part of body: Hip    Time: 2563-8937 PT Time Calculation (min) (ACUTE ONLY): 19 min   Charges:   PT Evaluation $PT Eval Low Complexity: 1 Low          Greggory Stallion, PT, DPT, GCS [phone]   Evann Koelzer 10/17/2022, 12:36 PM

## 2022-10-17 NOTE — Progress Notes (Signed)
Initial Nutrition Assessment RD working remotely.   DOCUMENTATION CODES:   Underweight  INTERVENTION:  - ordered Ensure Plus High Protein BID, each supplement provides 350 kcal and 20 grams of protein.  - complete NFPE when feasible.   NUTRITION DIAGNOSIS:   Increased nutrient needs related to hip fracture, post-op healing as evidenced by estimated needs.  GOAL:   Patient will meet greater than or equal to 90% of their needs  MONITOR:   PO intake, Supplement acceptance, Labs, Weight trends  REASON FOR ASSESSMENT:   Consult Hip fracture protocol  ASSESSMENT:   66 y.o. male with medical history of seizure disorder, stroke, tobacco and alcohol abuse (6-7 beers/day), and cocaine use. He presented to the ED after falling down the steps while drinking alcohol. In the ED he was found to have a L hip fracture. Orthopedics was consulted and patient is pending surgery.  Patient is POD #1 percutaneous fixation of L femoral neck hip fracture.  Diet advanced from NPO to Regular yesterday at 2148. No meal intake percentages documented in the flow sheet.  He has not been seen by a [place] RD at any time in the past.  Weight yesterday was documented as 125 lb; though it appears to possibly be a stated weight. Weight on 09/27/22 was 126 lb, weight on 02/17/20 was 130 lb, and weight on 11/28/18 was 125 lb.    Labs reviewed; Na: 132 mmol/l, Ca: 8.7 mg/dl. Medications reviewed; 100 mg colace BID, 1 mg folvite/day, 1 table multivitamin with minerals/day, 1 tablet senokot BID, 100 mg thiamine/day. IVF; NS @ 50 ml/hr.    NUTRITION - FOCUSED PHYSICAL EXAM:  RD working remotely.  Diet Order:   Diet Order             Diet regular Room service appropriate? Yes; Fluid consistency: Thin  Diet effective now                   EDUCATION NEEDS:   No education needs have been identified at this time  Skin:  Skin Assessment: Skin Integrity Issues: Skin Integrity Issues::  Incisions Incisions: L hip (11/4)  Last BM:  PTA/unknown  Height:   Ht Readings from Last 1 Encounters:  10/16/22 5\' 9"  (1.753 m)    Weight:   Wt Readings from Last 1 Encounters:  10/16/22 56.7 kg     BMI:  Body mass index is 18.46 kg/m.  Estimated Nutritional Needs:  Kcal:  1700-1950 kcal Protein:  85-100 grams Fluid:  >/= 1.8 L/day     Zachary Matin, MS, RD, LDN, CNSC Clinical Dietitian PRN/Relief staff On-call/weekend pager # available in [facility]

## 2022-10-17 NOTE — Plan of Care (Signed)
  Problem: Education: Goal: Knowledge of General Education information will improve Description: Including pain rating scale, medication(s)/side effects and non-pharmacologic comfort measures Outcome: Progressing   Problem: Health Behavior/Discharge Planning: Goal: Ability to manage health-related needs will improve Outcome: Progressing   Problem: Clinical Measurements: Goal: Ability to maintain clinical measurements within normal limits will improve Outcome: Progressing Goal: Will remain free from infection Outcome: Progressing Goal: Diagnostic test results will improve Outcome: Progressing   Problem: Activity: Goal: Risk for activity intolerance will decrease Outcome: Progressing   Problem: Nutrition: Goal: Adequate nutrition will be maintained Outcome: Progressing   Problem: Coping: Goal: Level of anxiety will decrease Outcome: Progressing   Problem: Pain Managment: Goal: General experience of comfort will improve Outcome: Progressing   Problem: Safety: Goal: Ability to remain free from injury will improve Outcome: Progressing   Problem: Skin Integrity: Goal: Risk for impaired skin integrity will decrease Outcome: Progressing   Problem: Pain Management: Goal: Pain level will decrease Outcome: Progressing

## 2022-10-17 NOTE — Hospital Course (Addendum)
Zachary Pugh. is a 66 y.o. male with medical history significant of seizure disorder, tobacco and alcohol abuse, cocaine abuse who present to the hospital with hip fracture.  Open reduction and internal fixation was performed on 11/4. He is working with physical therapy, sodium level normalized.  Currently pending nursing home placement.

## 2022-10-18 ENCOUNTER — Encounter: Payer: Self-pay | Admitting: Orthopedic Surgery

## 2022-10-18 DIAGNOSIS — S72002A Fracture of unspecified part of neck of left femur, initial encounter for closed fracture: Secondary | ICD-10-CM | POA: Diagnosis not present

## 2022-10-18 DIAGNOSIS — F1010 Alcohol abuse, uncomplicated: Secondary | ICD-10-CM | POA: Diagnosis not present

## 2022-10-18 DIAGNOSIS — E871 Hypo-osmolality and hyponatremia: Secondary | ICD-10-CM | POA: Diagnosis not present

## 2022-10-18 LAB — CBC
HCT: 30.8 % — ABNORMAL LOW (ref 39.0–52.0)
Hemoglobin: 10.6 g/dL — ABNORMAL LOW (ref 13.0–17.0)
MCH: 34.6 pg — ABNORMAL HIGH (ref 26.0–34.0)
MCHC: 34.4 g/dL (ref 30.0–36.0)
MCV: 100.7 fL — ABNORMAL HIGH (ref 80.0–100.0)
Platelets: 200 10*3/uL (ref 150–400)
RBC: 3.06 MIL/uL — ABNORMAL LOW (ref 4.22–5.81)
RDW: 12.2 % (ref 11.5–15.5)
WBC: 6.6 10*3/uL (ref 4.0–10.5)
nRBC: 0 % (ref 0.0–0.2)

## 2022-10-18 LAB — MAGNESIUM: Magnesium: 2.1 mg/dL (ref 1.7–2.4)

## 2022-10-18 LAB — BASIC METABOLIC PANEL
Anion gap: 6 (ref 5–15)
BUN: 16 mg/dL (ref 8–23)
CO2: 24 mmol/L (ref 22–32)
Calcium: 8.4 mg/dL — ABNORMAL LOW (ref 8.9–10.3)
Chloride: 105 mmol/L (ref 98–111)
Creatinine, Ser: 1.02 mg/dL (ref 0.61–1.24)
GFR, Estimated: >60 mL/min (ref >60)
Glucose, Bld: 104 mg/dL — ABNORMAL HIGH (ref 70–99)
Potassium: 3.8 mmol/L (ref 3.5–5.1)
Sodium: 135 mmol/L (ref 135–145)

## 2022-10-18 NOTE — Progress Notes (Signed)
Physical Therapy Treatment Patient Details Name: Zachary Pugh. MRN: [id number] DOB: 1956/05/18 Today's Date: 10/18/2022   History of Present Illness Pt admitted for L hip fx s/p ORIF on 10/16/22 secondary to fall while going down stairs. History includes seizures, alcohol abuse, and cocaine abuse.    PT Comments    Pt is making good progress towards goals, however still needs frequent supervision/assist with mobility efforts. Due to [facility] restrictions, very effortful for true ambulation. Pt has access to [facility] at home and able to practice [facility] transfers this session. Education given on propulsion, however still requires assist at this time. Will plan additional treatment in PM for further training. If pt not able to have 24/7 assist, would need to look into higher level of care.    Recommendations for follow up therapy are one component of a multi-disciplinary discharge planning process, led by the attending physician.  Recommendations may be updated based on patient status, additional functional criteria and insurance authorization.  Follow Up Recommendations  Home health PT     Assistance Recommended at Discharge Frequent or constant Supervision/Assistance  Patient can return home with the following A lot of help with walking and/or transfers;Assist for transportation;Help with stairs or ramp for entrance   Equipment Recommendations  None recommended by PT    Recommendations for Other Services       Precautions / Restrictions Precautions Precautions: Fall Restrictions Weight Bearing Restrictions: Yes LLE Weight Bearing: Touchdown weight bearing     Mobility  Bed Mobility               General bed mobility comments: recieved and left in chair    Transfers Overall transfer level: Needs assistance Equipment used: Rolling walker (2 wheels) Transfers: Sit to/from Stand, Bed to chair/wheelchair/BSC Sit to Stand: Min assist   Step pivot transfers: Mod assist        General transfer comment: needed increased assist this session secondary to pain. Delayed processing and needing cues for WBing status. Able to perform several transfers to/from recliner to various surfaces    Ambulation/Gait Ambulation/Gait assistance: Min assist Gait Distance (Feet): 4 Feet Assistive device: Rolling walker (2 wheels) Gait Pattern/deviations: Step-to pattern       General Gait Details: needs heavy cues for sequencing and fatigues quickly with ambulation trial. Further mobility training performed in [facility]   Stairs             Wheelchair Mobility Wheelchair Mobility Wheelchair mobility: Yes Wheelchair propulsion: Both upper extremities, Right lower extremity Wheelchair parts: Needs assistance Distance: 15 Wheelchair Assistance Details (indicate cue type and reason): Educated on WC parts and pt able to self propel with L hand however has difficulty sequencing to propel with R hand. Complains of pain with attempts for walking feet on floor. Needs min/mod assist for WC  Modified Rankin (Stroke Patients Only)       Balance Overall balance assessment: History of Falls, Needs assistance Sitting-balance support: Feet supported Sitting balance-Leahy Scale: Good     Standing balance support: Bilateral upper extremity supported Standing balance-Leahy Scale: Poor                              Cognition Arousal/Alertness: Awake/alert Behavior During Therapy: WFL for tasks assessed/performed Overall Cognitive Status: Within Functional Limits for tasks assessed  General Comments: more oriented today, however appears to have baseline cognitive deficits noted with command following        Exercises Other Exercises Other Exercises: written HEP given and reviewed. Pt able to perform L LE QS, AP, hip add squeezes, and hip abd/add. 10 reps with cga.    General Comments        Pertinent Vitals/Pain Pain  Assessment Pain Assessment: Faces Faces Pain Scale: Hurts whole lot Pain Location: L LE Pain Descriptors / Indicators: Operative site guarding Pain Intervention(s): Limited activity within patient's tolerance, Patient requesting pain meds-RN notified, Repositioned    Home Living Family/patient expects to be discharged to:: Private residence Living Arrangements: Other relatives Available Help at Discharge: Family;Available 24 hours/day (cousin, brother, uncle) Type of Home: House Home Access: Ramped entrance       Home Layout: One level Home Equipment: Agricultural consultant (2 wheels);Cane - single point;Wheelchair - manual      Prior Function            PT Goals (current goals can now be found in the care plan section) Acute Rehab PT Goals Patient Stated Goal: to go home PT Goal Formulation: With patient Time For Goal Achievement: 10/31/22 Potential to Achieve Goals: Good Progress towards PT goals: Progressing toward goals    Frequency    BID      PT Plan Current plan remains appropriate    Co-evaluation              AM-PAC PT "6 Clicks" Mobility   Outcome Measure  Help needed turning from your back to your side while in a flat bed without using bedrails?: A Little Help needed moving from lying on your back to sitting on the side of a flat bed without using bedrails?: A Little Help needed moving to and from a bed to a chair (including a wheelchair)?: A Little Help needed standing up from a chair using your arms (e.g., wheelchair or bedside chair)?: A Little Help needed to walk in hospital room?: A Lot Help needed climbing 3-5 steps with a railing? : Total 6 Click Score: 15    End of Session Equipment Utilized During Treatment: Gait belt Activity Tolerance: Patient tolerated treatment well Patient left: in chair;with chair alarm set Nurse Communication: Mobility status PT Visit Diagnosis: Muscle weakness (generalized) (M62.81);Difficulty in walking, not  elsewhere classified (R26.2);History of falling (Z91.81);Pain Pain - Right/Left: Left Pain - part of body: Hip     Time: 8295-6213 PT Time Calculation (min) (ACUTE ONLY): 29 min  Charges:  $Gait Training: 8-22 mins $Therapeutic Exercise: 8-22 mins                     Elizabeth Palau, PT, DPT, GCS [phone]    Zachary Pugh 10/18/2022, 12:15 PM

## 2022-10-18 NOTE — Progress Notes (Signed)
Subjective:  POD #2 s/p percutaneous fixation of left mildly displaced femoral neck hip fracture.   Patient sleeping with bed sheets over his head when I arrived in the room.  Patient does not remove these during my examination.  Patient reports left hip pain as mild to moderate.    Objective:   VITALS:   Vitals:   10/17/22 2100 10/18/22 0021 10/18/22 0300 10/18/22 0700  BP: (!) 140/62 137/86 (!) 142/92 (!) 143/87  Pulse: 76 83 85 72  Resp:  17  18  Temp:  99 F (37.2 C)  98.1 F (36.7 C)  TempSrc:    Oral  SpO2:  95%  100%  Weight:      Height:        PHYSICAL EXAM: Left lower extremity Neurovascular intact Sensation intact distally Intact pulses distally Dorsiflexion/Plantar flexion intact Incision: dressing C/D/I No cellulitis present Compartment soft  LABS  Results for orders placed or performed during the hospital encounter of 10/16/22 (from the past 24 hour(s))  CBC     Status: Abnormal   Collection Time: 10/18/22  5:02 AM  Result Value Ref Range   WBC 6.6 4.0 - 10.5 K/uL   RBC 3.06 (L) 4.22 - 5.81 MIL/uL   Hemoglobin 10.6 (L) 13.0 - 17.0 g/dL   HCT 30.8 (L) 39.0 - 52.0 %   MCV 100.7 (H) 80.0 - 100.0 fL   MCH 34.6 (H) 26.0 - 34.0 pg   MCHC 34.4 30.0 - 36.0 g/dL   RDW 12.2 11.5 - 15.5 %   Platelets 200 150 - 400 K/uL   nRBC 0.0 0.0 - 0.2 %  Basic metabolic panel     Status: Abnormal   Collection Time: 10/18/22  5:02 AM  Result Value Ref Range   Sodium 135 135 - 145 mmol/L   Potassium 3.8 3.5 - 5.1 mmol/L   Chloride 105 98 - 111 mmol/L   CO2 24 22 - 32 mmol/L   Glucose, Bld 104 (H) 70 - 99 mg/dL   BUN 16 8 - 23 mg/dL   Creatinine, Ser 1.02 0.61 - 1.24 mg/dL   Calcium 8.4 (L) 8.9 - 10.3 mg/dL   GFR, Estimated >60 >60 mL/min   Anion gap 6 5 - 15  Magnesium     Status: None   Collection Time: 10/18/22  5:02 AM  Result Value Ref Range   Magnesium 2.1 1.7 - 2.4 mg/dL    DG Hip Port Unilat With Pelvis 1V Left  Result Date: 10/16/2022 CLINICAL DATA:   Status post pinning of a left hip fracture. EXAM: DG HIP (WITH OR WITHOUT PELVIS) 1V PORT LEFT COMPARISON:  CT obtained earlier today. FINDINGS: Interval placement of 4 pins bridging the previously demonstrated left femoral neck fracture with mild residual valgus angulation. IMPRESSION: Status post pinning of a left femoral neck fracture. Electronically Signed   By: Claudie Revering M.D.   On: 10/16/2022 20:32   DG HIP UNILAT WITH PELVIS 2-3 VIEWS LEFT  Result Date: 10/16/2022 CLINICAL DATA:  Closed fracture of left hip. EXAM: DG HIP (WITH OR WITHOUT PELVIS) 2-3V LEFT COMPARISON:  Preoperative imaging. FINDINGS: Six fluoroscopic spot views of the left hip obtained in the operating room. Four cannulated screws traverse the left femoral neck. Fluoroscopy time 1 minutes 3 seconds. Dose 5.4 mGy. IMPRESSION: Intraoperative fluoroscopy during ORIF of left femoral neck fracture. Electronically Signed   By: Keith Rake M.D.   On: 10/16/2022 18:06   DG C-Arm 1-60 Min-No Report  Result  Date: 10/16/2022 Fluoroscopy was utilized by the requesting physician.  No radiographic interpretation.    Assessment/Plan: 2 Days Post-Op   Principal Problem:   Hip fracture (HCC) Active Problems:   Alcohol abuse   Hyponatremia   Cocaine abuse (HCC)   Tobacco abuse   Chronic back pain   Protein-calorie malnutrition, severe (HCC)   Closed displaced fracture of left femoral neck ([place])   Patient will continue with physical therapy.  He is ordered for Lovenox for DVT prophylaxis.  Patient is toe-touch weightbearing on the left lower extremity with physical therapy and would benefit from a nursing facility upon discharge.   [place] , MD 10/18/2022, 5:18 PM

## 2022-10-18 NOTE — Progress Notes (Signed)
Patients family members refused vitals, stated pt was sleeping and to come back later. [facility] LPN notified

## 2022-10-18 NOTE — Progress Notes (Signed)
Physical Therapy Treatment Patient Details Name: Zachary Pugh. MRN: [id number] DOB: Oct 06, 1956 Today's Date: 10/18/2022   History of Present Illness Pt admitted for L hip fx s/p ORIF on 10/16/22 secondary to fall while going down stairs. History includes seizures, alcohol abuse, and cocaine abuse.    PT Comments    Pt is making gradual progress towards goals with ability to transfer in/out of WC with assist and treatment focused on [facility] management. Needs assist for obstacle avoidance. Pt still requires cues for safety. Will continue to progress as able.   Recommendations for follow up therapy are one component of a multi-disciplinary discharge planning process, led by the attending physician.  Recommendations may be updated based on patient status, additional functional criteria and insurance authorization.  Follow Up Recommendations  Home health PT     Assistance Recommended at Discharge Frequent or constant Supervision/Assistance  Patient can return home with the following A lot of help with walking and/or transfers;Assist for transportation;Help with stairs or ramp for entrance   Equipment Recommendations  None recommended by PT    Recommendations for Other Services       Precautions / Restrictions Precautions Precautions: Fall Restrictions Weight Bearing Restrictions: Yes LLE Weight Bearing: Touchdown weight bearing     Mobility  Bed Mobility Overal bed mobility: Needs Assistance Bed Mobility: Sit to Supine       Sit to supine: Supervision   General bed mobility comments: able to perform with safe technique.    Transfers Overall transfer level: Needs assistance Equipment used: Rolling walker (2 wheels) Transfers: Sit to/from Stand, Bed to chair/wheelchair/BSC Sit to Stand: Min assist   Step pivot transfers: Min assist       General transfer comment: improved sequencing this session secondary to decreased pain in L LE. Follows cues for [employer]. Multiple  transfers performed from recliner->WC->bed.    Ambulation/Gait Ambulation/Gait assistance: Min assist Gait Distance (Feet): 4 Feet Assistive device: Rolling walker (2 wheels) Gait Pattern/deviations: Step-to pattern       General Gait Details: focused on WC propulsion this session   Dance movement psychotherapist Wheelchair mobility: Yes Wheelchair propulsion: Both upper extremities, Both lower extermities Wheelchair parts: Needs assistance Distance: 30 Wheelchair Assistance Details (indicate cue type and reason): needed reminders for WC parts management. Pt able to self propel with B hands with cues for steering and obstacle avoidance. Improved tolerance this session  Modified Rankin (Stroke Patients Only)       Balance Overall balance assessment: History of Falls, Needs assistance Sitting-balance support: Feet supported Sitting balance-Leahy Scale: Good     Standing balance support: Bilateral upper extremity supported Standing balance-Leahy Scale: Poor                              Cognition Arousal/Alertness: Awake/alert Behavior During Therapy: WFL for tasks assessed/performed Overall Cognitive Status: Within Functional Limits for tasks assessed                                 General Comments: agreeable to session.        Exercises Other Exercises Other Exercises: written HEP given and reviewed. Pt able to perform L LE QS, AP, hip add squeezes, and hip abd/add. 10 reps with cga.    General Comments  Pertinent Vitals/Pain Pain Assessment Pain Assessment: Faces Faces Pain Scale: Hurts a little bit Pain Location: L LE Pain Descriptors / Indicators: Operative site guarding Pain Intervention(s): Limited activity within patient's tolerance, Premedicated before session    Home Living                          Prior Function            PT Goals (current goals can now be found  in the care plan section) Acute Rehab PT Goals Patient Stated Goal: to go home PT Goal Formulation: With patient Time For Goal Achievement: 10/31/22 Potential to Achieve Goals: Good Progress towards PT goals: Progressing toward goals    Frequency    BID      PT Plan Current plan remains appropriate    Co-evaluation              AM-PAC PT "6 Clicks" Mobility   Outcome Measure  Help needed turning from your back to your side while in a flat bed without using bedrails?: A Little Help needed moving from lying on your back to sitting on the side of a flat bed without using bedrails?: A Little Help needed moving to and from a bed to a chair (including a wheelchair)?: A Little Help needed standing up from a chair using your arms (e.g., wheelchair or bedside chair)?: A Little Help needed to walk in hospital room?: A Lot Help needed climbing 3-5 steps with a railing? : Total 6 Click Score: 15    End of Session Equipment Utilized During Treatment: Gait belt Activity Tolerance: Patient tolerated treatment well Patient left: in bed;with bed alarm set Nurse Communication: Mobility status PT Visit Diagnosis: Muscle weakness (generalized) (M62.81);Difficulty in walking, not elsewhere classified (R26.2);History of falling (Z91.81);Pain Pain - Right/Left: Left Pain - part of body: Hip     Time: 3785-8850 PT Time Calculation (min) (ACUTE ONLY): 15 min  Charges:  $Wheel Chair Management: 8-22 mins                     Greggory Stallion, PT, DPT, GCS [phone]    Zachary Pugh 10/18/2022, 4:27 PM

## 2022-10-18 NOTE — Evaluation (Signed)
Occupational Therapy Evaluation Patient Details Name: Zachary Pugh. MRN: [id number] DOB: 06-11-1956 Today's Date: 10/18/2022   History of Present Illness Pt admitted for L hip fx s/p ORIF on 10/16/22 secondary to fall while going down stairs. History includes seizures, alcohol abuse, and cocaine abuse.   Clinical Impression   Zachary Pugh was seen for OT evaluation this date. Prior to hospital admission, pt required supervision for all mobility using RW or SPC. Pt lives with cousin, brother, and uncle in home c ramped entrance, reports family available 24/7. Pt presents to acute OT demonstrating impaired ADL performance and functional mobility 2/2 decreased activity tolerance and functional strength/ROM/balance deficits. Pt currently requires MOD A for LB access seated in chair. MIN A + RW sit<>stand at chair, cues for hand placement and assist to maintain TTWBing pcns. MOD A step pviot chair>BSC towards L side improving to MIN A returning towards R side. SUPERVISION toileting and pericare.   Discussed with pt need for family to come in for training session. Pt demonstrates fair awareness of TTWB pcns however difficulty maintaining with fatigue. Reports plan to use w/c as needed at home. Pt would benefit from skilled OT to address functional transfers. Upon hospital discharge, recommend HHOT with 24/7 SUPERVISION for mobility, if unable to have assistance then pt is appropriate for STR.      Recommendations for follow up therapy are one component of a multi-disciplinary discharge planning process, led by the attending physician.  Recommendations may be updated based on patient status, additional functional criteria and insurance authorization.   Follow Up Recommendations  Home health OT (if unable to have 24/7 will require STR stay)    Assistance Recommended at Discharge Frequent or constant Supervision/Assistance  Patient can return home with the following A little help with walking and/or  transfers;A little help with bathing/dressing/bathroom    Functional Status Assessment  Patient has had a recent decline in their functional status and demonstrates the ability to make significant improvements in function in a reasonable and predictable amount of time.  Equipment Recommendations  BSC/3in1    Recommendations for Other Services       Precautions / Restrictions Precautions Precautions: Fall Restrictions Weight Bearing Restrictions: Yes LLE Weight Bearing: Touchdown weight bearing      Mobility Bed Mobility               General bed mobility comments: recieved and left in chair    Transfers Overall transfer level: Needs assistance Equipment used: Rolling walker (2 wheels) Transfers: Sit to/from Stand, Bed to chair/wheelchair/BSC Sit to Stand: Min assist     Step pivot transfers: Mod assist, Min assist     General transfer comment: increased difficulty turnign towards L side vs R side      Balance Overall balance assessment: History of Falls, Needs assistance Sitting-balance support: Feet supported Sitting balance-Leahy Scale: Good     Standing balance support: Bilateral upper extremity supported Standing balance-Leahy Scale: Poor                             ADL either performed or assessed with clinical judgement   ADL Overall ADL's : Needs assistance/impaired                                       General ADL Comments: MOD A for LB access seated in chair.  MIN A for BSC t/f. assist to maintain TTWBing pcns. SUPERVISION toileting and pericare.      Pertinent Vitals/Pain Pain Assessment Pain Assessment: Faces Faces Pain Scale: Hurts a little bit Pain Location: L LE Pain Descriptors / Indicators: Operative site guarding Pain Intervention(s): Limited activity within patient's tolerance, Repositioned     Hand Dominance     Extremity/Trunk Assessment Upper Extremity Assessment Upper Extremity Assessment:  Overall WFL for tasks assessed   Lower Extremity Assessment Lower Extremity Assessment: Generalized weakness       Communication Communication Communication: HOH   Cognition Arousal/Alertness: Awake/alert Behavior During Therapy: WFL for tasks assessed/performed Overall Cognitive Status: Within Functional Limits for tasks assessed                                 General Comments: Oriented to time as 2023, states month as october      Home Living Family/patient expects to be discharged to:: Private residence Living Arrangements: Other relatives Available Help at Discharge: Family;Available 24 hours/day (cousin, brother, uncle) Type of Home: House Home Access: Ramped entrance     Home Layout: One level               Home Equipment: Agricultural consultant (2 wheels);Cane - single point;Wheelchair - manual          Prior Functioning/Environment Prior Level of Function : Needs assist             Mobility Comments: reports extensive fall hx, states he never walks without help from his brother but brother recently injured himself so he has other family assist          OT Problem List: Decreased strength;Decreased range of motion;Decreased activity tolerance;Impaired balance (sitting and/or standing);Decreased safety awareness      OT Treatment/Interventions: Self-care/ADL training;Therapeutic exercise;Energy conservation;DME and/or AE instruction;Therapeutic activities;Patient/family education;Balance training    OT Goals(Current goals can be found in the care plan section) Acute Rehab OT Goals Patient Stated Goal: to go home OT Goal Formulation: With patient Time For Goal Achievement: 11/01/22 Potential to Achieve Goals: Good ADL Goals Pt Will Perform Grooming: standing;with modified independence Pt Will Perform Lower Body Dressing: with min assist;sit to/from stand Pt Will Transfer to Toilet: with supervision;stand pivot transfer;bedside commode  OT  Frequency: Min 2X/week    Co-evaluation              AM-PAC OT "6 Clicks" Daily Activity     Outcome Measure Help from another person eating meals?: None Help from another person taking care of personal grooming?: A Little Help from another person toileting, which includes using toliet, bedpan, or urinal?: A Little Help from another person bathing (including washing, rinsing, drying)?: A Lot Help from another person to put on and taking off regular upper body clothing?: A Little Help from another person to put on and taking off regular lower body clothing?: A Lot 6 Click Score: 17   End of Session Equipment Utilized During Treatment: Rolling walker (2 wheels)  Activity Tolerance: Patient tolerated treatment well Patient left: in chair;with call bell/phone within reach;with chair alarm set  OT Visit Diagnosis: Other abnormalities of gait and mobility (R26.89);Muscle weakness (generalized) (M62.81)                Time: 5956-3875 OT Time Calculation (min): 26 min Charges:  OT General Charges $OT Visit: 1 Visit OT Evaluation $OT Eval Moderate Complexity: 1 Mod OT Treatments $Self Care/Home  Management : 8-22 mins  Dessie Coma, M.S. OTR/L  10/18/22, 11:05 AM  ascom [phone]

## 2022-10-18 NOTE — Progress Notes (Signed)
  Progress Note   Patient: Zachary Pugh. KHT:[id number] DOB: 04/22/1956 DOA: 10/16/2022     2 DOS: the patient was seen and examined on 10/18/2022   Brief hospital course: Zachary Tillis. is a 66 y.o. male with medical history significant of seizure disorder, tobacco and alcohol abuse, cocaine abuse who present to the hospital with hip fracture.  Open reduction and internal fixation was performed on 11/4.   Assessment and Plan: Acute left femoral head fracture. Status post surgery fixation.  Condition had improved, patient has been seen by PT/OT, recommend home with home care.  Most likely will be discharged home tomorrow.   Hyponatremia. Alcohol abuse. Sodium normalized, no evidence of alcohol withdrawal.  Continue to follow.  Discontinue fluids.  Severe protein calorie malnutrition. Patient appears severely malnourished with BMI of 18.46 with muscle atrophy. Started protein supplements.   Cocaine abuse. Tobacco abuse. Continue to follow, patient does not want a nicotine patch.         Subjective:  Doing well, no complaints.  Able to work with PT/OT.  Physical Exam: Vitals:   10/17/22 2100 10/18/22 0021 10/18/22 0300 10/18/22 0700  BP: (!) 140/62 137/86 (!) 142/92 (!) 143/87  Pulse: 76 83 85 72  Resp:  17  18  Temp:  99 F (37.2 C)  98.1 F (36.7 C)  TempSrc:    Oral  SpO2:  95%  100%  Weight:      Height:       General exam: Appears calm and comfortable  Respiratory system: Clear to auscultation. Respiratory effort normal. Cardiovascular system: S1 & S2 heard, RRR. No JVD, murmurs, rubs, gallops or clicks. No pedal edema. Gastrointestinal system: Abdomen is nondistended, soft and nontender. No organomegaly or masses felt. Normal bowel sounds heard. Central nervous system: Alert and oriented. No focal neurological deficits. Extremities: Symmetric 5 x 5 power. Skin: No rashes, lesions or ulcers Psychiatry: Judgement and insight appear normal. Mood &  affect appropriate.   Data Reviewed:  Lab results reviewed.  Family Communication:   Disposition: Status is: Inpatient Remains inpatient appropriate because: Severity of disease, postop.  Planned Discharge Destination: Home with Home Health    Time spent: 35 minutes  Author: Sharen Hones, MD 10/18/2022 12:44 PM  For on call review [email or web address].

## 2022-10-19 DIAGNOSIS — F1410 Cocaine abuse, uncomplicated: Secondary | ICD-10-CM | POA: Diagnosis not present

## 2022-10-19 DIAGNOSIS — F1010 Alcohol abuse, uncomplicated: Secondary | ICD-10-CM | POA: Diagnosis not present

## 2022-10-19 DIAGNOSIS — S72002A Fracture of unspecified part of neck of left femur, initial encounter for closed fracture: Secondary | ICD-10-CM | POA: Diagnosis not present

## 2022-10-19 LAB — CBC
HCT: 30.4 % — ABNORMAL LOW (ref 39.0–52.0)
Hemoglobin: 10.3 g/dL — ABNORMAL LOW (ref 13.0–17.0)
MCH: 34.7 pg — ABNORMAL HIGH (ref 26.0–34.0)
MCHC: 33.9 g/dL (ref 30.0–36.0)
MCV: 102.4 fL — ABNORMAL HIGH (ref 80.0–100.0)
Platelets: 216 10*3/uL (ref 150–400)
RBC: 2.97 MIL/uL — ABNORMAL LOW (ref 4.22–5.81)
RDW: 12.2 % (ref 11.5–15.5)
WBC: 5.6 10*3/uL (ref 4.0–10.5)
nRBC: 0 % (ref 0.0–0.2)

## 2022-10-19 LAB — BASIC METABOLIC PANEL
Anion gap: 5 (ref 5–15)
BUN: 11 mg/dL (ref 8–23)
CO2: 26 mmol/L (ref 22–32)
Calcium: 8.3 mg/dL — ABNORMAL LOW (ref 8.9–10.3)
Chloride: 106 mmol/L (ref 98–111)
Creatinine, Ser: 0.92 mg/dL (ref 0.61–1.24)
GFR, Estimated: >60 mL/min (ref >60)
Glucose, Bld: 103 mg/dL — ABNORMAL HIGH (ref 70–99)
Potassium: 3.9 mmol/L (ref 3.5–5.1)
Sodium: 137 mmol/L (ref 135–145)

## 2022-10-19 LAB — MAGNESIUM: Magnesium: 2 mg/dL (ref 1.7–2.4)

## 2022-10-19 NOTE — NC FL2 (Signed)
[place] LEVEL OF CARE SCREENING TOOL     IDENTIFICATION  Patient Name: Zachary Pugh. Birthdate: 10-13-1956 Sex: male Admission Date (Current Location): 10/16/2022  [facility] and [place] Number:  Engineering geologist and Address:  [facility], [address] [place],  [address]      Provider Number: [id number]  Attending Physician Name and Address:  Sharen Hones, MD  Relative Name and Phone Number:  Dellis Filbert  [phone]    Current Level of Care: Hospital Recommended Level of Care: [place] Prior Approval Number:    Date Approved/Denied:   PASRR Number: [id number]  Discharge Plan: SNF    Current Diagnoses: Patient Active Problem List   Diagnosis Date Noted   Hip fracture ([place]) 10/16/2022   Alcohol abuse 10/16/2022   Hyponatremia 10/16/2022   Cocaine abuse ([place]) 10/16/2022   Tobacco abuse 10/16/2022   Chronic back pain 10/16/2022   Protein-calorie malnutrition, severe ([place]) 10/16/2022   Closed displaced fracture of left femoral neck ([place]) 10/16/2022   Alcohol withdrawal ([place]) 11/28/2018    Orientation RESPIRATION BLADDER Height & Weight     Self, Time, Situation, Place  Normal Continent Weight: 125 lb (56.7 kg) Height:  5\' 9"  (175.3 cm)  BEHAVIORAL SYMPTOMS/MOOD NEUROLOGICAL BOWEL NUTRITION STATUS      Continent Diet (see discharge summary)  AMBULATORY STATUS COMMUNICATION OF NEEDS Skin   Limited Assist Verbally Other (Comment) (abrasion left hip, closed surgical incision left hip)                       Personal Care Assistance Level of Assistance  Bathing, Feeding, Total care, Dressing Bathing Assistance: Limited assistance Feeding assistance: Independent Dressing Assistance: Independent Total Care Assistance: Limited assistance   Functional Limitations Info  Hearing, Speech, Sight Sight Info: Adequate Hearing Info: Adequate Speech Info: Adequate    SPECIAL CARE FACTORS  FREQUENCY  PT (By licensed PT), OT (By licensed OT)     PT Frequency: min 4x weekly OT Frequency: min 4x weekly            Contractures Contractures Info: Not present    Additional Factors Info  Code Status, Allergies Code Status Info: full Allergies Info: NKA           Current Medications (10/19/2022):  This is the current hospital active medication list Current Facility-Administered Medications  Medication Dose Route Frequency Provider Last Rate Last Admin   acetaminophen (TYLENOL) tablet 325-650 mg  325-650 mg Oral Q6H PRN [place] Park, MD       alum & mag hydroxide-simeth (MAALOX/MYLANTA) 200-200-20 MG/5ML suspension 30 mL  30 mL Oral Q4H PRN [place] Park, MD       bisacodyl (DULCOLAX) suppository 10 mg  10 mg Rectal Daily PRN [place] Park, MD       docusate sodium (COLACE) capsule 100 mg  100 mg Oral BID [place] Park, MD   100 mg at 10/19/22 0905   enoxaparin (LOVENOX) injection 30 mg  30 mg Subcutaneous Q24H [place] Park, MD   30 mg at 10/19/22 0906   feeding supplement (ENSURE ENLIVE / ENSURE PLUS) liquid 237 mL  237 mL Oral BID BM Sharen Hones, MD   237 mL at 83/15/17 6160   folic acid (FOLVITE) tablet 1 mg  1 mg Oral Daily Sharen Hones, MD   1 mg at 10/19/22 0905   HYDROcodone-acetaminophen (NORCO/VICODIN) 5-325 MG per tablet 1-2 tablet  1-2 tablet Oral Q4H PRN [place] Park, MD  1 tablet at 10/19/22 0904   levETIRAcetam (KEPPRA) tablet 500 mg  500 mg Oral BID Marrion Coy, MD   500 mg at 10/19/22 0908   methocarbamol (ROBAXIN) tablet 500 mg  500 mg Oral Q6H PRN [name], MD   500 mg at 10/18/22 1052   Or   methocarbamol (ROBAXIN) 500 mg in dextrose 5 % 50 mL IVPB  500 mg Intravenous Q6H PRN [name], MD       morphine (PF) 2 MG/ML injection 0.5-1 mg  0.5-1 mg Intravenous Q2H PRN [name], MD       multivitamin with minerals tablet 1 tablet  1 tablet Oral Daily Marrion Coy, MD   1 tablet at 10/19/22 0905   ondansetron  (ZOFRAN) tablet 4 mg  4 mg Oral Q6H PRN [name], MD       Or   ondansetron [facility]) injection 4 mg  4 mg Intravenous Q6H PRN [name], MD       polyethylene glycol (MIRALAX / GLYCOLAX) packet 17 g  17 g Oral Daily PRN [name], MD       senna (SENOKOT) tablet 8.6 mg  1 tablet Oral BID [name], MD   8.6 mg at 10/19/22 7106   thiamine (VITAMIN B1) tablet 100 mg  100 mg Oral Daily Marrion Coy, MD   100 mg at 10/19/22 2694   Or   thiamine (VITAMIN B1) injection 100 mg  100 mg Intravenous Daily Marrion Coy, MD       traMADol Janean Sark) tablet 50 mg  50 mg Oral Q6H [name], MD   50 mg at 10/19/22 1231     Discharge Medications: Please see discharge summary for a list of discharge medications.  Relevant Imaging Results:  Relevant Lab Results:   Additional Information SSN: [id number]  Darolyn Rua, LCSW

## 2022-10-19 NOTE — Progress Notes (Signed)
  Progress Note   Patient: Zachary Pugh. AST:[id number] DOB: November 24, 1956 DOA: 10/16/2022     3 DOS: the patient was seen and examined on 10/19/2022   Brief hospital course: Emir Nack. is a 66 y.o. male with medical history significant of seizure disorder, tobacco and alcohol abuse, cocaine abuse who present to the hospital with hip fracture.  Open reduction and internal fixation was performed on 11/4. He is working with physical therapy, sodium level normalized.  Currently pending nursing home placement.   Assessment and Plan: Acute left femoral head fracture. Status post surgery fixation.  Condition improved.  However, PT/OT now recommending nursing placement.  TOC is working on preauthorization.   Hyponatremia. Alcohol abuse. No evidence of alcohol withdrawal, sodium level normalized.   Severe protein calorie malnutrition. Patient appears severely malnourished with BMI of 18.46 with muscle atrophy. Started protein supplements.   Cocaine abuse. Tobacco abuse. Advised to quit.      Subjective:  Patient doing well, no complaint.  Pain under control.  Physical Exam: Vitals:   10/18/22 0300 10/18/22 0700 10/19/22 0020 10/19/22 0728  BP: (!) 142/92 (!) 143/87 138/84 132/80  Pulse: 85 72 75 (!) 54  Resp:  18 18 16   Temp:  98.1 F (36.7 C) 98.2 F (36.8 C) 97.9 F (36.6 C)  TempSrc:  Oral Oral   SpO2:  100% 100% 99%  Weight:      Height:       General exam: Appears calm and comfortable, severely malnourished. Respiratory system: Clear to auscultation. Respiratory effort normal. Cardiovascular system: S1 & S2 heard, RRR. No JVD, murmurs, rubs, gallops or clicks. No pedal edema. Gastrointestinal system: Abdomen is nondistended, soft and nontender. No organomegaly or masses felt. Normal bowel sounds heard. Central nervous system: Alert and oriented. No focal neurological deficits. Extremities: Significant muscle atrophy. Skin: No rashes, lesions or  ulcers Psychiatry: Judgement and insight appear normal. Mood & affect appropriate.   Data Reviewed:  Lab results reviewed.  Family Communication: Not able to reach brother  Disposition: Status is: Inpatient Remains inpatient appropriate because: Unsafe discharge, pending nursing placement.  Planned Discharge Destination: Skilled nursing facility    Time spent: 35 minutes  Author: Sharen Hones, MD 10/19/2022 11:10 AM  For on call review [email or web address].

## 2022-10-19 NOTE — Progress Notes (Addendum)
Physical Therapy Treatment Patient Details Name: Zachary Pugh. MRN: [id number] DOB: Jan 24, 1956 Today's Date: 10/19/2022   History of Present Illness Pt admitted for L hip fx s/p ORIF on 10/16/22 secondary to fall while going down stairs. History includes seizures, alcohol abuse, and cocaine abuse.    PT Comments    Pt was pleasant and motivated to participate during the session and put forth good effort throughout. Pt demonstrated good control and stability during squat pivot transfer training but continued to require heavy cuing to ensure proper sequencing for WB compliance.  Pt put forth good effort with below therex with no adverse symptoms noted during the session other than min to mod L hip pain.  Pt reported no pain at rest pre or post session.  Pt will benefit from PT services in a SNF setting upon discharge to safely address deficits listed in patient problem list for decreased caregiver assistance and eventual return to PLOF.     Recommendations for follow up therapy are one component of a multi-disciplinary discharge planning process, led by the attending physician.  Recommendations may be updated based on patient status, additional functional criteria and insurance authorization.  Follow Up Recommendations  Addendum: SNF  Can patient physically be transported by private vehicle: No (Would need skilled assistance for weight bearing compliance)   Assistance Recommended at Discharge Frequent or constant Supervision/Assistance  Patient can return home with the following A lot of help with walking and/or transfers;Assist for transportation;Help with stairs or ramp for entrance   Equipment Recommendations  None recommended by PT    Recommendations for Other Services       Precautions / Restrictions Precautions Precautions: Fall Restrictions Weight Bearing Restrictions: Yes LLE Weight Bearing: Touchdown weight bearing     Mobility  Bed Mobility Overal bed mobility:  Needs Assistance         Sit to supine: Supervision   General bed mobility comments: Significant time, effort, and use of BUE's to assist LLE into bed    Transfers Overall transfer level: Needs assistance Equipment used: Rolling walker (2 wheels) Transfers: Sit to/from Stand Sit to Stand: Min guard     Squat pivot transfers: Min guard     General transfer comment: Mod verbal and visual cues for sequencing for squat pivot transfer training    Ambulation/Gait Ambulation/Gait assistance: Min assist Gait Distance (Feet): 4 Feet Assistive device: Rolling walker (2 wheels) Gait Pattern/deviations: Step-to pattern, Decreased step length - right, Decreased stance time - left, Trunk flexed Gait velocity: decreased     General Gait Details: NT this session   Stairs             Wheelchair Mobility    Modified Rankin (Stroke Patients Only)       Balance Overall balance assessment: History of Falls, Needs assistance Sitting-balance support: Feet supported Sitting balance-Leahy Scale: Good     Standing balance support: Bilateral upper extremity supported, During functional activity, Reliant on assistive device for balance Standing balance-Leahy Scale: Poor                              Cognition Arousal/Alertness: Awake/alert Behavior During Therapy: WFL for tasks assessed/performed Overall Cognitive Status: Within Functional Limits for tasks assessed  Exercises Total Joint Exercises Ankle Circles/Pumps: AROM, Strengthening, Both, 10 reps, 5 reps Quad Sets: Strengthening, Both, 10 reps, 5 reps Gluteal Sets: Strengthening, Both, 10 reps, 5 reps Short Arc Quad: Strengthening, Both, 10 reps Heel Slides: Strengthening, Right, 5 reps Hip ABduction/ADduction: AAROM, Strengthening, Left, 10 reps Straight Leg Raises: AAROM, Strengthening, Left, 10 reps Long Arc Quad: Strengthening, Both, 10 reps,  AROM, 5 reps Knee Flexion: Strengthening, Both, 10 reps, AROM, 5 reps Other Exercises Other Exercises: HEP review per handout    General Comments        Pertinent Vitals/Pain Pain Assessment Pain Assessment: 0-10 Pain Score: 2  Pain Location: L LE Pain Descriptors / Indicators: Aching, Sore Pain Intervention(s): Premedicated before session, Monitored during session    Home Living                          Prior Function            PT Goals (current goals can now be found in the care plan section) Progress towards PT goals: Progressing toward goals    Frequency    BID      PT Plan Current plan remains appropriate    Co-evaluation              AM-PAC PT "6 Clicks" Mobility   Outcome Measure  Help needed turning from your back to your side while in a flat bed without using bedrails?: A Little Help needed moving from lying on your back to sitting on the side of a flat bed without using bedrails?: A Little Help needed moving to and from a bed to a chair (including a wheelchair)?: A Little Help needed standing up from a chair using your arms (e.g., wheelchair or bedside chair)?: A Little Help needed to walk in hospital room?: A Lot Help needed climbing 3-5 steps with a railing? : Total 6 Click Score: 15    End of Session Equipment Utilized During Treatment: Gait belt Activity Tolerance: Patient tolerated treatment well Patient left: in bed;with call bell/phone within reach;with bed alarm set;with SCD's reapplied Nurse Communication: Mobility status PT Visit Diagnosis: Muscle weakness (generalized) (M62.81);Difficulty in walking, not elsewhere classified (R26.2);History of falling (Z91.81);Pain Pain - Right/Left: Left Pain - part of body: Hip     Time: 6606-3016 PT Time Calculation (min) (ACUTE ONLY): 24 min  Charges:  $Therapeutic Exercise: 8-22 mins $Therapeutic Activity: 8-22 mins                     D. Scott Lindsay Straka PT, DPT 10/19/22,  3:26 PM

## 2022-10-19 NOTE — Progress Notes (Addendum)
  Subjective:  POD #3 s/p cutaneous fixation for left femoral neck hip fracture.   Patient reports left lower pain as moderate to severe.  Patient is seen with his bed covers over his head again today.  She follows commands on exam.  Objective:   VITALS:   Vitals:   10/18/22 0300 10/18/22 0700 10/19/22 0020 10/19/22 0728  BP: (!) 142/92 (!) 143/87 138/84 132/80  Pulse: 85 72 75 (!) 54  Resp:  18 18 16   Temp:  98.1 F (36.7 C) 98.2 F (36.8 C) 97.9 F (36.6 C)  TempSrc:  Oral Oral   SpO2:  100% 100% 99%  Weight:      Height:        PHYSICAL EXAM: Left lower extremity Sensation intact distally Intact pulses distally Dorsiflexion/Plantar flexion intact Incision: no drainage No cellulitis present Compartment soft  LABS  Results for orders placed or performed during the hospital encounter of 10/16/22 (from the past 24 hour(s))  CBC     Status: Abnormal   Collection Time: 10/19/22  5:45 AM  Result Value Ref Range   WBC 5.6 4.0 - 10.5 K/uL   RBC 2.97 (L) 4.22 - 5.81 MIL/uL   Hemoglobin 10.3 (L) 13.0 - 17.0 g/dL   HCT 30.4 (L) 39.0 - 52.0 %   MCV 102.4 (H) 80.0 - 100.0 fL   MCH 34.7 (H) 26.0 - 34.0 pg   MCHC 33.9 30.0 - 36.0 g/dL   RDW 12.2 11.5 - 15.5 %   Platelets 216 150 - 400 K/uL   nRBC 0.0 0.0 - 0.2 %  Basic metabolic panel     Status: Abnormal   Collection Time: 10/19/22  5:45 AM  Result Value Ref Range   Sodium 137 135 - 145 mmol/L   Potassium 3.9 3.5 - 5.1 mmol/L   Chloride 106 98 - 111 mmol/L   CO2 26 22 - 32 mmol/L   Glucose, Bld 103 (H) 70 - 99 mg/dL   BUN 11 8 - 23 mg/dL   Creatinine, Ser 0.92 0.61 - 1.24 mg/dL   Calcium 8.3 (L) 8.9 - 10.3 mg/dL   GFR, Estimated >60 >60 mL/min   Anion gap 5 5 - 15  Magnesium     Status: None   Collection Time: 10/19/22  5:45 AM  Result Value Ref Range   Magnesium 2.0 1.7 - 2.4 mg/dL    No results found.  Assessment/Plan: 3 Days Post-Op   Principal Problem:   Hip fracture (HCC) Active Problems:   Alcohol  abuse   Hyponatremia   Cocaine abuse (HCC)   Tobacco abuse   Chronic back pain   Protein-calorie malnutrition, severe (HCC)   Closed displaced fracture of left femoral neck (HCC)   Patient remained stable postop.  Patient is toe-touch weightbearing on the left lower extremity.  Patient will need a skilled nursing facility upon discharge.  Continue Lovenox for DVT prophylaxis.  Patient will follow-up in my office in 10 to 14 days after discharge for staple removal and wound check.  Patient may be discharged from orthopedic standpoint once he is cleared medically and should remain on Lovenox until his follow-up.   Thornton Park , MD 10/19/2022, 2:55 PM

## 2022-10-19 NOTE — Progress Notes (Signed)
OT Cancellation Note  Patient Details Name: Zachary Pugh. MRN: [id number] DOB: Sep 24, 1956   Cancelled Treatment:    Reason Eval/Treat Not Completed: Other (comment). Chart reviewed. Upon arrival pt reclined in bed, offered assistance to bathroom however pt does not coherently answer. Difficulty following commands at this time. Will re-attempt later.   Dessie Coma, M.S. OTR/L  10/19/22, 4:05 PM  ascom [phone]

## 2022-10-19 NOTE — Progress Notes (Signed)
Nutrition Follow-up  DOCUMENTATION CODES:   Underweight, Severe malnutrition in context of chronic illness  INTERVENTION:   -Continue Ensure Enlive po BID, each supplement provides 350 kcal and 20 grams of protein -Continue MVI with minerals daily  NUTRITION DIAGNOSIS:   Severe Malnutrition related to chronic illness (polysubstance abuse (ETOH and cocaine abuse)) as evidenced by moderate fat depletion, severe fat depletion, moderate muscle depletion, severe muscle depletion.  Ongoing  GOAL:   Patient will meet greater than or equal to 90% of their needs  Progressing   MONITOR:   PO intake, Supplement acceptance  REASON FOR ASSESSMENT:   Consult Hip fracture protocol  ASSESSMENT:   66 y.o. male with medical history of seizure disorder, stroke, tobacco and alcohol abuse (6-7 beers/day), and cocaine use. He presented to the ED after falling down the steps while drinking alcohol. In the ED he was found to have a L hip fracture. Orthopedics was consulted and patient is pending surgery.  11/4- s/p percutaneous fixation of lt femoral neck hip fracture   Reviewed I/O's: -550 ml x 24 hours and -1.9 L since admission  UOP: 500 ml x 24 hours   Pt sitting up in bed with blankets covering himself at time of visit. Pt allowed RD to remove blanket to examine pt. Pt kept eyes closed at time of interview and was not very interactive with this RD. Pt responded to some close ended questions. He shares he has a good appetite and Ensure supplements are "okay". Noted pt consumed 100% of breakfast and he drank about 50% of Ensure. Documented meal completions 100%.   Reviewed wt hx; pt has experienced a 0.9% wt loss over the past 3  weeks, which is not significant for time frame.   Per MD notes, plan SNF placement once medically stable.   Medications reviewed and include folic acid, keppra, and thiamine.   Labs reviewed: CBGS: 112 (inpatient orders for glycemic control are none).     NUTRITION - FOCUSED PHYSICAL EXAM:  Flowsheet Row Most Recent Value  Orbital Region Moderate depletion  Upper Arm Region Severe depletion  Thoracic and Lumbar Region Severe depletion  Buccal Region Severe depletion  Temple Region Moderate depletion  Clavicle Bone Region Severe depletion  Clavicle and Acromion Bone Region Severe depletion  Scapular Bone Region Severe depletion  Dorsal Hand Severe depletion  Patellar Region Severe depletion  Anterior Thigh Region Severe depletion  Posterior Calf Region Severe depletion  Edema (RD Assessment) None  Hair Reviewed  Eyes Reviewed  Mouth Reviewed  Skin Reviewed  Nails Reviewed       Diet Order:   Diet Order             Diet regular Room service appropriate? Yes; Fluid consistency: Thin  Diet effective now                   EDUCATION NEEDS:   Education needs have been addressed  Skin:  Skin Assessment: Skin Integrity Issues: Skin Integrity Issues:: Incisions Incisions: L hip (11/4)  Last BM:  10/17/22  Height:   Ht Readings from Last 1 Encounters:  10/16/22 5\' 9"  (1.753 m)    Weight:   Wt Readings from Last 1 Encounters:  10/16/22 56.7 kg    Ideal Body Weight:  72.7 kg  BMI:  Body mass index is 18.46 kg/m.  Estimated Nutritional Needs:   Kcal:  2000-2200  Protein:  115-130 grams  Fluid:  > 2 L    Raziah Funnell W, RD,  LDN, [place] Registered Dietitian II Certified Diabetes Care and Education Specialist Please refer to [facility] for RD and/or RD on-call/weekend/after hours pager

## 2022-10-19 NOTE — Anesthesia Postprocedure Evaluation (Signed)
Anesthesia Post Note  Patient: Giordan Fordham.  Procedure(s) Performed: PERCUTANEOUS FIXATION OF FEMORAL NECK (Left: Hip)  Patient location during evaluation: PACU Anesthesia Type: General Level of consciousness: awake and alert Pain management: pain level controlled Vital Signs Assessment: post-procedure vital signs reviewed and stable Respiratory status: spontaneous breathing, nonlabored ventilation, respiratory function stable and patient connected to nasal cannula oxygen Cardiovascular status: blood pressure returned to baseline and stable Postop Assessment: no apparent nausea or vomiting Anesthetic complications: no   No notable events documented.   Last Vitals:  Vitals:   10/19/22 0020 10/19/22 0728  BP: 138/84 132/80  Pulse: 75 (!) 54  Resp: 18 16  Temp: 36.8 C 36.6 C  SpO2: 100% 99%    Last Pain:  Vitals:   10/19/22 0949  TempSrc:   PainSc: 0-No pain                 Martha Clan

## 2022-10-19 NOTE — Care Management Important Message (Signed)
Important Message  Patient Details  Name: Zachary Pugh. MRN: [id number] Date of Birth: 1956-01-08   Medicare Important Message Given:  N/A - LOS <3 / Initial given by admissions     Juliann Pulse A Lashante Fryberger 10/19/2022, 9:26 AM

## 2022-10-19 NOTE — Progress Notes (Signed)
Physical Therapy Treatment Patient Details Name: Zachary Pugh. MRN: [id number] DOB: 06-10-1956 Today's Date: 10/19/2022   History of Present Illness Pt admitted for L hip fx s/p ORIF on 10/16/22 secondary to fall while going down stairs. History includes seizures, alcohol abuse, and cocaine abuse.    PT Comments    Pt was pleasant and motivated to participate during the session and put forth good effort throughout. Pt required significant time and effort with bed mobility tasks this session and the use of his BUE's to assist with LLE mobility.  Pt able to transfer and ambulate with significant multi-modal cuing for proper sequencing for WB compliance and was only able to amb a max of around 4 feet before fatiguing and needing to return to sitting.  Pt reported no adverse symptoms other than L hip pain with SpO2 and HR WNL on room air.  Spoke to pt's brother via phone call with brother stating that he has a current back problem that has led to him being mostly wheel chair bound and he would not be able to offer the patient any physical assist at home.  Pt is at a high risk for falls and for non compliance with WB status without physical assist and would not be safe to return to his prior living situation at this time.  Pt will benefit from PT services in a SNF setting upon discharge to safely address deficits listed in patient problem list for decreased caregiver assistance and eventual return to PLOF.     Recommendations for follow up therapy are one component of a multi-disciplinary discharge planning process, led by the attending physician.  Recommendations may be updated based on patient status, additional functional criteria and insurance authorization.  Follow Up Recommendations  Skilled nursing-short term rehab (<3 hours/day) Can patient physically be transported by private vehicle: No (Would need skilled assistance for weight bearing compliance)   Assistance Recommended at Discharge  Frequent or constant Supervision/Assistance  Patient can return home with the following A lot of help with walking and/or transfers;Assist for transportation;Help with stairs or ramp for entrance   Equipment Recommendations  None recommended by PT    Recommendations for Other Services       Precautions / Restrictions Precautions Precautions: Fall Restrictions Weight Bearing Restrictions: Yes LLE Weight Bearing: Touchdown weight bearing     Mobility  Bed Mobility Overal bed mobility: Needs Assistance         Sit to supine: Supervision   General bed mobility comments: Significant time, effort, use of rails, and use of BUE's to assist LLE out of bed    Transfers Overall transfer level: Needs assistance Equipment used: Rolling walker (2 wheels) Transfers: Sit to/from Stand Sit to Stand: Min guard     Squat pivot transfers: Min guard     General transfer comment: Mod verbal and visual cues for sequencing for SPT and sit to stand transfer training; this PT's foot placed under patient's L foot during sit to stand to ensure WB compliance    Ambulation/Gait Ambulation/Gait assistance: Min assist Gait Distance (Feet): 4 Feet Assistive device: Rolling walker (2 wheels) Gait Pattern/deviations: Step-to pattern, Decreased step length - right, Decreased stance time - left, Trunk flexed Gait velocity: decreased     General Gait Details: Pt able to amb around 4 feet near the EOB with max multi-modal cues for proper sequencing for WB compliance   Stairs             Wheelchair Mobility  Modified Rankin (Stroke Patients Only)       Balance Overall balance assessment: History of Falls, Needs assistance Sitting-balance support: Feet supported Sitting balance-Leahy Scale: Good     Standing balance support: Bilateral upper extremity supported, During functional activity, Reliant on assistive device for balance Standing balance-Leahy Scale: Poor                               Cognition Arousal/Alertness: Awake/alert Behavior During Therapy: WFL for tasks assessed/performed Overall Cognitive Status: Within Functional Limits for tasks assessed                                          Exercises Total Joint Exercises Ankle Circles/Pumps: AROM, Strengthening, Both, 10 reps Quad Sets: Strengthening, Both, 10 reps Gluteal Sets: Strengthening, Both, 10 reps Long Arc Quad: Strengthening, Both, 10 reps, AROM Knee Flexion: Strengthening, Both, 10 reps, AROM Other Exercises Other Exercises: HEP education/review per handout    General Comments        Pertinent Vitals/Pain Pain Assessment Pain Assessment: 0-10 Pain Score: 4  Pain Location: L LE Pain Descriptors / Indicators: Aching, Sore Pain Intervention(s): Premedicated before session, Monitored during session, Repositioned    Home Living                          Prior Function            PT Goals (current goals can now be found in the care plan section) Progress towards PT goals: Progressing toward goals    Frequency    BID      PT Plan Discharge plan needs to be updated    Co-evaluation              AM-PAC PT "6 Clicks" Mobility   Outcome Measure  Help needed turning from your back to your side while in a flat bed without using bedrails?: A Little Help needed moving from lying on your back to sitting on the side of a flat bed without using bedrails?: A Little Help needed moving to and from a bed to a chair (including a wheelchair)?: A Little Help needed standing up from a chair using your arms (e.g., wheelchair or bedside chair)?: A Little Help needed to walk in hospital room?: A Lot Help needed climbing 3-5 steps with a railing? : Total 6 Click Score: 15    End of Session Equipment Utilized During Treatment: Gait belt Activity Tolerance: Patient tolerated treatment well Patient left: in chair;with call bell/phone within  reach;with chair alarm set;with SCD's reapplied Nurse Communication: Mobility status PT Visit Diagnosis: Muscle weakness (generalized) (M62.81);Difficulty in walking, not elsewhere classified (R26.2);History of falling (Z91.81);Pain Pain - Right/Left: Left Pain - part of body: Hip     Time: 0912-0937 PT Time Calculation (min) (ACUTE ONLY): 25 min  Charges:  $Therapeutic Exercise: 8-22 mins $Therapeutic Activity: 8-22 mins                     D. Scott Millee Denise PT, DPT 10/19/22, 11:17 AM

## 2022-10-20 DIAGNOSIS — S72002A Fracture of unspecified part of neck of left femur, initial encounter for closed fracture: Secondary | ICD-10-CM | POA: Diagnosis not present

## 2022-10-20 LAB — CBC
HCT: 32.7 % — ABNORMAL LOW (ref 39.0–52.0)
Hemoglobin: 11.3 g/dL — ABNORMAL LOW (ref 13.0–17.0)
MCH: 34.9 pg — ABNORMAL HIGH (ref 26.0–34.0)
MCHC: 34.6 g/dL (ref 30.0–36.0)
MCV: 100.9 fL — ABNORMAL HIGH (ref 80.0–100.0)
Platelets: 252 10*3/uL (ref 150–400)
RBC: 3.24 MIL/uL — ABNORMAL LOW (ref 4.22–5.81)
RDW: 12 % (ref 11.5–15.5)
WBC: 5.3 10*3/uL (ref 4.0–10.5)
nRBC: 0 % (ref 0.0–0.2)

## 2022-10-20 LAB — BASIC METABOLIC PANEL
Anion gap: 4 — ABNORMAL LOW (ref 5–15)
BUN: 11 mg/dL (ref 8–23)
CO2: 27 mmol/L (ref 22–32)
Calcium: 8.6 mg/dL — ABNORMAL LOW (ref 8.9–10.3)
Chloride: 104 mmol/L (ref 98–111)
Creatinine, Ser: 0.94 mg/dL (ref 0.61–1.24)
GFR, Estimated: >60 mL/min (ref >60)
Glucose, Bld: 104 mg/dL — ABNORMAL HIGH (ref 70–99)
Potassium: 4.3 mmol/L (ref 3.5–5.1)
Sodium: 135 mmol/L (ref 135–145)

## 2022-10-20 LAB — MAGNESIUM: Magnesium: 2.2 mg/dL (ref 1.7–2.4)

## 2022-10-20 NOTE — Progress Notes (Signed)
Physical Therapy Treatment Patient Details Name: Zachary Pugh. MRN: [id number] DOB: [birth year] Today's Date: 10/20/2022   History of Present Illness Pt admitted for L hip fx s/p ORIF on 10/16/22 secondary to fall while going down stairs. History includes seizures, alcohol abuse, and cocaine abuse.    PT Comments    Pt was pleasant and motivated to participate during the session and put forth good effort throughout. Pt continued to require extra time and effort with bed mobility tasks but no physical assistance.  Pt participated in sit to/from stand transfer training from various height surfaces and required a mix of min A and CGA with less assist needed from elevated surface of bed.  Pt followed commands well and maintained LLE WB status throughout the session with no adverse symptoms other than mod LLE pain that did not worsen during the session.  Pt will benefit from PT services in a SNF setting upon discharge to safely address deficits listed in patient problem list for decreased caregiver assistance and eventual return to PLOF.     Recommendations for follow up therapy are one component of a multi-disciplinary discharge planning process, led by the attending physician.  Recommendations may be updated based on patient status, additional functional criteria and insurance authorization.  Follow Up Recommendations  Skilled nursing-short term rehab (<3 hours/day) Can patient physically be transported by private vehicle: No   Assistance Recommended at Discharge Frequent or constant Supervision/Assistance  Patient can return home with the following A lot of help with walking and/or transfers;Assist for transportation;Help with stairs or ramp for entrance;Assistance with cooking/housework   Equipment Recommendations  None recommended by PT    Recommendations for Other Services       Precautions / Restrictions Precautions Precautions: Fall Restrictions Weight Bearing Restrictions:  Yes LLE Weight Bearing: Touchdown weight bearing     Mobility  Bed Mobility Overal bed mobility: Needs Assistance       Supine to sit: Supervision Sit to supine: Supervision   General bed mobility comments: Significant time, effort, and use of BUE's to assist LLE into bed    Transfers Overall transfer level: Needs assistance Equipment used: Rolling walker (2 wheels) Transfers: Sit to/from Stand Sit to Stand: Min assist           General transfer comment: Mod verbal and visual cues for hand placement and sequencing to ensure WB compliance with min A to come to full upright standing from the recliner    Ambulation/Gait Ambulation/Gait assistance: Min assist Gait Distance (Feet): 4 Feet x 2 Assistive device: Rolling walker (2 wheels) Gait Pattern/deviations: Trunk flexed Gait velocity: decreased     General Gait Details: Pt able to ambulate with hop-to gait pattern 2 x 4 feet with mod verbal and visual cues for sequencing and min A for stability, WB compliance maintained throughout   Stairs             Wheelchair Mobility    Modified Rankin (Stroke Patients Only)       Balance Overall balance assessment: History of Falls, Needs assistance Sitting-balance support: Feet supported Sitting balance-Leahy Scale: Good     Standing balance support: Bilateral upper extremity supported, During functional activity, Reliant on assistive device for balance Standing balance-Leahy Scale: Poor                              Cognition Arousal/Alertness: Awake/alert Behavior During Therapy: WFL for tasks assessed/performed Overall Cognitive Status: Within  Functional Limits for tasks assessed                                          Exercises Total Joint Exercises Ankle Circles/Pumps: AROM, Strengthening, Both, 10 reps Quad Sets: Strengthening, Both, 10 reps Gluteal Sets: Strengthening, Both, 10 reps Hip ABduction/ADduction: AAROM,  Strengthening, 10 reps, Left Long Arc Quad: Strengthening, Both, 10 reps, AROM Knee Flexion: Strengthening, Both, 10 reps, AROM Other Exercises: Static standing at EOB 2 x 90 sec for increased activity tolerance Other Exercises: Sit to/from transfer training from recliner, bed, and BSC    General Comments        Pertinent Vitals/Pain Pain Assessment Pain Assessment: 0-10 Pain Score: 4  Pain Location: L LE Pain Descriptors / Indicators: Aching, Sore Pain Intervention(s): Premedicated before session, Monitored during session, Repositioned    Home Living                          Prior Function            PT Goals (current goals can now be found in the care plan section) Progress towards PT goals: Progressing toward goals    Frequency    BID      PT Plan Current plan remains appropriate    Co-evaluation              AM-PAC PT "6 Clicks" Mobility   Outcome Measure  Help needed turning from your back to your side while in a flat bed without using bedrails?: A Little Help needed moving from lying on your back to sitting on the side of a flat bed without using bedrails?: A Little Help needed moving to and from a bed to a chair (including a wheelchair)?: A Little Help needed standing up from a chair using your arms (e.g., wheelchair or bedside chair)?: A Little Help needed to walk in hospital room?: A Lot Help needed climbing 3-5 steps with a railing? : Total 6 Click Score: 15    End of Session Equipment Utilized During Treatment: Gait belt Activity Tolerance: Patient tolerated treatment well Patient left: Other (comment) (Pt left on BSC for BM at end of session with NT in the room) Nurse Communication: Mobility status PT Visit Diagnosis: Muscle weakness (generalized) (M62.81);Difficulty in walking, not elsewhere classified (R26.2);History of falling (Z91.81);Pain Pain - Right/Left: Left Pain - part of body: Hip     Time: 0630-1601 PT Time  Calculation (min) (ACUTE ONLY): 23 min  Charges:  $Gait Training: 8-22 mins $Therapeutic Activity: 8-22 mins                     D. Scott Kayia Billinger PT, DPT 10/20/22, 5:27 PM

## 2022-10-20 NOTE — TOC Progression Note (Signed)
Transition of Care [facility]) - Progression Note    Patient Details  Name: Zachary Pugh. MRN: [id number] Date of Birth: Jun 30, 1956  [facility] of [facility]) CM/SW Contact  Marlowe Sax, RN Phone Number: 10/20/2022, 12:55 PM  Clinical Narrative:     Reached out to [facility] at [facility] and [facility], asking if they can review the patient for a bed  Expected Discharge Plan: Skilled Nursing Facility Barriers to Discharge: SNF Pending bed offer  Expected Discharge Plan and Services Expected Discharge Plan: Skilled Nursing Facility                                               Social Determinants of Health (SDOH) Interventions    Readmission Risk Interventions     No data to display

## 2022-10-20 NOTE — Progress Notes (Signed)
PROGRESS NOTE Zachary Pugh.  CWC:[id number] DOB: [birth year] DOA: 10/16/2022 PCP: [facility]   Brief Narrative/Hospital Course: 66 y.o. male with medical history significant of seizure disorder, tobacco and alcohol abuse, cocaine abuse who present to the hospital with hip fracture.  S/P Open reduction and internal fixation was performed on 11/4.  Working with PT OT. Had mild hyponatremia improved. ALSO mild anemia Hb 10.3, for admission.   SEVERE malnutrition seen by dietitian.  Plan is to continue skilled nursing facility orthopedic follow-up in 10 to 14 days for staple removal and wound check, continue Lovenox for DVT prophylaxis    Subjective: Seen and examined this morning resting comfortably.  Pain is controlled.  No new complaints. Overnight afebrile Labs this morning with stable renal function and hemoglobin 11.3 g. Assessment and Plan: Principal Problem:   Hip fracture (HCC) Active Problems:   Alcohol abuse   Hyponatremia   Cocaine abuse (HCC)   Tobacco abuse   Chronic back pain   Protein-calorie malnutrition, severe (HCC)   Closed displaced fracture of left femoral neck (HCC)   Acute left femoral head fracture status post ORIF, continue pain control DVT prophylaxis PT OT, awaiting placement Hyponatremia: Resolved Alcohol abuse: No signs of withdrawal Severe protein calorie:augment nutritional status as below.   Nutrition Problem: Severe Malnutrition Etiology: chronic illness (polysubstance abuse (ETOH and cocaine abuse)) Signs/Symptoms: moderate fat depletion, severe fat depletion, moderate muscle depletion, severe muscle depletion Interventions: Ensure Enlive (each supplement provides 350kcal and 20 grams of protein), MVI   Normocytic anemia: Likely from chronic disease.  Monitor PCP follow-up for further age-appropriate cancer screening work-up Cocaine abuse/tobacco abuse: Cessation advised  DVT prophylaxis: enoxaparin (LOVENOX) injection 30 mg Start:  10/17/22 0800 SCDs Start: 10/16/22 2148 Place TED hose Start: 10/16/22 2148 Code Status:   Code Status: Full Code Family Communication: plan of care discussed with patient/staffs at bedside. Patient status is: Inpatient because of hip fracture pending placement Level of care: Med-Surg  Dispo:Anticipated disposition: Awaiting placement  Objective: Vitals last 24 hrs: Vitals:   10/19/22 0728 10/19/22 1640 10/20/22 0000 10/20/22 0749  BP: 132/80 123/79 126/74 (!) 141/86  Pulse: (!) 54 64 66 63  Resp: 16 16 16 16   Temp: 97.9 F (36.6 C) 98.4 F (36.9 C) 98.3 F (36.8 C) 98.4 F (36.9 C)  TempSrc:   Oral   SpO2: 99% 97% 98% 98%  Weight:      Height:       Weight change:   Physical Examination: General exam:Alert awake,thin frail HEENT:Oral mucosa moist, Ear/Nose WNL grossly Respiratory system: bilaterally clear BS,no use of accessory muscle Cardiovascular system: S1 & S2 +, No JVD. Gastrointestinal system: Abdomen soft,NT,ND, BS+ Nervous System:Alert, awake, moving extremities. Extremities: LE edema neg,distal peripheral pulses palpable.  Skin: No rashes,no icterus. MSK: left femoral area w/ surgical site staples+ and intact.  Medications reviewed:  Scheduled Meds:  docusate sodium  100 mg Oral BID   enoxaparin (LOVENOX) injection  30 mg Subcutaneous Q24H   feeding supplement  237 mL Oral BID BM   folic acid  1 mg Oral Daily   levETIRAcetam  500 mg Oral BID   multivitamin with minerals  1 tablet Oral Daily   senna  1 tablet Oral BID   thiamine  100 mg Oral Daily   Or   thiamine  100 mg Intravenous Daily   traMADol  50 mg Oral Q6H  Continuous Infusions:  methocarbamol (ROBAXIN) IV      Diet Order  Diet regular Room service appropriate? Yes; Fluid consistency: Thin  Diet effective now                 Data Reviewed: I have personally reviewed following labs and imaging studies CBC: Recent Labs  Lab 10/16/22 0944 10/17/22 0612 10/18/22 0502  10/19/22 0545 10/20/22 0723  WBC 7.2 7.0 6.6 5.6 5.3  NEUTROABS 5.6  --   --   --   --   HGB 13.3 11.4* 10.6* 10.3* 11.3*  HCT 37.9* 32.2* 30.8* 30.4* 32.7*  MCV 96.9 98.8 100.7* 102.4* 100.9*  PLT 202 205 200 216 [id number]   Basic Metabolic Panel: Recent Labs  Lab 10/16/22 0944 10/17/22 0612 10/18/22 0502 10/19/22 0545 10/20/22 0723  NA 127* 132* 135 137 135  K 4.4 4.2 3.8 3.9 4.3  CL 94* 102 105 106 104  CO2 23 23 24 26 27   GLUCOSE 101* 150* 104* 103* 104*  BUN 14 14 16 11 11   CREATININE 1.07 1.13 1.02 0.92 0.94  CALCIUM 8.9 8.7* 8.4* 8.3* 8.6*  MG 2.1 2.1 2.1 2.0 2.2   GFR: Estimated Creatinine Clearance: 62 mL/min (by C-G formula based on SCr of 0.94 mg/dL). Liver Function Tests: Recent Labs  Lab 10/16/22 0944  AST 23  ALT 18  ALKPHOS 98  BILITOT 1.2  PROT 7.3  ALBUMIN 3.5  Coagulation Profile: Recent Labs  Lab 10/16/22 0944  INR 1.0  Antimicrobials: Anti-infectives (From admission, onward)    Start     Dose/Rate Route Frequency Ordered Stop   10/16/22 2300  ceFAZolin (ANCEF) IVPB 2g/100 mL premix        2 g 200 mL/hr over 30 Minutes Intravenous Every 6 hours 10/16/22 2147 10/17/22 0607   10/16/22 1549  ceFAZolin (ANCEF) 2-4 GM/100ML-% IVPB       Note to Pharmacy: Mirna Mires: cabinet override      10/16/22 1549 10/16/22 1651   10/16/22 1435  ceFAZolin (ANCEF) IVPB 2g/100 mL premix        2 g 200 mL/hr over 30 Minutes Intravenous 30 min pre-op 10/16/22 1435 10/16/22 1645     Culture/Microbiology No results found for: "SDES", "[place]", "CULT", "REPTSTATUS"  Radiology Studies: No results found.  LOS: 4 days   Antonieta Pert, MD [facility]  10/20/2022, 11:29 AM

## 2022-10-20 NOTE — Care Management Important Message (Signed)
Important Message  Patient Details  Name: Zachary Pugh. MRN: [id number] Date of Birth: 1956/07/08   Medicare Important Message Given:  N/A - LOS <3 / Initial given by admissions     Olegario Messier A Gaelan Glennon 10/20/2022, 8:51 AM

## 2022-10-20 NOTE — Progress Notes (Signed)
  Subjective:  POD #4 s/p percutaneous fixation of the left femoral neck hip fracture.   Patient reports left hip pain as mild.  Patient up out of bed to a chair today.  Objective:   VITALS:   Vitals:   10/19/22 0728 10/19/22 1640 10/20/22 0000 10/20/22 0749  BP: 132/80 123/79 126/74 (!) 141/86  Pulse: (!) 54 64 66 63  Resp: 16 16 16 16   Temp: 97.9 F (36.6 C) 98.4 F (36.9 C) 98.3 F (36.8 C) 98.4 F (36.9 C)  TempSrc:   Oral   SpO2: 99% 97% 98% 98%  Weight:      Height:        PHYSICAL EXAM: Left lower extremity Neurovascular intact Sensation intact distally Intact pulses distally Dorsiflexion/Plantar flexion intact Incision: dressing C/D/I No cellulitis present Compartment soft  LABS  Results for orders placed or performed during the hospital encounter of 10/16/22 (from the past 24 hour(s))  CBC     Status: Abnormal   Collection Time: 10/20/22  7:23 AM  Result Value Ref Range   WBC 5.3 4.0 - 10.5 K/uL   RBC 3.24 (L) 4.22 - 5.81 MIL/uL   Hemoglobin 11.3 (L) 13.0 - 17.0 g/dL   HCT 13/08/23 (L) 49.7 - 02.6 %   MCV 100.9 (H) 80.0 - 100.0 fL   MCH 34.9 (H) 26.0 - 34.0 pg   MCHC 34.6 30.0 - 36.0 g/dL   RDW 37.8 58.8 - 50.2 %   Platelets 252 150 - 400 K/uL   nRBC 0.0 0.0 - 0.2 %  Basic metabolic panel     Status: Abnormal   Collection Time: 10/20/22  7:23 AM  Result Value Ref Range   Sodium 135 135 - 145 mmol/L   Potassium 4.3 3.5 - 5.1 mmol/L   Chloride 104 98 - 111 mmol/L   CO2 27 22 - 32 mmol/L   Glucose, Bld 104 (H) 70 - 99 mg/dL   BUN 11 8 - 23 mg/dL   Creatinine, Ser 13/08/23 0.61 - 1.24 mg/dL   Calcium 8.6 (L) 8.9 - 10.3 mg/dL   GFR, Estimated 1.28 >78 mL/min   Anion gap 4 (L) 5 - 15  Magnesium     Status: None   Collection Time: 10/20/22  7:23 AM  Result Value Ref Range   Magnesium 2.2 1.7 - 2.4 mg/dL    No results found.  Assessment/Plan: 4 Days Post-Op   Principal Problem:   Hip fracture (HCC) Active Problems:   Alcohol abuse   Hyponatremia    Cocaine abuse (HCC)   Tobacco abuse   Chronic back pain   Protein-calorie malnutrition, severe (HCC)   Closed displaced fracture of left femoral neck (HCC)   Patient doing well from orthopedic standpoint.  Patient awaiting skilled nursing facility placement.  Continue Lovenox.  Continue physical therapy.  Patient will follow-up at [facility] in [place] in 10 to 14 days after discharge for wound check and staple removal.   [place] , MD 10/20/2022, 2:03 PM

## 2022-10-20 NOTE — Progress Notes (Signed)
Physical Therapy Treatment Patient Details Name: Zachary Pugh. MRN: [id number] DOB: Sep 04, 1956 Today's Date: 10/20/2022   History of Present Illness Pt admitted for L hip fx s/p ORIF on 10/16/22 secondary to fall while going down stairs. History includes seizures, alcohol abuse, and cocaine abuse.    PT Comments    Pt was pleasant and motivated to participate during the session and put forth good effort throughout. Pt required significant time and use of BUEs to manage his LLE during sup to sit but no physical assistance needed.  Pt able to perform sit to stand transfers with L foot off of the floor with fair stability and was able to perform multiple sessions of hop-to gait with consistent WB compliance.  Pt fatigued quickly during hop-to gait with a max distance of 4 feet x 2 with seated rest between sessions.  Pt reported no adverse symptoms during the session other than L hip pain with SpO2 and HR WNL on room air.  Pt will benefit from PT services in a SNF setting upon discharge to safely address deficits listed in patient problem list for decreased caregiver assistance and eventual return to PLOF.     Recommendations for follow up therapy are one component of a multi-disciplinary discharge planning process, led by the attending physician.  Recommendations may be updated based on patient status, additional functional criteria and insurance authorization.  Follow Up Recommendations  Skilled nursing-short term rehab (<3 hours/day) Can patient physically be transported by private vehicle: No   Assistance Recommended at Discharge Frequent or constant Supervision/Assistance  Patient can return home with the following A lot of help with walking and/or transfers;Assist for transportation;Help with stairs or ramp for entrance;Assistance with cooking/housework   Equipment Recommendations  None recommended by PT    Recommendations for Other Services       Precautions / Restrictions  Precautions Precautions: Fall Restrictions Weight Bearing Restrictions: Yes LLE Weight Bearing: Touchdown weight bearing     Mobility  Bed Mobility Overal bed mobility: Needs Assistance       Supine to sit: Supervision     General bed mobility comments: Significant time, effort, and use of BUE's to assist LLE out of bed    Transfers Overall transfer level: Needs assistance Equipment used: Rolling walker (2 wheels) Transfers: Sit to/from Stand Sit to Stand: Min guard           General transfer comment: Mod verbal and visual cues for sequencing to ensure WB compliance    Ambulation/Gait Ambulation/Gait assistance: Min assist Gait Distance (Feet): 4 Feet Assistive device: Rolling walker (2 wheels) Gait Pattern/deviations: Trunk flexed Gait velocity: decreased     General Gait Details: Pt able to ambulate with hop-to gait pattern 2 x 4 feet this session with mod verbal and visual cues for sequencing and min A for stability, WB compliance maintained throughout   Stairs             Wheelchair Mobility    Modified Rankin (Stroke Patients Only)       Balance Overall balance assessment: History of Falls, Needs assistance Sitting-balance support: Feet supported Sitting balance-Leahy Scale: Good     Standing balance support: Bilateral upper extremity supported, During functional activity, Reliant on assistive device for balance Standing balance-Leahy Scale: Poor                              Cognition Arousal/Alertness: Awake/alert Behavior During Therapy: WFL for tasks  assessed/performed Overall Cognitive Status: Within Functional Limits for tasks assessed                                          Exercises Total Joint Exercises Ankle Circles/Pumps: AROM, Strengthening, Both, 10 reps Quad Sets: Strengthening, Both, 10 reps Gluteal Sets: Strengthening, Both, 10 reps Hip ABduction/ADduction: AAROM, Strengthening, 10 reps,  Left Long Arc Quad: Strengthening, Both, 10 reps, AROM Knee Flexion: Strengthening, Both, 10 reps, AROM Other Exercises Other Exercises: HEP review per handout    General Comments        Pertinent Vitals/Pain Pain Assessment Pain Assessment: 0-10 Pain Score: 5  Pain Location: L LE Pain Descriptors / Indicators: Aching, Sore Pain Intervention(s): Premedicated before session, Monitored during session, Repositioned    Home Living                          Prior Function            PT Goals (current goals can now be found in the care plan section) Progress towards PT goals: Progressing toward goals    Frequency    BID      PT Plan Current plan remains appropriate    Co-evaluation              AM-PAC PT "6 Clicks" Mobility   Outcome Measure  Help needed turning from your back to your side while in a flat bed without using bedrails?: A Little Help needed moving from lying on your back to sitting on the side of a flat bed without using bedrails?: A Little Help needed moving to and from a bed to a chair (including a wheelchair)?: A Little Help needed standing up from a chair using your arms (e.g., wheelchair or bedside chair)?: A Little Help needed to walk in hospital room?: A Lot Help needed climbing 3-5 steps with a railing? : Total 6 Click Score: 15    End of Session Equipment Utilized During Treatment: Gait belt Activity Tolerance: Patient tolerated treatment well Patient left: in chair;with call bell/phone within reach;with chair alarm set Nurse Communication: Mobility status PT Visit Diagnosis: Muscle weakness (generalized) (M62.81);Difficulty in walking, not elsewhere classified (R26.2);History of falling (Z91.81);Pain Pain - Right/Left: Left Pain - part of body: Hip     Time: [id number] PT Time Calculation (min) (ACUTE ONLY): 23 min  Charges:  $Gait Training: 8-22 mins $Therapeutic Exercise: 8-22 mins                     D. Scott  Frans Valente PT, DPT 10/20/22, 10:46 AM

## 2022-10-20 NOTE — TOC Progression Note (Signed)
Transition of Care [facility]) - Progression Note    Patient Details  Name: Zachary Pugh. MRN: [id number] Date of Birth: 11/13/1956  Transition of Care [facility]) CM/SW Contact  Marlowe Sax, RN Phone Number: 10/20/2022, 10:59 AM  Clinical Narrative:    Debbora Dus sent, PASSR obtained, Fl2 completed, no bed offers yer        Expected Discharge Plan and Services                                                 Social Determinants of Health (SDOH) Interventions    Readmission Risk Interventions     No data to display

## 2022-10-21 DIAGNOSIS — S72002A Fracture of unspecified part of neck of left femur, initial encounter for closed fracture: Secondary | ICD-10-CM | POA: Diagnosis not present

## 2022-10-21 NOTE — Progress Notes (Signed)
Physical Therapy Treatment Patient Details Name: Zachary Pugh. MRN: [id number] DOB: 11-12-1956 Today's Date: 10/21/2022   History of Present Illness Pt admitted for L hip fx s/p ORIF on 10/16/22 secondary to fall while going down stairs. History includes seizures, alcohol abuse, and cocaine abuse.    PT Comments    Pt received upright in recliner only agreeable to limited participation. Declines OOB attempts. Agreeable to LE therex with encouragement. Pt requiring frequent multimodal cuing for participation and correct form/technique for exercises listed below.  Pt educated on expectations with PT and importance of mobility to improve gait, strength, independence with functional mobility. Pt verbalizes understanding, will be more participatory for afternoon session. Pt with all needs in reach. D/c recs remain appropriate.    Recommendations for follow up therapy are one component of a multi-disciplinary discharge planning process, led by the attending physician.  Recommendations may be updated based on patient status, additional functional criteria and insurance authorization.  Follow Up Recommendations  Skilled nursing-short term rehab (<3 hours/day) Can patient physically be transported by private vehicle: No   Assistance Recommended at Discharge Frequent or constant Supervision/Assistance  Patient can return home with the following A lot of help with walking and/or transfers;Assist for transportation;Help with stairs or ramp for entrance;Assistance with cooking/housework   Equipment Recommendations  None recommended by PT    Recommendations for Other Services       Precautions / Restrictions Precautions Precautions: Fall Restrictions Weight Bearing Restrictions: Yes LLE Weight Bearing: Touchdown weight bearing     Mobility  Bed Mobility               General bed mobility comments: In recliner pre and post session Patient Response: Cooperative  Transfers                    General transfer comment: declines participation    Ambulation/Gait               General Gait Details: declines participation   Social research officer, government Rankin (Stroke Patients Only)       Balance                                            Cognition Arousal/Alertness: Awake/alert Behavior During Therapy: WFL for tasks assessed/performed Overall Cognitive Status: Within Functional Limits for tasks assessed                                 General Comments: self limiting this date. Only agreeable to LE therex        Exercises Total Joint Exercises Ankle Circles/Pumps: AROM, Strengthening, Both, 10 reps Quad Sets: Strengthening, Both, 10 reps Gluteal Sets: Strengthening, Both, 10 reps Towel Squeeze: AROM, Strengthening, Both, 10 reps Short Arc Quad: Strengthening, 10 reps, Left Heel Slides: AROM, Strengthening, Left, 10 reps Hip ABduction/ADduction: AAROM, Strengthening, 10 reps, Left Straight Leg Raises: AAROM, Strengthening, Left, 10 reps Other Exercises Other Exercises: importance and participation with PT to improve independence and mobility    General Comments        Pertinent Vitals/Pain Pain Assessment Pain Assessment: Faces Faces Pain Scale: Hurts a little bit Pain Location: L LE Pain Descriptors / Indicators: Aching, Sore Pain Intervention(s): Monitored  during session    Home Living                          Prior Function            PT Goals (current goals can now be found in the care plan section) Acute Rehab PT Goals Patient Stated Goal: to go home PT Goal Formulation: With patient Time For Goal Achievement: 10/31/22 Potential to Achieve Goals: Good Progress towards PT goals: Progressing toward goals    Frequency    BID      PT Plan Current plan remains appropriate    Co-evaluation              AM-PAC PT "6 Clicks"  Mobility   Outcome Measure  Help needed turning from your back to your side while in a flat bed without using bedrails?: A Little Help needed moving from lying on your back to sitting on the side of a flat bed without using bedrails?: A Little Help needed moving to and from a bed to a chair (including a wheelchair)?: A Little Help needed standing up from a chair using your arms (e.g., wheelchair or bedside chair)?: A Little Help needed to walk in hospital room?: A Lot Help needed climbing 3-5 steps with a railing? : Total 6 Click Score: 15    End of Session   Activity Tolerance: Other (comment);Patient limited by fatigue (self limiting)   Nurse Communication: Mobility status PT Visit Diagnosis: Muscle weakness (generalized) (M62.81);Difficulty in walking, not elsewhere classified (R26.2);History of falling (Z91.81);Pain Pain - Right/Left: Left Pain - part of body: Hip     Time: 1005-1015 PT Time Calculation (min) (ACUTE ONLY): 10 min  Charges:  $Therapeutic Exercise: 8-22 mins                     Salem Caster. Fairly IV, PT, DPT Physical Therapist- [facility]  10/21/2022, 11:00 AM

## 2022-10-21 NOTE — TOC Progression Note (Signed)
Transition of Care [facility]) - Progression Note    Patient Details  Name: Zachary Pugh. MRN: [id number] Date of Birth: 11/24/1956  Transition of Care [facility]) CM/SW Contact  Marlowe Sax, RN Phone Number: 10/21/2022, 2:04 PM  Clinical Narrative:    The patient has no bed offers at this time, expanded the bedsearch to all of the [place] area, will review the bed offers once obtained   Expected Discharge Plan: Skilled Nursing Facility Barriers to Discharge: SNF Pending bed offer  Expected Discharge Plan and Services Expected Discharge Plan: Skilled Nursing Facility                                               Social Determinants of Health (SDOH) Interventions    Readmission Risk Interventions     No data to display

## 2022-10-21 NOTE — Progress Notes (Signed)
Physical Therapy Treatment Patient Details Name: Zachary Pugh. MRN: [id number] DOB: [birth year] Today's Date: 10/21/2022   History of Present Illness Pt admitted for L hip fx s/p ORIF on 10/16/22 secondary to fall while going down stairs. History includes seizures, alcohol abuse, and cocaine abuse.    PT Comments    Pt received upright in recliner agreeable to PT. Pt required re-education on WB precautions. Pt standing with supervision, endorses he is having a hard time remembering all the safe ways to perform mobility and transfers. Did require cues for hand placement. Able to perform hop-to gait on RLE, does better with intermittent LLE TTWB primarily with turns. Does require intermittent cues for RW proximity and safe hand placement with stand to sit transfers as pt attempts to sit with a significant space from buttocks to bed. Pt returning to supine in bed with education on safe ways to transfer to EOB. Pt verbalizing understanding. Pt progressing in further gait tolerance and improved WB compliance but remains limited in safe transfer techniques placing pt at high risk for falls, still performing under safe household distances. D/c recs remaining appropriate.   Recommendations for follow up therapy are one component of a multi-disciplinary discharge planning process, led by the attending physician.  Recommendations may be updated based on patient status, additional functional criteria and insurance authorization.  Follow Up Recommendations  Skilled nursing-short term rehab (<3 hours/day) Can patient physically be transported by private vehicle: No   Assistance Recommended at Discharge Frequent or constant Supervision/Assistance  Patient can return home with the following A lot of help with walking and/or transfers;Assist for transportation;Help with stairs or ramp for entrance;Assistance with cooking/housework   Equipment Recommendations  None recommended by PT    Recommendations for  Other Services       Precautions / Restrictions Precautions Precautions: Fall Restrictions Weight Bearing Restrictions: Yes LLE Weight Bearing: Touchdown weight bearing     Mobility  Bed Mobility Overal bed mobility: Needs Assistance Bed Mobility: Sit to Supine       Sit to supine: Supervision   General bed mobility comments: In recliner pre and post session Patient Response: Cooperative  Transfers Overall transfer level: Needs assistance Equipment used: Rolling walker (2 wheels) Transfers: Sit to/from Stand Sit to Stand: Supervision           General transfer comment: declines participation    Ambulation/Gait Ambulation/Gait assistance: Min guard, Min assist Gait Distance (Feet): 20 Feet Assistive device: Rolling walker (2 wheels)         General Gait Details: Hop to pattern on RLE. VC's for RW sequencing. Does demo ability to TTWB intermittently primarily with turning RW.   Stairs             Wheelchair Mobility    Modified Rankin (Stroke Patients Only)       Balance Overall balance assessment: History of Falls   Sitting balance-Leahy Scale: Good     Standing balance support: Bilateral upper extremity supported, During functional activity, Reliant on assistive device for balance Standing balance-Leahy Scale: Fair Standing balance comment: Maintains balance with RW this date.                            Cognition Arousal/Alertness: Awake/alert Behavior During Therapy: WFL for tasks assessed/performed Overall Cognitive Status: Within Functional Limits for tasks assessed  General Comments: self limiting this date. Only agreeable to LE therex        Exercises Total Joint Exercises Ankle Circles/Pumps: AROM, Strengthening, Both, 10 reps Quad Sets: Strengthening, Both, 10 reps Gluteal Sets: Strengthening, Both, 10 reps Towel Squeeze: AROM, Strengthening, Both, 10 reps Short Arc  Quad: Strengthening, 10 reps, Left Heel Slides: AROM, Strengthening, Left, 10 reps Hip ABduction/ADduction: AAROM, Strengthening, 10 reps, Left Straight Leg Raises: AAROM, Strengthening, Left, 10 reps Other Exercises Other Exercises: importance and participation with PT to improve independence and mobility    General Comments        Pertinent Vitals/Pain Pain Assessment Pain Assessment: Faces Faces Pain Scale: Hurts a little bit Pain Location: L LE Pain Descriptors / Indicators: Aching, Sore Pain Intervention(s): Monitored during session, Repositioned    Home Living                          Prior Function            PT Goals (current goals can now be found in the care plan section) Acute Rehab PT Goals Patient Stated Goal: to go home PT Goal Formulation: With patient Time For Goal Achievement: 10/31/22 Potential to Achieve Goals: Good Progress towards PT goals: Progressing toward goals    Frequency    BID      PT Plan Current plan remains appropriate    Co-evaluation              AM-PAC PT "6 Clicks" Mobility   Outcome Measure  Help needed turning from your back to your side while in a flat bed without using bedrails?: A Little Help needed moving from lying on your back to sitting on the side of a flat bed without using bedrails?: A Little Help needed moving to and from a bed to a chair (including a wheelchair)?: A Little Help needed standing up from a chair using your arms (e.g., wheelchair or bedside chair)?: A Little Help needed to walk in hospital room?: A Lot Help needed climbing 3-5 steps with a railing? : Total 6 Click Score: 15    End of Session Equipment Utilized During Treatment: Gait belt Activity Tolerance: Patient tolerated treatment well Patient left: in bed;with call bell/phone within reach;with chair alarm set Nurse Communication: Mobility status PT Visit Diagnosis: Muscle weakness (generalized) (M62.81);Difficulty in  walking, not elsewhere classified (R26.2);History of falling (Z91.81);Pain Pain - Right/Left: Left Pain - part of body: Hip     Time: 4650-3546 PT Time Calculation (min) (ACUTE ONLY): 23 min  Charges:  $Gait Training: 23-37 mins $Therapeutic Exercise: 8-22 mins                     Barnes Florek M. Fairly IV, PT, DPT Physical Therapist- [place]  [facility]  10/21/2022, 2:46 PM

## 2022-10-21 NOTE — Progress Notes (Signed)
PROGRESS NOTE Zachary Pugh.  ZDG:[id number] DOB: 1956/03/13 DOA: 10/16/2022 PCP: [facility]   Brief Narrative/Hospital Course: 66 y.o. male with medical history significant of seizure disorder, tobacco and alcohol abuse, cocaine abuse who present to the hospital with hip fracture.  S/P Open reduction and internal fixation was performed on 11/4.  Working with PT OT. Had mild hyponatremia improved. ALSO mild anemia Hb 10.3, for admission.   SEVERE malnutrition seen by dietitian.  Plan is to continue skilled nursing facility orthopedic follow-up in 10 to 14 days for staple removal and wound check, continue Lovenox for DVT prophylaxis    Subjective: Seen and examined. Resting comfortably bedside chair no nausea vomiting pain is controlled. Overnight afebrile, mild tachycardia   Assessment and Plan: Principal Problem:   Hip fracture (HCC) Active Problems:   Alcohol abuse   Hyponatremia   Cocaine abuse (HCC)   Tobacco abuse   Chronic back pain   Protein-calorie malnutrition, severe (HCC)   Closed displaced fracture of left femoral neck (HCC)   Acute left femoral head fracture: S/p ORIF, continue pain control DVT prophylaxis with Lovenox, continue medicine PT OT.  Waiting for placement.   Hyponatremia: Resolved Alcohol abuse: No signs of withdrawal-continue thiamine multivitamin and folate. Severe protein calorie:augment nutritional status with supplement as below.   Nutrition Problem: Severe Malnutrition Etiology: chronic illness (polysubstance abuse (ETOH and cocaine abuse)) Signs/Symptoms: moderate fat depletion, severe fat depletion, moderate muscle depletion, severe muscle depletion Interventions: Ensure Enlive (each supplement provides 350kcal and 20 grams of protein), MVI   Normocytic anemia: Likely from chronic disease. PCP follow-up for further age-appropriate cancer screening work-up Cocaine abuse/tobacco abuse: Cessation advised  DVT prophylaxis: enoxaparin  (LOVENOX) injection 30 mg Start: 10/17/22 0800 SCDs Start: 10/16/22 2148 Place TED hose Start: 10/16/22 2148 Code Status:   Code Status: Full Code Family Communication: plan of care discussed with patient/staffs at bedside. Patient status is: Inpatient because of hip fracture pending placement Level of care: Med-Surg  Dispo:Anticipated disposition: Awaiting placement  Objective: Vitals last 24 hrs: Vitals:   10/20/22 0000 10/20/22 0749 10/20/22 1604 10/21/22 0015  BP: 126/74 (!) 141/86 109/78 108/68  Pulse: 66 63 65 (!) 110  Resp: 16 16 18 16   Temp: 98.3 F (36.8 C) 98.4 F (36.9 C) 99 F (37.2 C) 98.1 F (36.7 C)  TempSrc: Oral     SpO2: 98% 98% 97% 90%  Weight:      Height:       Weight change:   Physical Examination: General exam: AA oriented, pleasant, weak,older appearing HEENT:Oral mucosa moist, Ear/Nose WNL grossly, dentition normal. Respiratory system: bilaterally clear BS, no use of accessory muscle Cardiovascular system: S1 & S2 +, regular rate. Gastrointestinal system: Abdomen soft, NT,ND,BS+ Nervous System:Alert, awake, moving extremities and grossly nonfocal Extremities: LE ankle edema neg lrt hip surgical site with dressing in place stable Skin: No rashes,no icterus. MSK: Normal muscle bulk,tone, power   Medications reviewed:  Scheduled Meds:  docusate sodium  100 mg Oral BID   enoxaparin (LOVENOX) injection  30 mg Subcutaneous Q24H   feeding supplement  237 mL Oral BID BM   folic acid  1 mg Oral Daily   levETIRAcetam  500 mg Oral BID   multivitamin with minerals  1 tablet Oral Daily   senna  1 tablet Oral BID   thiamine  100 mg Oral Daily   Or   thiamine  100 mg Intravenous Daily   traMADol  50 mg Oral Q6H  Continuous  Infusions:  methocarbamol (ROBAXIN) IV      Diet Order             Diet regular Room service appropriate? Yes; Fluid consistency: Thin  Diet effective now                 Data Reviewed: I have personally reviewed following  labs and imaging studies CBC: Recent Labs  Lab 10/16/22 0944 10/17/22 0612 10/18/22 0502 10/19/22 0545 10/20/22 0723  WBC 7.2 7.0 6.6 5.6 5.3  NEUTROABS 5.6  --   --   --   --   HGB 13.3 11.4* 10.6* 10.3* 11.3*  HCT 37.9* 32.2* 30.8* 30.4* 32.7*  MCV 96.9 98.8 100.7* 102.4* 100.9*  PLT 202 205 200 216 252    Basic Metabolic Panel: Recent Labs  Lab 10/16/22 0944 10/17/22 0612 10/18/22 0502 10/19/22 0545 10/20/22 0723  NA 127* 132* 135 137 135  K 4.4 4.2 3.8 3.9 4.3  CL 94* 102 105 106 104  CO2 23 23 24 26 27   GLUCOSE 101* 150* 104* 103* 104*  BUN 14 14 16 11 11   CREATININE 1.07 1.13 1.02 0.92 0.94  CALCIUM 8.9 8.7* 8.4* 8.3* 8.6*  MG 2.1 2.1 2.1 2.0 2.2    GFR: Estimated Creatinine Clearance: 62 mL/min (by C-G formula based on SCr of 0.94 mg/dL). Liver Function Tests: Recent Labs  Lab 10/16/22 0944  AST 23  ALT 18  ALKPHOS 98  BILITOT 1.2  PROT 7.3  ALBUMIN 3.5   Coagulation Profile: Recent Labs  Lab 10/16/22 0944  INR 1.0   Antimicrobials: Anti-infectives (From admission, onward)    Start     Dose/Rate Route Frequency Ordered Stop   10/16/22 2300  ceFAZolin (ANCEF) IVPB 2g/100 mL premix        2 g 200 mL/hr over 30 Minutes Intravenous Every 6 hours 10/16/22 2147 10/17/22 0607   10/16/22 1549  ceFAZolin (ANCEF) 2-4 GM/100ML-% IVPB       Note to Pharmacy: 13/05/23: cabinet override      10/16/22 1549 10/16/22 1651   10/16/22 1435  ceFAZolin (ANCEF) IVPB 2g/100 mL premix        2 g 200 mL/hr over 30 Minutes Intravenous 30 min pre-op 10/16/22 1435 10/16/22 1645     Culture/Microbiology No results found for: "SDES", "SPECREQUEST", "CULT", "REPTSTATUS"  Radiology Studies: No results found.  LOS: 5 days   13/04/23, MD [facility]  10/21/2022, 7:39 AM

## 2022-10-21 NOTE — Progress Notes (Signed)
  Subjective:  POD #5 s/p percutaneous fixation for left femoral neck hip fracture.   Patient reports left hip pain as mild.  Patient is sitting up in a chair today.  He has a history of drug and alcohol abuse but is having no signs of withdrawal.  Objective:   VITALS:   Vitals:   10/20/22 0749 10/20/22 1604 10/21/22 0015 10/21/22 0830  BP: (!) 141/86 109/78 108/68 125/77  Pulse: 63 65 (!) 110 76  Resp: 16 18 16 17   Temp: 98.4 F (36.9 C) 99 F (37.2 C) 98.1 F (36.7 C) 98.2 F (36.8 C)  TempSrc:      SpO2: 98% 97% 90% 97%  Weight:      Height:        PHYSICAL EXAM: Left lower extremity Neurovascular intact Sensation intact distally Intact pulses distally Dorsiflexion/Plantar flexion intact Incision: dressing C/D/I No cellulitis present Compartment soft  LABS  No results found for this or any previous visit (from the past 24 hour(s)).  No results found.  Assessment/Plan: 5 Days Post-Op   Principal Problem:   Hip fracture (HCC) Active Problems:   Alcohol abuse   Hyponatremia   Cocaine abuse (HCC)   Tobacco abuse   Chronic back pain   Protein-calorie malnutrition, severe (HCC)   Closed displaced fracture of left femoral neck (HCC)  Patient is doing well from an orthopedic standpoint.  He will remain toe-touch weightbearing on the left lower extremity for a total of 6 weeks postop.  Patient will need a skilled nursing facility upon discharge.  I recommend he remain on Lovenox until follow-up with me in the office in 2 weeks.  Patient will follow-up with [facility] in [place].  The office may be contacted at [phone] to make an appointment.  Patient may be discharged to a skilled nursing facility once he is cleared medically.   [phone] , MD 10/21/2022, 11:29 AM

## 2022-10-21 NOTE — Progress Notes (Signed)
Occupational Therapy Treatment Patient Details Name: Zachary Pugh. MRN: [id number] DOB: [birth year] Today's Date: 10/21/2022   History of present illness Pt admitted for L hip fx s/p ORIF on 10/16/22 secondary to fall while going down stairs. History includes seizures, alcohol abuse, and cocaine abuse.   OT comments  Zachary Pugh was seen for OT treatment on this date. Upon arrival to room pt reclined in bed, agreeable to tx. Pt requires MIN A + RW for ADL t/f, with one uncontrolled posterior LOB requiring MAX A to safely sit on bed. Pt making good progress toward goals, will continue to follow POC. Discharge recommendation updated to STR as pt will not have family assist at home.    Recommendations for follow up therapy are one component of a multi-disciplinary discharge planning process, led by the attending physician.  Recommendations may be updated based on patient status, additional functional criteria and insurance authorization.    Follow Up Recommendations  Skilled nursing-short term rehab (<3 hours/day)    Assistance Recommended at Discharge Frequent or constant Supervision/Assistance  Patient can return home with the following  A little help with walking and/or transfers;A little help with bathing/dressing/bathroom   Equipment Recommendations  BSC/3in1    Recommendations for Other Services      Precautions / Restrictions Precautions Precautions: Fall Restrictions Weight Bearing Restrictions: Yes LLE Weight Bearing: Touchdown weight bearing       Mobility Bed Mobility Overal bed mobility: Needs Assistance Bed Mobility: Sit to Supine, Supine to Sit     Supine to sit: Supervision Sit to supine: Supervision        Transfers Overall transfer level: Needs assistance Equipment used: Rolling walker (2 wheels) Transfers: Sit to/from Stand Sit to Stand: Min guard           General transfer comment: MAX A for LOB     Balance Overall balance assessment:  History of Falls, Needs assistance   Sitting balance-Leahy Scale: Good     Standing balance support: Bilateral upper extremity supported, During functional activity, Reliant on assistive device for balance Standing balance-Leahy Scale: Fair                             ADL either performed or assessed with clinical judgement   ADL Overall ADL's : Needs assistance/impaired                                       General ADL Comments: MIN A + RW for ADL t/f, with one uncontrolled posterior LOB requiring MAX A      Cognition Arousal/Alertness: Awake/alert Behavior During Therapy: WFL for tasks assessed/performed Overall Cognitive Status: Within Functional Limits for tasks assessed                                                     Pertinent Vitals/ Pain       Pain Assessment Pain Assessment: Faces Faces Pain Scale: Hurts little more Pain Location: L LE Pain Descriptors / Indicators: Aching, Sore Pain Intervention(s): Limited activity within patient's tolerance, Repositioned   Frequency  Min 2X/week        Progress Toward Goals  OT Goals(current goals can now be found in the  care plan section)  Progress towards OT goals: Progressing toward goals  Acute Rehab OT Goals Patient Stated Goal: go home OT Goal Formulation: With patient Time For Goal Achievement: 11/01/22 Potential to Achieve Goals: Good ADL Goals Pt Will Perform Grooming: standing;with modified independence Pt Will Perform Lower Body Dressing: with min assist;sit to/from stand Pt Will Transfer to Toilet: with supervision;stand pivot transfer;bedside commode  Plan Frequency remains appropriate;Discharge plan needs to be updated    Co-evaluation                 AM-PAC OT "6 Clicks" Daily Activity     Outcome Measure   Help from another person eating meals?: None Help from another person taking care of personal grooming?: A Little Help from  another person toileting, which includes using toliet, bedpan, or urinal?: A Little Help from another person bathing (including washing, rinsing, drying)?: A Lot Help from another person to put on and taking off regular upper body clothing?: A Little Help from another person to put on and taking off regular lower body clothing?: A Lot 6 Click Score: 17    End of Session    OT Visit Diagnosis: Other abnormalities of gait and mobility (R26.89);Muscle weakness (generalized) (M62.81)   Activity Tolerance Patient tolerated treatment well   Patient Left in bed;with call bell/phone within reach   Nurse Communication          Time: 1440-1451 OT Time Calculation (min): 11 min  Charges: OT General Charges $OT Visit: 1 Visit OT Treatments $Therapeutic Activity: 8-22 mins  Kathie Dike, M.S. OTR/L  10/21/22, 4:15 PM  ascom [phone]

## 2022-10-22 DIAGNOSIS — S72002A Fracture of unspecified part of neck of left femur, initial encounter for closed fracture: Secondary | ICD-10-CM | POA: Diagnosis not present

## 2022-10-22 NOTE — Progress Notes (Signed)
PROGRESS NOTE Zachary Pugh.  JJO:[id number] DOB: 28-Mar-1956 DOA: 10/16/2022 PCP: [facility]   Brief Narrative/Hospital Course: 66 y.o. male with medical history significant of seizure disorder, tobacco and alcohol abuse, cocaine abuse who present to the hospital with hip fracture.  S/P Open reduction and internal fixation was performed on 11/4.  Working with PT OT. Had mild hyponatremia improved. ALSO mild anemia Hb 10.3, for admission.   SEVERE malnutrition seen by dietitian.  Plan is to continue skilled nursing facility orthopedic follow-up in 10 to 14 days for staple removal and wound check, continue Lovenox for DVT prophylaxis    Subjective: Seen and examined.  Resting comfortably has no complaints some pain on the left hip  Assessment and Plan: Principal Problem:   Hip fracture (HCC) Active Problems:   Alcohol abuse   Hyponatremia   Cocaine abuse (HCC)   Tobacco abuse   Chronic back pain   Protein-calorie malnutrition, severe (HCC)   Closed displaced fracture of left femoral neck (HCC)   Acute left femoral head fracture: S/p ORIF, continue pain control DVT prophylaxis with Lovenox x4-week per orthopedics, continue pain control.  Awaiting on placement.  Continue PT OT. Hyponatremia: Resolved Alcohol abuse: No signs of withdrawal-continue thiamine multivitamin and folate. Severe protein calorie:augment nutritional status with supplement as below.   Nutrition Problem: Severe Malnutrition Etiology: chronic illness (polysubstance abuse (ETOH and cocaine abuse)) Signs/Symptoms: moderate fat depletion, severe fat depletion, moderate muscle depletion, severe muscle depletion Interventions: Ensure Enlive (each supplement provides 350kcal and 20 grams of protein), MVI   Normocytic anemia: Likely from chronic disease. PCP follow-up for further age-appropriate cancer screening work-up Cocaine abuse/tobacco abuse: Cessation advised  DVT prophylaxis: enoxaparin (LOVENOX)  injection 30 mg Start: 10/17/22 0800 SCDs Start: 10/16/22 2148 Place TED hose Start: 10/16/22 2148 Code Status:   Code Status: Full Code Family Communication: plan of care discussed with patient/staffs at bedside. Patient status is: Inpatient because of hip fracture pending placement Level of care: Med-Surg  Dispo:Anticipated disposition: Medically stable.  Awaiting placement  Objective: Vitals last 24 hrs: Vitals:   10/21/22 0830 10/21/22 1612 10/22/22 0100 10/22/22 0807  BP: 125/77 130/74 130/80 135/74  Pulse: 76 100 62 (!) 58  Resp: 17 17 18 18   Temp: 98.2 F (36.8 C) 98.2 F (36.8 C) 98 F (36.7 C) 97.6 F (36.4 C)  TempSrc:   Oral   SpO2: 97% 99% 96% 99%  Weight:      Height:       Weight change:   Physical Examination: General exam: alert awake, older than stated age HEENT:Oral mucosa moist, Ear/Nose WNL grossly Respiratory system: Bilaterally clear BS, no use of accessory muscle Cardiovascular system: S1 & S2 +, No JVD. Gastrointestinal system: Abdomen soft,NT,ND, BS+ Nervous System: Alert, awake, moving extremities,  Extremities: LE edema neg, left hip surgical site with dressing in place with intact staples, distal peripheral pulses palpable.  Skin: No rashes,no icterus. MSK: Normal muscle bulk,tone, power   Medications reviewed:  Scheduled Meds:  docusate sodium  100 mg Oral BID   enoxaparin (LOVENOX) injection  30 mg Subcutaneous Q24H   feeding supplement  237 mL Oral BID BM   folic acid  1 mg Oral Daily   levETIRAcetam  500 mg Oral BID   multivitamin with minerals  1 tablet Oral Daily   senna  1 tablet Oral BID   thiamine  100 mg Oral Daily   Or   thiamine  100 mg Intravenous Daily   traMADol  50 mg Oral Q6H  Continuous Infusions:  methocarbamol (ROBAXIN) IV      Diet Order             Diet regular Room service appropriate? Yes; Fluid consistency: Thin  Diet effective now                 Data Reviewed: I have personally reviewed following  labs and imaging studies CBC: Recent Labs  Lab 10/16/22 0944 10/17/22 0612 10/18/22 0502 10/19/22 0545 10/20/22 0723  WBC 7.2 7.0 6.6 5.6 5.3  NEUTROABS 5.6  --   --   --   --   HGB 13.3 11.4* 10.6* 10.3* 11.3*  HCT 37.9* 32.2* 30.8* 30.4* 32.7*  MCV 96.9 98.8 100.7* 102.4* 100.9*  PLT 202 205 200 216 252    Basic Metabolic Panel: Recent Labs  Lab 10/16/22 0944 10/17/22 0612 10/18/22 0502 10/19/22 0545 10/20/22 0723  NA 127* 132* 135 137 135  K 4.4 4.2 3.8 3.9 4.3  CL 94* 102 105 106 104  CO2 23 23 24 26 27   GLUCOSE 101* 150* 104* 103* 104*  BUN 14 14 16 11 11   CREATININE 1.07 1.13 1.02 0.92 0.94  CALCIUM 8.9 8.7* 8.4* 8.3* 8.6*  MG 2.1 2.1 2.1 2.0 2.2    GFR: Estimated Creatinine Clearance: 62 mL/min (by C-G formula based on SCr of 0.94 mg/dL). Liver Function Tests: Recent Labs  Lab 10/16/22 0944  AST 23  ALT 18  ALKPHOS 98  BILITOT 1.2  PROT 7.3  ALBUMIN 3.5   Coagulation Profile: Recent Labs  Lab 10/16/22 0944  INR 1.0   Antimicrobials: Anti-infectives (From admission, onward)    Start     Dose/Rate Route Frequency Ordered Stop   10/16/22 2300  ceFAZolin (ANCEF) IVPB 2g/100 mL premix        2 g 200 mL/hr over 30 Minutes Intravenous Every 6 hours 10/16/22 2147 10/17/22 0607   10/16/22 1549  ceFAZolin (ANCEF) 2-4 GM/100ML-% IVPB       Note to Pharmacy: 13/05/23: cabinet override      10/16/22 1549 10/16/22 1651   10/16/22 1435  ceFAZolin (ANCEF) IVPB 2g/100 mL premix        2 g 200 mL/hr over 30 Minutes Intravenous 30 min pre-op 10/16/22 1435 10/16/22 1645     Culture/Microbiology No results found for: "SDES", "SPECREQUEST", "CULT", "REPTSTATUS"  Radiology Studies: No results found.  LOS: 6 days   13/04/23, MD [facility]  10/22/2022, 10:34 AM

## 2022-10-22 NOTE — Progress Notes (Signed)
     Subjective: 6 Days Post-Op Procedure(s) (LRB): PERCUTANEOUS FIXATION OF FEMORAL NECK (Left)   Patient reports pain as mild. He has a history of drug and alcohol abuse but is having no signs of withdrawal. He feels hat he is doing quite well and happy with the results of the surgery.  States that he has been working with PT and understands his restrictions.  Objective:   VITALS:   Vitals:   10/22/22 0100 10/22/22 0807  BP: 130/80 135/74  Pulse: 62 (!) 58  Resp: 18 18  Temp: 98 F (36.7 C) 97.6 F (36.4 C)  SpO2: 96% 99%    Neurovascular intact Dorsiflexion/Plantar flexion intact Incision: scant drainage No cellulitis present Compartment soft  LABS Recent Labs    10/20/22 0723  HGB 11.3*  HCT 32.7*  WBC 5.3  PLT 252    Recent Labs    10/20/22 0723  NA 135  K 4.3  BUN 11  CREATININE 0.94  GLUCOSE 104*     Assessment/Plan: 6 Days Post-Op Procedure(s) (LRB): PERCUTANEOUS FIXATION OF FEMORAL NECK (Left)   Up with therapy Discharge to SNF when ready medically  Patient is doing well from an orthopedic standpoint.  He will remain toe-touch weightbearing on the left lower extremity for a total of 6 weeks postop.  Patient will need a skilled nursing facility upon discharge.  Recommend he remain on Lovenox until follow-up in the office in 2 weeks.  Patient will follow-up with [facility] in [place].  The office may be contacted at [phone] to make an appointment.  Patient may be discharged to a skilled nursing facility once he is cleared medically.     Lanney Gins PA-C  [employer] [facility]

## 2022-10-22 NOTE — Progress Notes (Signed)
Occupational Therapy Treatment Patient Details Name: Zachary Pugh. MRN: [id number] DOB: 06-11-1956 Today's Date: 10/22/2022   History of present illness Pt admitted for L hip fx s/p ORIF on 10/16/22 secondary to fall while going down stairs. History includes seizures, alcohol abuse, and cocaine abuse.   OT comments  Pt seen for OT tx this date. Pt in recliner, denies pain but endorses ready to return to bed. Pt politely declining ADL as part of session. Required supv/SBA for STS and hop pivot from recliner to the EOB with good adherence to LLE TTWBing precautions and 1 VC for sequencing to ensure safety prior to descent. VC for positioning in bed for optimal comfort. All needs in reach. Pt continues to benefit from skilled OT services. Continue to recommend SNF at discharge.    Recommendations for follow up therapy are one component of a multi-disciplinary discharge planning process, led by the attending physician.  Recommendations may be updated based on patient status, additional functional criteria and insurance authorization.    Follow Up Recommendations  Skilled nursing-short term rehab (<3 hours/day)    Assistance Recommended at Discharge Frequent or constant Supervision/Assistance  Patient can return home with the following  A little help with walking and/or transfers;A little help with bathing/dressing/bathroom   Equipment Recommendations  BSC/3in1    Recommendations for Other Services      Precautions / Restrictions Precautions Precautions: Fall Restrictions Weight Bearing Restrictions: Yes LLE Weight Bearing: Touchdown weight bearing       Mobility Bed Mobility Overal bed mobility: Needs Assistance Bed Mobility: Sit to Supine       Sit to supine: Supervision        Transfers Overall transfer level: Needs assistance Equipment used: Rolling walker (2 wheels) Transfers: Sit to/from Stand Sit to Stand: Supervision     Step pivot transfers: Min guard      General transfer comment: hop pivot from recliner to the EOB wiht good adherence to LLE TTWBing precautions and 1 VC for sequencing to ensure safety prior to descent     Balance Overall balance assessment: History of Falls, Needs assistance Sitting-balance support: Feet supported       Standing balance support: Bilateral upper extremity supported, During functional activity, Reliant on assistive device for balance Standing balance-Leahy Scale: Fair                             ADL either performed or assessed with clinical judgement   ADL                                              Extremity/Trunk Assessment              Vision       Perception     Praxis      Cognition   Behavior During Therapy: WFL for tasks assessed/performed Overall Cognitive Status: No family/caregiver present to determine baseline cognitive functioning                                 General Comments: PRN VC for safety        Exercises      Shoulder Instructions       General Comments      Pertinent Vitals/ Pain  Pain Assessment Pain Assessment: No/denies pain  Home Living                                          Prior Functioning/Environment              Frequency  Min 2X/week        Progress Toward Goals  OT Goals(current goals can now be found in the care plan section)  Progress towards OT goals: Progressing toward goals  Acute Rehab OT Goals Patient Stated Goal: go home OT Goal Formulation: With patient Time For Goal Achievement: 11/01/22  Plan Frequency remains appropriate;Discharge plan remains appropriate    Co-evaluation                 AM-PAC OT "6 Clicks" Daily Activity     Outcome Measure   Help from another person eating meals?: None Help from another person taking care of personal grooming?: A Little Help from another person toileting, which includes using toliet,  bedpan, or urinal?: A Little Help from another person bathing (including washing, rinsing, drying)?: A Lot Help from another person to put on and taking off regular upper body clothing?: A Little Help from another person to put on and taking off regular lower body clothing?: A Lot 6 Click Score: 17    End of Session Equipment Utilized During Treatment: Rolling walker (2 wheels)  OT Visit Diagnosis: Other abnormalities of gait and mobility (R26.89);Muscle weakness (generalized) (M62.81)   Activity Tolerance Patient tolerated treatment well   Patient Left in bed;with call bell/phone within reach;with bed alarm set   Nurse Communication          Time: 8182-9937 OT Time Calculation (min): 9 min  Charges: OT General Charges $OT Visit: 1 Visit OT Treatments $Self Care/Home Management : 8-22 mins  Arman Filter., MPH, MS, OTR/L ascom [phone] 10/22/22, 3:31 PM

## 2022-10-22 NOTE — TOC Progression Note (Signed)
Transition of Care [facility]) - Progression Note    Patient Details  Name: Zachary Pugh. MRN: [id number] Date of Birth: [birth year]  [facility] of [facility]) CM/SW Contact  Marlowe Sax, RN Phone Number: 10/22/2022, 10:54 AM  Clinical Narrative:  Still no bed offers, awaiting offer and will review for choice once obtained     Expected Discharge Plan: Skilled Nursing Facility Barriers to Discharge: SNF Pending bed offer  Expected Discharge Plan and Services Expected Discharge Plan: Skilled Nursing Facility                                               Social Determinants of Health (SDOH) Interventions    Readmission Risk Interventions     No data to display

## 2022-10-22 NOTE — Progress Notes (Signed)
Physical Therapy Treatment Patient Details Name: Zachary Pugh. MRN: [id number] DOB: 10-16-1956 Today's Date: 10/22/2022   History of Present Illness Pt admitted for L hip fx s/p ORIF on 10/16/22 secondary to fall while going down stairs. History includes seizures, alcohol abuse, and cocaine abuse.    PT Comments    Pt received in bed. Agreeable to PT with consistent encouragement and education on need to participate.  Pt progressing with mobility slowly performing transfers with supervision to RW and performing hop to pattern on RLE 30' with intermittent L TTWB during standing rests due to LE fatigue. Pt returns to recliner declining further gait participation this a.m. but is amenable to reviewing LE therex. Pt in recliner with all needs in reach. Remains appropriate with STR.   Recommendations for follow up therapy are one component of a multi-disciplinary discharge planning process, led by the attending physician.  Recommendations may be updated based on patient status, additional functional criteria and insurance authorization.  Follow Up Recommendations  Skilled nursing-short term rehab (<3 hours/day) Can patient physically be transported by private vehicle: Yes   Assistance Recommended at Discharge Frequent or constant Supervision/Assistance  Patient can return home with the following A lot of help with walking and/or transfers;Assist for transportation;Help with stairs or ramp for entrance;Assistance with cooking/housework   Equipment Recommendations  None recommended by PT    Recommendations for Other Services       Precautions / Restrictions Precautions Precautions: Fall Restrictions Weight Bearing Restrictions: Yes LLE Weight Bearing: Touchdown weight bearing     Mobility  Bed Mobility Overal bed mobility: Needs Assistance Bed Mobility: Supine to Sit     Supine to sit: Supervision       Patient Response: Cooperative  Transfers Overall transfer level:  Needs assistance Equipment used: Rolling walker (2 wheels) Transfers: Sit to/from Stand Sit to Stand: Supervision                Ambulation/Gait Ambulation/Gait assistance: Min guard Gait Distance (Feet): 30 Feet Assistive device: Rolling walker (2 wheels) Gait Pattern/deviations: Trunk flexed       General Gait Details: Hop to pattern on RLE with consistent TTWB on LLE througohut in between hops.   Stairs             Wheelchair Mobility    Modified Rankin (Stroke Patients Only)       Balance   Sitting-balance support: Feet supported Sitting balance-Leahy Scale: Good     Standing balance support: Bilateral upper extremity supported, During functional activity, Reliant on assistive device for balance Standing balance-Leahy Scale: Fair Standing balance comment: Maintains balance with RW this date.                            Cognition Arousal/Alertness: Awake/alert Behavior During Therapy: WFL for tasks assessed/performed Overall Cognitive Status: Within Functional Limits for tasks assessed                                 General Comments: Requires a lot of education and motivation to participate.        Exercises Total Joint Exercises Ankle Circles/Pumps: AROM, Strengthening, Both, 10 reps Towel Squeeze: AROM, Strengthening, Both, 10 reps Short Arc Quad: Strengthening, 10 reps, Left Heel Slides: AROM, Strengthening, Left, 10 reps Hip ABduction/ADduction: Strengthening, 10 reps, Left, AROM Other Exercises Other Exercises: Continuing to educate on importance of PT participation  for recovery    General Comments        Pertinent Vitals/Pain Pain Assessment Pain Assessment: Faces Faces Pain Scale: Hurts a little bit Pain Location: L LE Pain Descriptors / Indicators: Aching, Sore Pain Intervention(s): Limited activity within patient's tolerance, Repositioned    Home Living                          Prior  Function            PT Goals (current goals can now be found in the care plan section) Acute Rehab PT Goals Patient Stated Goal: to go home PT Goal Formulation: With patient Time For Goal Achievement: 10/31/22 Potential to Achieve Goals: Good Progress towards PT goals: Progressing toward goals    Frequency    BID      PT Plan Current plan remains appropriate    Co-evaluation              AM-PAC PT "6 Clicks" Mobility   Outcome Measure  Help needed turning from your back to your side while in a flat bed without using bedrails?: A Little Help needed moving from lying on your back to sitting on the side of a flat bed without using bedrails?: A Little Help needed moving to and from a bed to a chair (including a wheelchair)?: A Little Help needed standing up from a chair using your arms (e.g., wheelchair or bedside chair)?: A Little Help needed to walk in hospital room?: A Lot Help needed climbing 3-5 steps with a railing? : Total 6 Click Score: 15    End of Session Equipment Utilized During Treatment: Gait belt Activity Tolerance: Patient tolerated treatment well Patient left: in chair;with call bell/phone within reach;with chair alarm set Nurse Communication: Mobility status PT Visit Diagnosis: Muscle weakness (generalized) (M62.81);Difficulty in walking, not elsewhere classified (R26.2);History of falling (Z91.81);Pain Pain - Right/Left: Left Pain - part of body: Hip     Time: 3354-5625 PT Time Calculation (min) (ACUTE ONLY): 24 min  Charges:  $Gait Training: 8-22 mins $Therapeutic Exercise: 8-22 mins                     Jassica Zazueta M. Fairly IV, PT, DPT Physical Therapist- [place]  [facility]  10/22/2022, 10:48 AM

## 2022-10-22 NOTE — Plan of Care (Signed)
  Problem: Elimination: Goal: Will not experience complications related to bowel motility Outcome: Progressing   Problem: Pain Managment: Goal: General experience of comfort will improve Outcome: Progressing   Problem: Skin Integrity: Goal: Risk for impaired skin integrity will decrease Outcome: Progressing   Problem: Safety: Goal: Ability to remain free from injury will improve Outcome: Progressing   Problem: Activity: Goal: Risk for activity intolerance will decrease Outcome: Progressing

## 2022-10-22 NOTE — Progress Notes (Signed)
Physical Therapy Treatment Patient Details Name: Zachary Pugh. MRN: [id number] DOB: 10-22-1956 Today's Date: 10/22/2022   History of Present Illness Pt admitted for L hip fx s/p ORIF on 10/16/22 secondary to fall while going down stairs. History includes seizures, alcohol abuse, and cocaine abuse.    PT Comments    Pt received upright in recliner. Agreeable to PT services. Pt did require min VC's for STS for maintaining LLE WB precautions  with good carryover. Pt maintaining R hop to pattern 30' with RW with min VC's for RW proximity. Session shortened as pt has multiple family members visiting. Pt returns to recliner with VC's for safe RW sequencing, turning and reaching back with UE but performs unsafe despite cues. Re-educated on safe performance with pt verbalizing understanding. Pt placed in recliner with all needs in reach. Continue to rec STR at d/c due to gait/balance deficits, unsafe transfer techniques, and muscle weakness.    Recommendations for follow up therapy are one component of a multi-disciplinary discharge planning process, led by the attending physician.  Recommendations may be updated based on patient status, additional functional criteria and insurance authorization.  Follow Up Recommendations  Skilled nursing-short term rehab (<3 hours/day) Can patient physically be transported by private vehicle: Yes   Assistance Recommended at Discharge Frequent or constant Supervision/Assistance  Patient can return home with the following A lot of help with walking and/or transfers;Assist for transportation;Help with stairs or ramp for entrance;Assistance with cooking/housework   Equipment Recommendations  None recommended by PT    Recommendations for Other Services       Precautions / Restrictions Precautions Precautions: Fall Restrictions Weight Bearing Restrictions: Yes LLE Weight Bearing: Touchdown weight bearing     Mobility  Bed Mobility Overal bed mobility:  Needs Assistance Bed Mobility: Sit to Supine     Supine to sit: Supervision Sit to supine: Supervision     Patient Response: Cooperative  Transfers Overall transfer level: Needs assistance Equipment used: Rolling walker (2 wheels) Transfers: Sit to/from Stand Sit to Stand: Supervision           General transfer comment: VC's for LLW TTWB precautions. Good carryover after cuing    Ambulation/Gait Ambulation/Gait assistance: Min guard Gait Distance (Feet): 30 Feet Assistive device: Rolling walker (2 wheels) Gait Pattern/deviations: Trunk flexed       General Gait Details: Hop to pattern on RLE with consistent TTWB on LLE througohut in between hops.   Stairs             Wheelchair Mobility    Modified Rankin (Stroke Patients Only)       Balance Overall balance assessment: History of Falls, Needs assistance Sitting-balance support: Feet supported Sitting balance-Leahy Scale: Good     Standing balance support: Bilateral upper extremity supported, During functional activity, Reliant on assistive device for balance Standing balance-Leahy Scale: Fair Standing balance comment: Maintains balance with RW this date.                            Cognition Arousal/Alertness: Awake/alert Behavior During Therapy: WFL for tasks assessed/performed Overall Cognitive Status: Within Functional Limits for tasks assessed                                 General Comments: Requires a lot of education and motivation to participate.        Exercises Total Joint Exercises Ankle  Circles/Pumps: AROM, Strengthening, Both, 10 reps Towel Squeeze: AROM, Strengthening, Both, 10 reps Short Arc Quad: Strengthening, 10 reps, Left Heel Slides: AROM, Strengthening, Left, 10 reps Hip ABduction/ADduction: Strengthening, 10 reps, Left, AROM Long Arc Quad: Strengthening, Both, 10 reps, AROM Other Exercises Other Exercises: Continuing to educate on importance  of PT participation for recovery    General Comments        Pertinent Vitals/Pain Pain Assessment Pain Assessment: No/denies pain Faces Pain Scale: Hurts a little bit Pain Location: L LE Pain Descriptors / Indicators: Aching, Sore Pain Intervention(s): Limited activity within patient's tolerance, Repositioned    Home Living                          Prior Function            PT Goals (current goals can now be found in the care plan section) Acute Rehab PT Goals Patient Stated Goal: to go home PT Goal Formulation: With patient Time For Goal Achievement: 10/31/22 Potential to Achieve Goals: Good Progress towards PT goals: Progressing toward goals    Frequency    BID      PT Plan Current plan remains appropriate    Co-evaluation              AM-PAC PT "6 Clicks" Mobility   Outcome Measure  Help needed turning from your back to your side while in a flat bed without using bedrails?: A Little Help needed moving from lying on your back to sitting on the side of a flat bed without using bedrails?: A Little Help needed moving to and from a bed to a chair (including a wheelchair)?: A Little Help needed standing up from a chair using your arms (e.g., wheelchair or bedside chair)?: A Little Help needed to walk in hospital room?: A Lot Help needed climbing 3-5 steps with a railing? : Total 6 Click Score: 15    End of Session Equipment Utilized During Treatment: Gait belt Activity Tolerance: Patient tolerated treatment well;Other (comment) (session shortened due to family arriving to visit) Patient left: in chair;with call bell/phone within reach;with chair alarm set;with family/visitor present Nurse Communication: Mobility status PT Visit Diagnosis: Muscle weakness (generalized) (M62.81);Difficulty in walking, not elsewhere classified (R26.2);History of falling (Z91.81);Pain Pain - Right/Left: Left Pain - part of body: Hip     Time: 1340-1350 PT Time  Calculation (min) (ACUTE ONLY): 10 min  Charges:  $Gait Training: 8-22 mins $Therapeutic Exercise: 8-22 mins                     Teresea Donley M. Fairly IV, PT, DPT Physical Therapist- [place]  [facility]  10/22/2022, 2:20 PM

## 2022-10-23 DIAGNOSIS — S72002A Fracture of unspecified part of neck of left femur, initial encounter for closed fracture: Secondary | ICD-10-CM | POA: Diagnosis not present

## 2022-10-23 LAB — CBC
HCT: 34.1 % — ABNORMAL LOW (ref 39.0–52.0)
Hemoglobin: 11.5 g/dL — ABNORMAL LOW (ref 13.0–17.0)
MCH: 34.1 pg — ABNORMAL HIGH (ref 26.0–34.0)
MCHC: 33.7 g/dL (ref 30.0–36.0)
MCV: 101.2 fL — ABNORMAL HIGH (ref 80.0–100.0)
Platelets: 304 10*3/uL (ref 150–400)
RBC: 3.37 MIL/uL — ABNORMAL LOW (ref 4.22–5.81)
RDW: 12.1 % (ref 11.5–15.5)
WBC: 5.1 10*3/uL (ref 4.0–10.5)
nRBC: 0 % (ref 0.0–0.2)

## 2022-10-23 LAB — BASIC METABOLIC PANEL
Anion gap: 4 — ABNORMAL LOW (ref 5–15)
BUN: 17 mg/dL (ref 8–23)
CO2: 27 mmol/L (ref 22–32)
Calcium: 8.7 mg/dL — ABNORMAL LOW (ref 8.9–10.3)
Chloride: 107 mmol/L (ref 98–111)
Creatinine, Ser: 0.99 mg/dL (ref 0.61–1.24)
GFR, Estimated: >60 mL/min (ref >60)
Glucose, Bld: 102 mg/dL — ABNORMAL HIGH (ref 70–99)
Potassium: 4.3 mmol/L (ref 3.5–5.1)
Sodium: 138 mmol/L (ref 135–145)

## 2022-10-23 LAB — MAGNESIUM: Magnesium: 2.2 mg/dL (ref 1.7–2.4)

## 2022-10-23 MED ORDER — HYDRALAZINE HCL 20 MG/ML IJ SOLN: 10.0000 mg | INTRAMUSCULAR | Status: DC | PRN

## 2022-10-23 MED ORDER — IPRATROPIUM-ALBUTEROL 0.5-2.5 (3) MG/3ML IN SOLN: 3.0000 mL | RESPIRATORY_TRACT | Status: DC | PRN

## 2022-10-23 MED ORDER — ENOXAPARIN SODIUM 40 MG/0.4ML IJ SOSY
40.0000 mg | PREFILLED_SYRINGE | INTRAMUSCULAR | Status: DC
  Administered 2022-10-24 – 2022-10-26 (×3): 40 mg via SUBCUTANEOUS
  Filled 2022-10-23 (×3): qty 0.4

## 2022-10-23 MED ORDER — TRAZODONE HCL 50 MG PO TABS: 50.0000 mg | ORAL_TABLET | Freq: Every evening | ORAL | Status: DC | PRN

## 2022-10-23 MED ORDER — METOPROLOL TARTRATE 5 MG/5ML IV SOLN: 5.0000 mg | INTRAVENOUS | Status: DC | PRN

## 2022-10-23 MED ORDER — GUAIFENESIN 100 MG/5ML PO LIQD: 5.0000 mL | ORAL | Status: DC | PRN

## 2022-10-23 NOTE — Progress Notes (Signed)
     Subjective: 7 Days Post-Op Procedure(s) (LRB): PERCUTANEOUS FIXATION OF FEMORAL NECK (Left)   Patient is comfortable and states his pain is relatively well controlled.  States that he has been working with PT and understands his restrictions.  Objective:   VITALS:   Vitals:   10/22/22 1456 10/22/22 2327  BP: 122/77 115/73  Pulse: 76 (!) 57  Resp: 16 20  Temp: 97.7 F (36.5 C) 98 F (36.7 C)  SpO2: 97% 97%    Neurovascular intact Dorsiflexion/Plantar flexion intact Incision: scant drainage No cellulitis present Compartment soft  LABS Recent Labs    10/20/22 0723  HGB 11.3*  HCT 32.7*  WBC 5.3  PLT 252     Recent Labs    10/20/22 0723  NA 135  K 4.3  BUN 11  CREATININE 0.94  GLUCOSE 104*      Assessment/Plan: 7 Days Post-Op Procedure(s) (LRB): PERCUTANEOUS FIXATION OF FEMORAL NECK (Left)   Up with therapy Discharge to SNF when ready medically  Patient is doing well from an orthopedic standpoint.  He will remain toe-touch weightbearing on the left lower extremity for a total of 6 weeks postop.  Patient will need a skilled nursing facility upon discharge.  Recommend he remain on Lovenox until follow-up in the office in 2 weeks.  Patient will follow-up with [facility] in [place].  The office may be contacted at [phone] to make an appointment.  Patient may be discharged to a skilled nursing facility once he is cleared medically.    Ross Marcus

## 2022-10-23 NOTE — Progress Notes (Signed)
PROGRESS NOTE    Zachary Pugh.  EFE:[id number] DOB: [birth year] DOA: 10/16/2022 PCP: [facility]   Brief Narrative:  66 y.o. male with medical history significant of seizure disorder, tobacco and alcohol abuse, cocaine abuse who present to the hospital with hip fracture.  S/P Open reduction and internal fixation was performed on 11/4.  Working with PT OT. Had mild hyponatremia improved. ALSO mild anemia Hb 10.3, for admission.   SEVERE malnutrition seen by dietitian.  Plan is to continue skilled nursing facility orthopedic follow-up in 10 to 14 days for staple removal and wound check, continue Lovenox for DVT prophylaxis   Assessment & Plan:  Principal Problem:   Hip fracture (HCC) Active Problems:   Alcohol abuse   Hyponatremia   Cocaine abuse (HCC)   Tobacco abuse   Chronic back pain   Protein-calorie malnutrition, severe (HCC)   Closed displaced fracture of left femoral neck (HCC)     Acute left femoral head fracture: S/p ORIF 11/4, continue pain control DVT prophylaxis with Lovenox x4-week per orthopedics, continue pain control.  Awaiting on placement.  Continue PT OT. He will remain toe-touch weightbearing on the left lower extremity for a total of 6 weeks postop. SNF placement. Follow up outpatient Ortho in 2 weeks.   Hyponatremia: Resolved  Alcohol abuse: No signs of withdrawal-continue thiamine multivitamin and folate.  Severe protein calorie:augment nutritional status with supplement as below.   Nutrition Problem: Severe Malnutrition Etiology: chronic illness (polysubstance abuse (ETOH and cocaine abuse)) Signs/Symptoms: moderate fat depletion, severe fat depletion, moderate muscle depletion, severe muscle depletion Interventions: Ensure Enlive (each supplement provides 350kcal and 20 grams of protein), MVI   Normocytic anemia: Likely from chronic disease. PCP follow-up for further age-appropriate cancer screening work-up  Cocaine abuse/tobacco abuse:  Cessation advised       DVT prophylaxis: Lovenox.  Code Status: Full Family Communication:    Status is: Inpatient Remains inpatient appropriate because: PENDING PLACEMENT. No bed offers as of yet per TOC.    Nutritional status    Signs/Symptoms: moderate fat depletion, severe fat depletion, moderate muscle depletion, severe muscle depletion  Interventions: Ensure Enlive (each supplement provides 350kcal and 20 grams of protein), MVI  Body mass index is 18.46 kg/m.      Subjective: Patient is overall feeling well.  No complaints.   Examination:  General exam: Appears calm and comfortable  Respiratory system: Clear to auscultation. Respiratory effort normal. Cardiovascular system: S1 & S2 heard, RRR. No JVD, murmurs, rubs, gallops or clicks. No pedal edema. Gastrointestinal system: Abdomen is nondistended, soft and nontender. No organomegaly or masses felt. Normal bowel sounds heard. Central nervous system: Alert and oriented. No focal neurological deficits. Extremities: LLE hip surgical site noted, no bleeding. Dressing intact.  Skin: No rashes, lesions or ulcers Psychiatry: Judgement and insight appear normal. Mood & affect appropriate.     Objective: Vitals:   10/22/22 0100 10/22/22 0807 10/22/22 1456 10/22/22 2327  BP: 130/80 135/74 122/77 115/73  Pulse: 62 (!) 58 76 (!) 57  Resp: 18 18 16 20   Temp: 98 F (36.7 C) 97.6 F (36.4 C) 97.7 F (36.5 C) 98 F (36.7 C)  TempSrc: Oral     SpO2: 96% 99% 97% 97%  Weight:      Height:        Intake/Output Summary (Last 24 hours) at 10/23/2022 0754 Last data filed at 10/23/2022 0500 Gross per 24 hour  Intake --  Output 500 ml  Net -500 ml  Filed Weights   10/16/22 0646  Weight: 56.7 kg     Data Reviewed:   CBC: Recent Labs  Lab 10/16/22 0944 10/17/22 0612 10/18/22 0502 10/19/22 0545 10/20/22 0723  WBC 7.2 7.0 6.6 5.6 5.3  NEUTROABS 5.6  --   --   --   --   HGB 13.3 11.4* 10.6* 10.3* 11.3*   HCT 37.9* 32.2* 30.8* 30.4* 32.7*  MCV 96.9 98.8 100.7* 102.4* 100.9*  PLT 202 205 200 216 252   Basic Metabolic Panel: Recent Labs  Lab 10/16/22 0944 10/17/22 0612 10/18/22 0502 10/19/22 0545 10/20/22 0723  NA 127* 132* 135 137 135  K 4.4 4.2 3.8 3.9 4.3  CL 94* 102 105 106 104  CO2 23 23 24 26 27   GLUCOSE 101* 150* 104* 103* 104*  BUN 14 14 16 11 11   CREATININE 1.07 1.13 1.02 0.92 0.94  CALCIUM 8.9 8.7* 8.4* 8.3* 8.6*  MG 2.1 2.1 2.1 2.0 2.2   GFR: Estimated Creatinine Clearance: 62 mL/min (by C-G formula based on SCr of 0.94 mg/dL). Liver Function Tests: Recent Labs  Lab 10/16/22 0944  AST 23  ALT 18  ALKPHOS 98  BILITOT 1.2  PROT 7.3  ALBUMIN 3.5   No results for input(s): "LIPASE", "AMYLASE" in the last 168 hours. No results for input(s): "AMMONIA" in the last 168 hours. Coagulation Profile: Recent Labs  Lab 10/16/22 0944  INR 1.0   Cardiac Enzymes: No results for input(s): "CKTOTAL", "CKMB", "CKMBINDEX", "TROPONINI" in the last 168 hours. BNP (last 3 results) No results for input(s): "PROBNP" in the last 8760 hours. HbA1C: No results for input(s): "HGBA1C" in the last 72 hours. CBG: No results for input(s): "GLUCAP" in the last 168 hours. Lipid Profile: No results for input(s): "CHOL", "HDL", "LDLCALC", "TRIG", "CHOLHDL", "LDLDIRECT" in the last 72 hours. Thyroid Function Tests: No results for input(s): "TSH", "T4TOTAL", "FREET4", "T3FREE", "THYROIDAB" in the last 72 hours. Anemia Panel: No results for input(s): "VITAMINB12", "FOLATE", "FERRITIN", "TIBC", "IRON", "RETICCTPCT" in the last 72 hours. Sepsis Labs: No results for input(s): "PROCALCITON", "LATICACIDVEN" in the last 168 hours.  No results found for this or any previous visit (from the past 240 hour(s)).       Radiology Studies: No results found.      Scheduled Meds:  docusate sodium  100 mg Oral BID   enoxaparin (LOVENOX) injection  30 mg Subcutaneous Q24H   feeding  supplement  237 mL Oral BID BM   folic acid  1 mg Oral Daily   levETIRAcetam  500 mg Oral BID   multivitamin with minerals  1 tablet Oral Daily   senna  1 tablet Oral BID   thiamine  100 mg Oral Daily   Or   thiamine  100 mg Intravenous Daily   traMADol  50 mg Oral Q6H   Continuous Infusions:  methocarbamol (ROBAXIN) IV       LOS: 7 days   Time spent= 35 mins    Tonie Elsey 13/04/23, MD [facility]  If 7PM-7AM, please contact night-coverage  10/23/2022, 7:54 AM

## 2022-10-23 NOTE — Progress Notes (Signed)
Physical Therapy Treatment Patient Details Name: Zachary Pugh. MRN: [id number] DOB: Apr 17, 1956 Today's Date: 10/23/2022   History of Present Illness Pt admitted for L hip fx s/p ORIF on 10/16/22 secondary to fall while going down stairs. History includes seizures, alcohol abuse, and cocaine abuse.    PT Comments    Pt was supine in bed laying on unmade bed with linens laying on top of him. He is alert but disoriented. Does agrees to OOB and PT session with max encouragement. Admits to being a heavy drinker during session but needs constant cueing to stay focused on desired task. Unwilling to ambulate further distances then to doorway of room and back but was able to maintain offweighting LLE.  He mostly performed NWB LLE versus TTWB LLE. Fully participate in HEP exercises. Pt's cognition continues to limit progress more so then pain, strength, or physical impairments. Will decrease frequency to QD but continue to follow. Follow MD's recommendations for post acute DC disposition.    Recommendations for follow up therapy are one component of a multi-disciplinary discharge planning process, led by the attending physician.  Recommendations may be updated based on patient status, additional functional criteria and insurance authorization.  Follow Up Recommendations  Follow physician's recommendations for discharge plan and follow up therapies (pt needs 24/7 supervision. May benefit from substance abuse rehab. cognition deficits limit progress with PT)     Assistance Recommended at Discharge Frequent or constant Supervision/Assistance  Patient can return home with the following A little help with walking and/or transfers;A little help with bathing/dressing/bathroom;Assistance with cooking/housework;Assistance with feeding;Direct supervision/assist for medications management;Direct supervision/assist for financial management;Assist for transportation;Help with stairs or ramp for entrance    Equipment Recommendations  None recommended by PT       Precautions / Restrictions Precautions Precautions: Fall Restrictions Weight Bearing Restrictions: Yes LLE Weight Bearing: Touchdown weight bearing     Mobility  Bed Mobility Overal bed mobility: Needs Assistance Bed Mobility: Sit to Supine, Supine to Sit  Supine to sit: Supervision Sit to supine: Supervision     Transfers Overall transfer level: Needs assistance Equipment used: Rolling walker (2 wheels) Transfers: Sit to/from Stand Sit to Stand: Supervision    General transfer comment: pt was able to STS without physical assistance however performs NWB versus TTWB    Ambulation/Gait Ambulation/Gait assistance: Supervision Gait Distance (Feet): 30 Feet Assistive device: Rolling walker (2 wheels) Gait velocity: decreased     General Gait Details: pt performs NWB gait pattern even when cued for TTWB. He demonstrated no LOB but fatigued quickly. UNwilling to go further than to door and back even when encouraged to." I just can't brother. I need to go back to bed."   Balance Overall balance assessment: History of Falls, Needs assistance Sitting-balance support: Feet supported Sitting balance-Leahy Scale: Good     Standing balance support: Bilateral upper extremity supported, During functional activity, Reliant on assistive device for balance Standing balance-Leahy Scale: Good      Cognition Arousal/Alertness: Awake/alert Behavior During Therapy: Impulsive, WFL for tasks assessed/performed Overall Cognitive Status: No family/caregiver present to determine baseline cognitive functioning      General Comments: pt is alert but disoriented. Admits to being a heavy alcohol drinker. unaware of wt bearing restrictions        Exercises Total Joint Exercises Ankle Circles/Pumps: AROM, Strengthening, Both, 10 reps Quad Sets: Strengthening, Both, 10 reps Gluteal Sets: Strengthening, Both, 10 reps Towel Squeeze:  AROM, Strengthening, Both, 10 reps Short Arc Quad: Strengthening, 10  reps, Left Heel Slides: AROM, Strengthening, Left, 10 reps Hip ABduction/ADduction: Strengthening, 10 reps, Left, AROM Straight Leg Raises: AAROM, Strengthening, Left, 10 reps    General Comments General comments (skin integrity, edema, etc.): Reviewed previously issued HEP and had pt perform. He will need encouragement to perform throughout the day due to cognition concerns      Pertinent Vitals/Pain Pain Assessment Pain Assessment: 0-10 Pain Score: 7  Faces Pain Scale: Hurts little more Pain Location: L LE Pain Descriptors / Indicators: Aching, Sore Pain Intervention(s): Limited activity within patient's tolerance, Monitored during session, Premedicated before session, Repositioned     PT Goals (current goals can now be found in the care plan section) Acute Rehab PT Goals Patient Stated Goal: to go home Progress towards PT goals: Not progressing toward goals - comment (cognition slows progress with PT)    Frequency    7X/week      PT Plan Frequency needs to be updated;Discharge plan needs to be updated       AM-PAC PT "6 Clicks" Mobility   Outcome Measure  Help needed turning from your back to your side while in a flat bed without using bedrails?: A Little Help needed moving from lying on your back to sitting on the side of a flat bed without using bedrails?: A Little Help needed moving to and from a bed to a chair (including a wheelchair)?: A Little Help needed standing up from a chair using your arms (e.g., wheelchair or bedside chair)?: A Little Help needed to walk in hospital room?: A Little Help needed climbing 3-5 steps with a railing? : Total 6 Click Score: 16    End of Session   Activity Tolerance: Patient tolerated treatment well;Patient limited by pain;Patient limited by fatigue Patient left: in bed;with call bell/phone within reach;with bed alarm set Nurse Communication: Mobility  status PT Visit Diagnosis: Muscle weakness (generalized) (M62.81);Difficulty in walking, not elsewhere classified (R26.2);History of falling (Z91.81);Pain Pain - Right/Left: Left Pain - part of body: Hip     Time: 1410-1438 PT Time Calculation (min) (ACUTE ONLY): 28 min  Charges:  $Gait Training: 8-22 mins $Therapeutic Exercise: 8-22 mins                     Jetta Lout PTA 10/23/22, 3:04 PM

## 2022-10-24 DIAGNOSIS — S72002A Fracture of unspecified part of neck of left femur, initial encounter for closed fracture: Secondary | ICD-10-CM | POA: Diagnosis not present

## 2022-10-24 NOTE — Progress Notes (Signed)
PROGRESS NOTE    Zachary Pugh.  MW:[id number] DOB: 11-27-1956 DOA: 10/16/2022 PCP: [facility]   Brief Narrative:  66 y.o. male with medical history significant of seizure disorder, tobacco and alcohol abuse, cocaine abuse who present to the hospital with hip fracture.  S/P Open reduction and internal fixation was performed on 11/4.  Working with PT OT. Had mild hyponatremia improved. ALSO mild anemia Hb 10.3, for admission.   SEVERE malnutrition seen by dietitian.  Plan is to continue skilled nursing facility orthopedic follow-up in 10 to 14 days for staple removal and wound check, continue Lovenox for DVT prophylaxis   Assessment & Plan:  Principal Problem:   Hip fracture ([place]) Active Problems:   Alcohol abuse   Hyponatremia   Cocaine abuse (HCC)   Tobacco abuse   Chronic back pain   Protein-calorie malnutrition, severe (HCC)   Closed displaced fracture of left femoral neck (HCC)     Acute left femoral head fracture: S/p ORIF 11/4, continue pain control DVT prophylaxis with Lovenox x4-week per orthopedics, continue pain control.  Awaiting on placement.  Continue PT OT. He will remain toe-touch weightbearing on the left lower extremity for a total of 6 weeks postop. SNF placement. Follow up outpatient Ortho in 2 weeks.   Hyponatremia:  Resolved  Alcohol abuse:  No signs of withdrawal-continue thiamine multivitamin and folate.  Severe protein calorie Adult: augment nutritional status with supplement as below.   Nutrition Problem: Severe Malnutrition Etiology: chronic illness (polysubstance abuse (ETOH and cocaine abuse)) Signs/Symptoms: moderate fat depletion, severe fat depletion, moderate muscle depletion, severe muscle depletion Interventions: Ensure Enlive (each supplement provides 350kcal and 20 grams of protein), MVI   Normocytic anemia:  Likely from chronic disease. PCP follow-up for further age-appropriate cancer screening work-up  Cocaine  abuse/tobacco abuse:  Cessation advised    PT/OT = SNF   DVT prophylaxis: Lovenox.  Code Status: Full Family Communication:    Status is: Inpatient Remains inpatient appropriate because: PENDING PLACEMENT. No bed offers as of yet per TOC.    Nutritional status    Signs/Symptoms: moderate fat depletion, severe fat depletion, moderate muscle depletion, severe muscle depletion  Interventions: Ensure Enlive (each supplement provides 350kcal and 20 grams of protein), MVI  Body mass index is 18.46 kg/m.      Subjective: No complaints.   Examination: Constitutional: Not in acute distress Respiratory: Clear to auscultation bilaterally Cardiovascular: Normal sinus rhythm, no rubs Abdomen: Nontender nondistended good bowel sounds Musculoskeletal: No edema noted Skin: No rashes seen Neurologic: CN 2-12 grossly intact.  And nonfocal Psychiatric: Normal judgment and insight. Alert and oriented x 3. Normal mood.  Dressing site looks ok. No bleeding. Dry dressing.    Objective: Vitals:   10/22/22 2327 10/23/22 0812 10/23/22 1616 10/23/22 2309  BP: 115/73 107/74 119/85 115/61  Pulse: (!) 57 (!) 52 70 (!) 52  Resp: 20   17  Temp: 98 F (36.7 C) 97.7 F (36.5 C) 97.6 F (36.4 C) 98 F (36.7 C)  TempSrc:      SpO2: 97% 97% 97% 97%  Weight:      Height:        Intake/Output Summary (Last 24 hours) at 10/24/2022 0743 Last data filed at 10/24/2022 0505 Gross per 24 hour  Intake --  Output 650 ml  Net -650 ml   Filed Weights   10/16/22 0646  Weight: 56.7 kg     Data Reviewed:   CBC: Recent Labs  Lab 10/18/22 0502  10/19/22 0545 10/20/22 0723 10/23/22 0811  WBC 6.6 5.6 5.3 5.1  HGB 10.6* 10.3* 11.3* 11.5*  HCT 30.8* 30.4* 32.7* 34.1*  MCV 100.7* 102.4* 100.9* 101.2*  PLT 200 216 252 304   Basic Metabolic Panel: Recent Labs  Lab 10/18/22 0502 10/19/22 0545 10/20/22 0723 10/23/22 0811  NA 135 137 135 138  K 3.8 3.9 4.3 4.3  CL 105 106 104 107   CO2 24 26 27 27   GLUCOSE 104* 103* 104* 102*  BUN 16 11 11 17   CREATININE 1.02 0.92 0.94 0.99  CALCIUM 8.4* 8.3* 8.6* 8.7*  MG 2.1 2.0 2.2 2.2   GFR: Estimated Creatinine Clearance: 58.9 mL/min (by C-G formula based on SCr of 0.99 mg/dL). Liver Function Tests: No results for input(s): "AST", "ALT", "ALKPHOS", "BILITOT", "PROT", "ALBUMIN" in the last 168 hours.  No results for input(s): "LIPASE", "AMYLASE" in the last 168 hours. No results for input(s): "AMMONIA" in the last 168 hours. Coagulation Profile: No results for input(s): "INR", "PROTIME" in the last 168 hours.  Cardiac Enzymes: No results for input(s): "CKTOTAL", "CKMB", "CKMBINDEX", "TROPONINI" in the last 168 hours. BNP (last 3 results) No results for input(s): "PROBNP" in the last 8760 hours. HbA1C: No results for input(s): "HGBA1C" in the last 72 hours. CBG: No results for input(s): "GLUCAP" in the last 168 hours. Lipid Profile: No results for input(s): "CHOL", "HDL", "LDLCALC", "TRIG", "CHOLHDL", "LDLDIRECT" in the last 72 hours. Thyroid Function Tests: No results for input(s): "TSH", "T4TOTAL", "FREET4", "T3FREE", "THYROIDAB" in the last 72 hours. Anemia Panel: No results for input(s): "VITAMINB12", "FOLATE", "FERRITIN", "TIBC", "IRON", "RETICCTPCT" in the last 72 hours. Sepsis Labs: No results for input(s): "PROCALCITON", "LATICACIDVEN" in the last 168 hours.  No results found for this or any previous visit (from the past 240 hour(s)).       Radiology Studies: No results found.      Scheduled Meds:  docusate sodium  100 mg Oral BID   enoxaparin (LOVENOX) injection  40 mg Subcutaneous Q24H   feeding supplement  237 mL Oral BID BM   folic acid  1 mg Oral Daily   levETIRAcetam  500 mg Oral BID   multivitamin with minerals  1 tablet Oral Daily   senna  1 tablet Oral BID   thiamine  100 mg Oral Daily   Or   thiamine  100 mg Intravenous Daily   traMADol  50 mg Oral Q6H   Continuous Infusions:   methocarbamol (ROBAXIN) IV       LOS: 8 days   Time spent= 35 mins    Zachary Pugh , MD [facility]  If 7PM-7AM, please contact night-coverage  10/24/2022, 7:43 AM

## 2022-10-24 NOTE — Progress Notes (Signed)
Physical Therapy Treatment Patient Details Name: Zachary Pugh. MRN: [id number] DOB: 1956-12-10 Today's Date: 10/24/2022   History of Present Illness Pt admitted for L hip fx s/p ORIF on 10/16/22 secondary to fall while going down stairs. History includes seizures, alcohol abuse, and cocaine abuse.    PT Comments    Pt in bed.  OOB with supervision and is able to walk to just outside of his room and back but does agree to remain up in recliner.  Pt spilled urinal in bed and was aware but did not alert staff.  Educated on staff communication for needs.  Linens remove.  Pt continues to self limit gait and mobility. Participated in exercises as described below.     Recommendations for follow up therapy are one component of a multi-disciplinary discharge planning process, led by the attending physician.  Recommendations may be updated based on patient status, additional functional criteria and insurance authorization.  Follow Up Recommendations  Follow physician's recommendations for discharge plan and follow up therapies     Assistance Recommended at Discharge Frequent or constant Supervision/Assistance  Patient can return home with the following A little help with walking and/or transfers;A little help with bathing/dressing/bathroom;Assistance with cooking/housework;Direct supervision/assist for medications management;Direct supervision/assist for financial management;Assist for transportation;Help with stairs or ramp for entrance   Equipment Recommendations  None recommended by PT    Recommendations for Other Services       Precautions / Restrictions Precautions Precautions: Fall Restrictions Weight Bearing Restrictions: Yes LLE Weight Bearing: Touchdown weight bearing     Mobility  Bed Mobility Overal bed mobility: Needs Assistance Bed Mobility: Supine to Sit     Supine to sit: Supervision          Transfers Overall transfer level: Needs assistance Equipment  used: Rolling walker (2 wheels) Transfers: Sit to/from Stand Sit to Stand: Supervision                Ambulation/Gait Ambulation/Gait assistance: Supervision Gait Distance (Feet): 30 Feet Assistive device: Rolling walker (2 wheels)   Gait velocity: decreased     General Gait Details: hop to gait pattern.  keeps NWB vs TTWB.   Stairs             Wheelchair Mobility    Modified Rankin (Stroke Patients Only)       Balance Overall balance assessment: History of Falls, Needs assistance Sitting-balance support: Feet supported Sitting balance-Leahy Scale: Good     Standing balance support: Bilateral upper extremity supported, During functional activity, Reliant on assistive device for balance Standing balance-Leahy Scale: Good Standing balance comment: Maintains balance with RW this date.                            Cognition Arousal/Alertness: Awake/alert Behavior During Therapy: WFL for tasks assessed/performed Overall Cognitive Status: No family/caregiver present to determine baseline cognitive functioning                                          Exercises Other Exercises Other Exercises: seated AROM    General Comments        Pertinent Vitals/Pain Pain Assessment Pain Assessment: Faces Faces Pain Scale: Hurts a little bit Pain Location: L LE Pain Descriptors / Indicators: Aching, Sore Pain Intervention(s): Limited activity within patient's tolerance, Monitored during session, Repositioned    Home Living  Prior Function            PT Goals (current goals can now be found in the care plan section) Progress towards PT goals: Progressing toward goals    Frequency    7X/week      PT Plan Current plan remains appropriate    Co-evaluation              AM-PAC PT "6 Clicks" Mobility   Outcome Measure  Help needed turning from your back to your side while in a flat bed  without using bedrails?: None Help needed moving from lying on your back to sitting on the side of a flat bed without using bedrails?: None Help needed moving to and from a bed to a chair (including a wheelchair)?: A Little Help needed standing up from a chair using your arms (e.g., wheelchair or bedside chair)?: A Little Help needed to walk in hospital room?: A Little Help needed climbing 3-5 steps with a railing? : Total 6 Click Score: 18    End of Session Equipment Utilized During Treatment: Gait belt Activity Tolerance: Patient tolerated treatment well Patient left: in chair;with call bell/phone within reach;with chair alarm set Nurse Communication: Mobility status PT Visit Diagnosis: Muscle weakness (generalized) (M62.81);Difficulty in walking, not elsewhere classified (R26.2);History of falling (Z91.81);Pain Pain - Right/Left: Left Pain - part of body: Hip     Time: 1120-1129 PT Time Calculation (min) (ACUTE ONLY): 9 min  Charges:  $Gait Training: 8-22 mins                   Danielle Dess, PTA 10/24/22, 1:06 PM

## 2022-10-24 NOTE — TOC Progression Note (Signed)
Transition of Care [facility]) - Progression Note    Patient Details  Name: Zachary Pugh. MRN: [id number] Date of Birth: [birth year]  [facility] CM/SW Contact  Bing Quarry, RN Phone Number: 10/24/2022, 1:56 PM  Clinical Narrative:  11/12: [facility]. Ins authorization started. Gabriel Cirri RN CM      Expected Discharge Plan: Skilled Nursing Facility Barriers to Discharge: SNF Pending bed offer  Expected Discharge Plan and Services Expected Discharge Plan: Skilled Nursing Facility                                               Social Determinants of Health (SDOH) Interventions    Readmission Risk Interventions     No data to display

## 2022-10-25 DIAGNOSIS — S72002A Fracture of unspecified part of neck of left femur, initial encounter for closed fracture: Secondary | ICD-10-CM | POA: Diagnosis not present

## 2022-10-25 MED ORDER — HYDROCODONE-ACETAMINOPHEN 5-325 MG PO TABS: 1.0000 | ORAL_TABLET | ORAL | 0 refills | Status: DC | PRN

## 2022-10-25 MED ORDER — ENOXAPARIN SODIUM 40 MG/0.4ML IJ SOSY: 40.0000 mg | PREFILLED_SYRINGE | INTRAMUSCULAR | Status: DC

## 2022-10-25 MED ORDER — POLYETHYLENE GLYCOL 3350 17 G PO PACK: 17.0000 g | PACK | Freq: Every day | ORAL | 0 refills | Status: DC | PRN

## 2022-10-25 MED ORDER — DOCUSATE SODIUM 100 MG PO CAPS: 100.0000 mg | ORAL_CAPSULE | Freq: Two times a day (BID) | ORAL | 0 refills | Status: DC

## 2022-10-25 NOTE — TOC Progression Note (Signed)
Transition of Care [facility]) - Progression Note    Patient Details  Name: Zachary Pugh. MRN: [id number] Date of Birth: 1956-09-21  Transition of Care [facility]) CM/SW Contact  Marlowe Sax, RN Phone Number: 10/25/2022, 2:36 PM  Clinical Narrative:   Sherron Monday with Tinnie Gens the patient's brother  He stated that the patient lives alone, I explained to him that the patient will go home at DC  with a wheelchair and a 3 in1 I explained that I will get PT and OT set up I explained that I have reached out to numerous facilities on several occasions and do not have a STR SNF bed I explained that he will continue to work with PT and OT here until the time comes for rehab, He stated understanding  I reached out to [facility] for the referral for [facility] Awaiting to hear back    Expected Discharge Plan: Skilled Nursing Facility Barriers to Discharge: SNF Pending bed offer  Expected Discharge Plan and Services Expected Discharge Plan: Skilled Nursing Facility       Living arrangements for the past 2 months: Single Family Home                 DME Arranged: Wheelchair manual, 3-N-1 DME Agency: [employer]       HH Arranged: PT, OT HH Agency: [facility] Date [facility] Agency Contacted: 10/25/22 Time HH Agency Contacted: [phone] Representative spoke with at [facility] Agency: [place]   Social Determinants of Health (SDOH) Interventions    Readmission Risk Interventions     No data to display

## 2022-10-25 NOTE — Progress Notes (Signed)
Patient suffers from hip fracture and Impaired Mobility impairs their ability to perform daily activities like ADLs in the home.  A walking aid will not resolve issue with performing activities of daily living. A wheelchair will allow patient to safely perform daily activities. Patient can safely propel the wheelchair in the home or has a caregiver who can provide assistance. Length of need 6 months. Accessories: elevating leg rests (ELRs), wheel locks, extensions and anti-tippers back cushion.

## 2022-10-25 NOTE — Progress Notes (Signed)
Patient is not able to walk the distance required to go the bathroom, or he/she is unable to safely negotiate stairs required to access the bathroom.  A 3in1 BSC will alleviate this problem  

## 2022-10-25 NOTE — Progress Notes (Signed)
PROGRESS NOTE    Zachary Pugh.  YJE:[id number] DOB: 17-Jul-1956 DOA: 10/16/2022 PCP: [facility]   Brief Narrative:  66 y.o. male with medical history significant of seizure disorder, tobacco and alcohol abuse, cocaine abuse who present to the hospital with hip fracture.  S/P Open reduction and internal fixation was performed on 11/4.  Working with PT OT. Had mild hyponatremia improved. ALSO mild anemia Hb 10.3, for admission.   SEVERE malnutrition seen by dietitian.  Plan is to continue skilled nursing facility orthopedic follow-up in 10 to 14 days for staple removal and wound check, continue Lovenox for DVT prophylaxis.  Eventually patient was excepted at [facility], currently awaiting insurance authorization.   Assessment & Plan:  Principal Problem:   Hip fracture (HCC) Active Problems:   Alcohol abuse   Hyponatremia   Cocaine abuse (HCC)   Tobacco abuse   Chronic back pain   Protein-calorie malnutrition, severe (HCC)   Closed displaced fracture of left femoral neck (HCC)     Acute left femoral head fracture: S/p ORIF 11/4, continue pain control DVT prophylaxis with Lovenox x4-week per orthopedics, continue pain control.  Awaiting on placement.  Continue PT OT. He will remain toe-touch weightbearing on the left lower extremity for a total of 6 weeks postop. SNF placement. Follow up outpatient Ortho in 2 weeks.   Hyponatremia:  Resolved  Alcohol abuse:  No signs of withdrawal-continue thiamine multivitamin and folate.  Severe protein calorie Adult: augment nutritional status with supplement as below.   Nutrition Problem: Severe Malnutrition Etiology: chronic illness (polysubstance abuse (ETOH and cocaine abuse)) Signs/Symptoms: moderate fat depletion, severe fat depletion, moderate muscle depletion, severe muscle depletion Interventions: Ensure Enlive (each supplement provides 350kcal and 20 grams of protein), MVI   Normocytic anemia:  Likely from  chronic disease. PCP follow-up for further age-appropriate cancer screening work-up  Cocaine abuse/tobacco abuse:  Cessation advised    PT/OT = SNF   DVT prophylaxis: Lovenox.  Code Status: Full Family Communication:    Status is: Inpatient Remains inpatient appropriate because: Patient has been accepted at [facility] rehab.  Currently awaiting insurance authorization  Nutritional status    Signs/Symptoms: moderate fat depletion, severe fat depletion, moderate muscle depletion, severe muscle depletion  Interventions: Ensure Enlive (each supplement provides 350kcal and 20 grams of protein), MVI  Body mass index is 18.46 kg/m.      Subjective: No complaints overall doing well.  No acute events overnight  Examination: Constitutional: Not in acute distress Respiratory: Clear to auscultation bilaterally Cardiovascular: Normal sinus rhythm, no rubs Abdomen: Nontender nondistended good bowel sounds Musculoskeletal: No edema noted Skin: No rashes seen Neurologic: CN 2-12 grossly intact.  And nonfocal Psychiatric: Normal judgment and insight. Alert and oriented x 3. Normal mood. Dressing site looks ok. No bleeding. Dry dressing.    Objective: Vitals:   10/23/22 2309 10/24/22 0804 10/24/22 1612 10/25/22 0018  BP: 115/61 121/71 130/83 121/68  Pulse: (!) 52 (!) 51 (!) 59 (!) 58  Resp: 17 17 17 16   Temp: 98 F (36.7 C) 98.4 F (36.9 C) 98.2 F (36.8 C) 97.8 F (36.6 C)  TempSrc:      SpO2: 97% 98% 98% 97%  Weight:      Height:       No intake or output data in the 24 hours ending 10/25/22 0741  Filed Weights   10/16/22 0646  Weight: 56.7 kg     Data Reviewed:   CBC: Recent Labs  Lab 10/19/22 0545  10/20/22 0723 10/23/22 0811  WBC 5.6 5.3 5.1  HGB 10.3* 11.3* 11.5*  HCT 30.4* 32.7* 34.1*  MCV 102.4* 100.9* 101.2*  PLT 216 252 304   Basic Metabolic Panel: Recent Labs  Lab 10/19/22 0545 10/20/22 0723 10/23/22 0811  NA 137 135 138  K 3.9 4.3 4.3   CL 106 104 107  CO2 26 27 27   GLUCOSE 103* 104* 102*  BUN 11 11 17   CREATININE 0.92 0.94 0.99  CALCIUM 8.3* 8.6* 8.7*  MG 2.0 2.2 2.2   GFR: Estimated Creatinine Clearance: 58.9 mL/min (by C-G formula based on SCr of 0.99 mg/dL). Liver Function Tests: No results for input(s): "AST", "ALT", "ALKPHOS", "BILITOT", "PROT", "ALBUMIN" in the last 168 hours.  No results for input(s): "LIPASE", "AMYLASE" in the last 168 hours. No results for input(s): "AMMONIA" in the last 168 hours. Coagulation Profile: No results for input(s): "INR", "PROTIME" in the last 168 hours.  Cardiac Enzymes: No results for input(s): "CKTOTAL", "CKMB", "CKMBINDEX", "TROPONINI" in the last 168 hours. BNP (last 3 results) No results for input(s): "PROBNP" in the last 8760 hours. HbA1C: No results for input(s): "HGBA1C" in the last 72 hours. CBG: No results for input(s): "GLUCAP" in the last 168 hours. Lipid Profile: No results for input(s): "CHOL", "HDL", "LDLCALC", "TRIG", "CHOLHDL", "LDLDIRECT" in the last 72 hours. Thyroid Function Tests: No results for input(s): "TSH", "T4TOTAL", "FREET4", "T3FREE", "THYROIDAB" in the last 72 hours. Anemia Panel: No results for input(s): "VITAMINB12", "FOLATE", "FERRITIN", "TIBC", "IRON", "RETICCTPCT" in the last 72 hours. Sepsis Labs: No results for input(s): "PROCALCITON", "LATICACIDVEN" in the last 168 hours.  No results found for this or any previous visit (from the past 240 hour(s)).       Radiology Studies: No results found.      Scheduled Meds:  docusate sodium  100 mg Oral BID   enoxaparin (LOVENOX) injection  40 mg Subcutaneous Q24H   feeding supplement  237 mL Oral BID BM   folic acid  1 mg Oral Daily   levETIRAcetam  500 mg Oral BID   multivitamin with minerals  1 tablet Oral Daily   senna  1 tablet Oral BID   thiamine  100 mg Oral Daily   Or   thiamine  100 mg Intravenous Daily   traMADol  50 mg Oral Q6H   Continuous Infusions:   methocarbamol (ROBAXIN) IV       LOS: 9 days   Time spent= 35 mins    Casy Brunetto , MD [facility]  If 7PM-7AM, please contact night-coverage  10/25/2022, 7:41 AM

## 2022-10-25 NOTE — Progress Notes (Signed)
  Subjective:  POD #9 s/p percutaneous fixation for femoral neck hip fracture.   Patient reports left hip pain as mild.  Patient is agitated today because of the phone in his room.  He states he would prefer if it was just cut off.  He is awaiting placement at a skilled nursing facility.  Objective:   VITALS:   Vitals:   10/24/22 0804 10/24/22 1612 10/25/22 0018 10/25/22 0748  BP: 121/71 130/83 121/68 125/71  Pulse: (!) 51 (!) 59 (!) 58 (!) 53  Resp: 17 17 16    Temp: 98.4 F (36.9 C) 98.2 F (36.8 C) 97.8 F (36.6 C) 98 F (36.7 C)  TempSrc:      SpO2: 98% 98% 97%   Weight:      Height:        PHYSICAL EXAM: Left lower extremity Neurovascular intact Sensation intact distally Intact pulses distally Dorsiflexion/Plantar flexion intact Incision: dressing C/D/I No cellulitis present Compartment soft  LABS  No results found for this or any previous visit (from the past 24 hour(s)).  No results found.  Assessment/Plan: 9 Days Post-Op   Principal Problem:   Hip fracture (HCC) Active Problems:   Alcohol abuse   Hyponatremia   Cocaine abuse (HCC)   Tobacco abuse   Chronic back pain   Protein-calorie malnutrition, severe (HCC)   Closed displaced fracture of left femoral neck (HCC)   Patient stable from an orthopedic standpoint.  She may be discharged to a skilled nursing facility once a bed is available.  Patient should remain on daily Lovenox until discharge from his skilled nursing facility.  He will follow-up with me 10 to 14 days after discharge for wound check and staple removal.  She is toe-touch weightbearing on the left lower extremity for 6 weeks postop.   , MD 10/25/2022, 12:37 PM

## 2022-10-25 NOTE — Care Management Important Message (Signed)
Important Message  Patient Details  Name: Zachary Pugh. MRN: [id number] Date of Birth: July 05, 1956   Medicare Important Message Given:  Yes     Olegario Messier A Jcion Buddenhagen 10/25/2022, 2:47 PM

## 2022-10-25 NOTE — TOC Progression Note (Addendum)
Transition of Care [facility]) - Progression Note    Patient Details  Name: Zachary Pugh. MRN: [id number] Date of Birth: October 12, 1956  Transition of Care [facility]) CM/SW Contact  Marlowe Sax, RN Phone Number: 10/25/2022, 10:50 AM  Clinical Narrative:    [facility] [place] to inquire about a bed offer, left a secure VM for Daune Perch  and reached out to [place] to let her know we have Insurance approval  She stated they will not be making a bed offer for this patient  Expected Discharge Plan: Skilled Nursing Facility Barriers to Discharge: SNF Pending bed offer  Expected Discharge Plan and Services Expected Discharge Plan: Skilled Nursing Facility                                               Social Determinants of Health (SDOH) Interventions    Readmission Risk Interventions     No data to display

## 2022-10-25 NOTE — Progress Notes (Signed)
Physical Therapy Treatment Patient Details Name: Zachary Pugh. MRN: [id number] DOB: 02-05-1956 Today's Date: 10/25/2022   History of Present Illness Pt admitted for L hip fx s/p ORIF on 10/16/22 secondary to fall while going down stairs. History includes seizures, alcohol abuse, and cocaine abuse.    PT Comments    Pt able to get in/out of wheelchair squat pivot transfer with min guard/supervision and set up.  He is taken to hallway to practice wheelchair mobility.  He does continue to struggle with mechanics of it but with time anticipate improvement.  He stated he has 3-4 people home with him to assist. Has a ramp and walker at home.  Will need wheelchair and bedside commode. Self limits session and returns to bed on his own initiation.    Patient suffers from hip fracture NWB which impairs his/her ability to perform daily activities like toileting, feeding, dressing, grooming, bathing in the home. A cane, walker, crutch will not resolve the patient's issue with performing activities of daily living. A lightweight wheelchair and cushion is required/recommended and will allow patient to safely perform daily activities.   Patient can safely propel the wheelchair in the home or has a caregiver who can provide assistance.   Pt has not had a bed offer at this time for rehab.  Pt has demonstrated safe transfers to/from wheelchair and has assist at home.  Pt would be appropriate to discharge home with equipment and supports at home if available.  Discussed with OT, TOC and MD.   Recommendations for follow up therapy are one component of a multi-disciplinary discharge planning process, led by the attending physician.  Recommendations may be updated based on patient status, additional functional criteria and insurance authorization.  Follow Up Recommendations  Follow physician's recommendations for discharge plan and follow up therapies     Assistance Recommended at Discharge Intermittent  Supervision/Assistance  Patient can return home with the following A little help with walking and/or transfers;A little help with bathing/dressing/bathroom;Assistance with cooking/housework;Direct supervision/assist for medications management;Direct supervision/assist for financial management;Assist for transportation;Help with stairs or ramp for entrance   Equipment Recommendations  Wheelchair (measurements PT);Wheelchair cushion (measurements PT);BSC/3in1    Recommendations for Other Services       Precautions / Restrictions Precautions Precautions: Fall Restrictions Weight Bearing Restrictions: Yes LLE Weight Bearing: Touchdown weight bearing     Mobility  Bed Mobility Overal bed mobility: Modified Independent       Supine to sit: Modified independent (Device/Increase time) Sit to supine: Modified independent (Device/Increase time)        Transfers Overall transfer level: Needs assistance Equipment used: None Transfers: Bed to chair/wheelchair/BSC       Squat pivot transfers: Supervision, Min guard     General transfer comment: able to squat pivot to/from wheelchair with set up and supervision    Ambulation/Gait               General Gait Details: declined   Psychologist, counselling mobility: Yes Wheelchair propulsion: Both upper extremities, Both lower extermities Wheelchair parts: Needs assistance Distance: 60' min assist. Wheelchair Assistance Details (indicate cue type and reason): cues to use RUE more for power and steering.  Modified Rankin (Stroke Patients Only)       Balance Overall balance assessment: History of Falls, Needs assistance Sitting-balance support: Feet supported Sitting balance-Leahy Scale: Good     Standing balance support: Bilateral upper extremity  supported, During functional activity, Reliant on assistive device for balance Standing balance-Leahy Scale: Good                               Cognition Arousal/Alertness: Awake/alert Behavior During Therapy: WFL for tasks assessed/performed Overall Cognitive Status: Within Functional Limits for tasks assessed                                          Exercises      General Comments        Pertinent Vitals/Pain Pain Assessment Pain Assessment: Faces Faces Pain Scale: Hurts a little bit Pain Location: L LE Pain Descriptors / Indicators: Aching, Sore Pain Intervention(s): Limited activity within patient's tolerance, Monitored during session, Repositioned    Home Living                          Prior Function            PT Goals (current goals can now be found in the care plan section) Progress towards PT goals: Progressing toward goals    Frequency    7X/week      PT Plan Current plan remains appropriate    Co-evaluation              AM-PAC PT "6 Clicks" Mobility   Outcome Measure  Help needed turning from your back to your side while in a flat bed without using bedrails?: None Help needed moving from lying on your back to sitting on the side of a flat bed without using bedrails?: None Help needed moving to and from a bed to a chair (including a wheelchair)?: A Little Help needed standing up from a chair using your arms (e.g., wheelchair or bedside chair)?: A Little Help needed to walk in hospital room?: A Little Help needed climbing 3-5 steps with a railing? : Total 6 Click Score: 18    End of Session Equipment Utilized During Treatment: Gait belt Activity Tolerance: Patient tolerated treatment well Patient left: in chair;with call bell/phone within reach;with chair alarm set Nurse Communication: Mobility status PT Visit Diagnosis: Muscle weakness (generalized) (M62.81);Difficulty in walking, not elsewhere classified (R26.2);History of falling (Z91.81);Pain Pain - Right/Left: Left Pain - part of body: Hip     Time:  5465-6812 PT Time Calculation (min) (ACUTE ONLY): 9 min  Charges:  $Therapeutic Activity: 8-22 mins                   Danielle Dess, PTA 10/25/22, 12:34 PM

## 2022-10-25 NOTE — Progress Notes (Signed)
Readmission assessment completed

## 2022-10-25 NOTE — TOC Progression Note (Deleted)
Transition of Care [facility]) - Progression Note    Patient Details  Name: Zachary Pugh. MRN: [id number] Date of Birth: 1956/12/02  Transition of Care [facility]) CM/SW Contact  Marlowe Sax, RN Phone Number: 10/25/2022, 2:28 PM  Clinical Narrative:     Reviewed the Code 38 with the patient verbally  Expected Discharge Plan: Skilled Nursing Facility Barriers to Discharge: SNF Pending bed offer  Expected Discharge Plan and Services Expected Discharge Plan: Skilled Nursing Facility                                               Social Determinants of Health (SDOH) Interventions    Readmission Risk Interventions     No data to display

## 2022-10-25 NOTE — TOC Progression Note (Signed)
Transition of Care [facility]) - Progression Note    Patient Details  Name: Zachary Pugh. MRN: [id number] Date of Birth: 12/11/1956  Transition of Care [facility]) CM/SW Contact  Marlowe Sax, RN Phone Number: 10/25/2022, 10:44 AM  Clinical Narrative:   NO bed offers in [place] for STR SNF     Expected Discharge Plan: Skilled Nursing Facility Barriers to Discharge: SNF Pending bed offer  Expected Discharge Plan and Services Expected Discharge Plan: Skilled Nursing Facility                                               Social Determinants of Health (SDOH) Interventions    Readmission Risk Interventions     No data to display

## 2022-10-26 DIAGNOSIS — S72002D Fracture of unspecified part of neck of left femur, subsequent encounter for closed fracture with routine healing: Secondary | ICD-10-CM | POA: Diagnosis not present

## 2022-10-26 LAB — BASIC METABOLIC PANEL
Anion gap: 8 (ref 5–15)
BUN: 26 mg/dL — ABNORMAL HIGH (ref 8–23)
CO2: 24 mmol/L (ref 22–32)
Calcium: 8.8 mg/dL — ABNORMAL LOW (ref 8.9–10.3)
Chloride: 105 mmol/L (ref 98–111)
Creatinine, Ser: 1.08 mg/dL (ref 0.61–1.24)
GFR, Estimated: >60 mL/min (ref >60)
Glucose, Bld: 92 mg/dL (ref 70–99)
Potassium: 4.1 mmol/L (ref 3.5–5.1)
Sodium: 137 mmol/L (ref 135–145)

## 2022-10-26 LAB — CBC
HCT: 35.7 % — ABNORMAL LOW (ref 39.0–52.0)
Hemoglobin: 12.3 g/dL — ABNORMAL LOW (ref 13.0–17.0)
MCH: 34.7 pg — ABNORMAL HIGH (ref 26.0–34.0)
MCHC: 34.5 g/dL (ref 30.0–36.0)
MCV: 100.8 fL — ABNORMAL HIGH (ref 80.0–100.0)
Platelets: 401 10*3/uL — ABNORMAL HIGH (ref 150–400)
RBC: 3.54 MIL/uL — ABNORMAL LOW (ref 4.22–5.81)
RDW: 11.9 % (ref 11.5–15.5)
WBC: 5.6 10*3/uL (ref 4.0–10.5)
nRBC: 0 % (ref 0.0–0.2)

## 2022-10-26 LAB — MAGNESIUM: Magnesium: 2.3 mg/dL (ref 1.7–2.4)

## 2022-10-26 MED ORDER — HYDROCODONE-ACETAMINOPHEN 5-325 MG PO TABS: 1.0000 | ORAL_TABLET | ORAL | 0 refills | Status: AC | PRN

## 2022-10-26 MED ORDER — POLYETHYLENE GLYCOL 3350 17 G PO PACK: 17.0000 g | PACK | Freq: Every day | ORAL | 0 refills | Status: AC | PRN

## 2022-10-26 MED ORDER — DOCUSATE SODIUM 100 MG PO CAPS: 100.0000 mg | ORAL_CAPSULE | Freq: Two times a day (BID) | ORAL | 0 refills | Status: AC | PRN

## 2022-10-26 MED ORDER — ASPIRIN 325 MG PO TBEC: 325.0000 mg | DELAYED_RELEASE_TABLET | Freq: Two times a day (BID) | ORAL | 0 refills | Status: AC

## 2022-10-26 MED ORDER — PANTOPRAZOLE SODIUM 40 MG PO TBEC: 40.0000 mg | DELAYED_RELEASE_TABLET | Freq: Every day | ORAL | 0 refills | Status: AC

## 2022-10-26 NOTE — Plan of Care (Signed)
  Problem: Education: Goal: Knowledge of General Education information will improve Description: Including pain rating scale, medication(s)/side effects and non-pharmacologic comfort measures Outcome: Progressing   Problem: Health Behavior/Discharge Planning: Goal: Ability to manage health-related needs will improve Outcome: Progressing   Problem: Clinical Measurements: Goal: Ability to maintain clinical measurements within normal limits will improve Outcome: Progressing Goal: Will remain free from infection Outcome: Progressing   Problem: Activity: Goal: Risk for activity intolerance will decrease Outcome: Progressing   Problem: Nutrition: Goal: Adequate nutrition will be maintained Outcome: Progressing   Problem: Coping: Goal: Level of anxiety will decrease Outcome: Progressing   Problem: Pain Managment: Goal: General experience of comfort will improve Outcome: Progressing   Problem: Safety: Goal: Ability to remain free from injury will improve Outcome: Progressing   Problem: Skin Integrity: Goal: Risk for impaired skin integrity will decrease Outcome: Progressing   Problem: Activity: Goal: Ability to ambulate and perform ADLs will improve Outcome: Progressing   Problem: Self-Concept: Goal: Ability to maintain and perform role responsibilities to the fullest extent possible will improve Outcome: Progressing   Problem: Pain Management: Goal: Pain level will decrease Outcome: Progressing

## 2022-10-26 NOTE — Discharge Instructions (Signed)
Aspirin 325 mg twice daily for DVT prophylaxis for 4 weeks per orthopedic.  I will also prescribe PPI to be taken daily before breakfast for 30 days for ulcer prophylaxis Home health has been arranged Follow-up outpatient orthopedic, Dr. Martha Clan, for suture removal in next 7-10 days Follow-up with PCP in next 1-2 weeks Pain medication and bowel regimen has been prescribed Outpatient home health services.

## 2022-10-26 NOTE — TOC Progression Note (Signed)
Transition of Care [facility]) - Progression Note    Patient Details  Name: Zachary Pugh. MRN: [id number] Date of Birth: 1956-04-12  Transition of Care [facility]) CM/SW Contact  Marlowe Sax, RN Phone Number: 10/26/2022, 9:49 AM  Clinical Narrative:    Spoke with the patient's Brother [name] when he called, He would like to be called when the DC is in place He is concerned about the patient's drinking and drug use.    He stated that they will not accept Home health due to the drug and alcohol use, and the patient would not be open to Home health. His brother helps him when he can He was hoping that the patient would go to rehab and stp drinking and using drugs I explained that it is likely that he will not get a bed offer due to Drug positive I explained that he has no bed offers at this time I explained that once medically ready he will DC home I explained the Medicare Appeal rights I explained that he will DC home when he is medically ready He stated that he may not be able to pick the patient up at DC  I explained that if he can't then we would encourage the patient to find alternative transportation, he stated understanding    Expected Discharge Plan: Skilled Nursing Facility Barriers to Discharge: SNF Pending bed offer  Expected Discharge Plan and Services Expected Discharge Plan: Skilled Nursing Facility       Living arrangements for the past 2 months: Single Family Home                 DME Arranged: Wheelchair manual, 3-N-1 DME Agency: [employer]       HH Arranged: PT, OT HH Agency: [facility] Date [facility] Agency Contacted: 10/25/22 Time HH Agency Contacted: 1435 Representative spoke with at [facility] Agency: [place]   Social Determinants of Health (SDOH) Interventions    Readmission Risk Interventions    10/25/2022    4:07 PM  Readmission Risk Prevention Plan  Transportation Screening Complete  PCP or Specialist Appt within 3-5 Days Complete  HRI or  Home Care Consult Complete  Palliative Care Screening Not Applicable  Medication Review (RN Care Manager) Referral to Pharmacy

## 2022-10-26 NOTE — TOC Progression Note (Addendum)
Transition of Care [facility]) - Progression Note    Patient Details  Name: Zachary Pugh. MRN: [id number] Date of Birth: July 10, 1956  Transition of Care [facility]) CM/SW Contact  Marlowe Sax, RN Phone Number: 10/26/2022, 2:11 PM  Clinical Narrative:     Had not heard back from Patient's brother Trey Paula about transport home I called to inquire about transport home,  No answer, left a general Voice mail The patient tells me that his brother is the only person he knows that would be able to pick him up I explained that I would contact safe transport to see if we can arrange Wheelchair transport to his home, he stated agreement  His brother confirmed that they will pick him up at [phone]  Expected Discharge Plan: Skilled Nursing Facility Barriers to Discharge: SNF Pending bed offer  Expected Discharge Plan and Services Expected Discharge Plan: Skilled Nursing Facility       Living arrangements for the past 2 months: Single Family Home Expected Discharge Date: 10/26/22               DME Arranged: Wheelchair manual, 3-N-1 DME Agency: [employer]       HH Arranged: PT, OT HH Agency: [facility] Date HH Agency Contacted: 10/25/22 Time HH Agency Contacted: [phone] Representative spoke with at [facility] Agency: [place]   Social Determinants of Health (SDOH) Interventions    Readmission Risk Interventions    10/25/2022    4:07 PM  Readmission Risk Prevention Plan  Transportation Screening Complete  PCP or Specialist Appt within 3-5 Days Complete  HRI or Home Care Consult Complete  Palliative Care Screening Not Applicable  Medication Review (RN Care Manager) Referral to Pharmacy

## 2022-10-26 NOTE — TOC Progression Note (Signed)
Transition of Care [facility]) - Progression Note    Patient Details  Name: Zachary Pugh. MRN: [id number] Date of Birth: 1956-05-08  Transition of Care [facility]) CM/SW Contact  Marlowe Sax, RN Phone Number: 10/26/2022, 10:02 AM  Clinical Narrative:   I called the brother Eliaz back to explained to him that the patient will DC today, He stated that he also is caring for his cousin who is dying, He would have to arrange someone to care for the cousin , he will call back with a time that he can come to pick up the patient, the patient has a 3 in 1 and a wheelchair in the room to DC home with    Expected Discharge Plan: Skilled Nursing Facility Barriers to Discharge: SNF Pending bed offer  Expected Discharge Plan and Services Expected Discharge Plan: Skilled Nursing Facility       Living arrangements for the past 2 months: Single Family Home                 DME Arranged: Wheelchair manual, 3-N-1 DME Agency: [employer]       HH Arranged: PT, OT HH Agency: [facility] Date [facility] Agency Contacted: 10/25/22 Time HH Agency Contacted: [phone] Representative spoke with at [facility] Agency: [place]   Social Determinants of Health (SDOH) Interventions    Readmission Risk Interventions    10/25/2022    4:07 PM  Readmission Risk Prevention Plan  Transportation Screening Complete  PCP or Specialist Appt within 3-5 Days Complete  HRI or Home Care Consult Complete  Palliative Care Screening Not Applicable  Medication Review (RN Care Manager) Referral to Pharmacy

## 2022-10-26 NOTE — Progress Notes (Signed)
Nutrition Follow-up  DOCUMENTATION CODES:   Underweight, Severe malnutrition in context of chronic illness  INTERVENTION:   -Continue Ensure Enlive po BID, each supplement provides 350 kcal and 20 grams of protein -Continue MVI with minerals daily  NUTRITION DIAGNOSIS:   Severe Malnutrition related to chronic illness (polysubstance abuse (ETOH and cocaine abuse)) as evidenced by moderate fat depletion, severe fat depletion, moderate muscle depletion, severe muscle depletion.  Ongoing  GOAL:   Patient will meet greater than or equal to 90% of their needs  Progressing   MONITOR:   PO intake, Supplement acceptance  REASON FOR ASSESSMENT:   Consult Hip fracture protocol  ASSESSMENT:   66 y.o. male with medical history of seizure disorder, stroke, tobacco and alcohol abuse (6-7 beers/day), and cocaine use. He presented to the ED after falling down the steps while drinking alcohol. In the ED he was found to have a L hip fracture. Orthopedics was consulted and patient is pending surgery.  Reviewed I/O's: -600 ml x 24 hours and -4.6 L since admission  UOP: 600 ml x 24 hours   Pt remains with good appetite. Noted meal completions 50-100%. Pt is also drinking Ensure supplements.   Per TOC notes, plan to d/c home today.   Medications reviewed and include colace, folic acid, keppra, senokot, and thiamine.   Labs reviewed: CBGS: 112 (inpatient orders for glycemic control are none).    Diet Order:   Diet Order             Diet regular Room service appropriate? Yes; Fluid consistency: Thin  Diet effective now                   EDUCATION NEEDS:   Education needs have been addressed  Skin:  Skin Assessment: Skin Integrity Issues: Skin Integrity Issues:: Incisions Incisions: L hip (11/4)  Last BM:  10/17/22  Height:   Ht Readings from Last 1 Encounters:  10/16/22 5\' 9"  (1.753 m)    Weight:   Wt Readings from Last 1 Encounters:  10/16/22 56.7 kg    Ideal  Body Weight:  72.7 kg  BMI:  Body mass index is 18.46 kg/m.  Estimated Nutritional Needs:   Kcal:  2000-2200  Protein:  115-130 grams  Fluid:  > 2 L    13/04/23, RD, LDN, CDCES Registered Dietitian II Certified Diabetes Care and Education Specialist Please refer to [facility] for RD and/or RD on-call/weekend/after hours pager

## 2022-10-26 NOTE — Plan of Care (Signed)
Patient discharged per MD orders at this time.All dc instructions, education and medications reviewed with the patient.Pt expressed understanding & will comply with dc instructions.f/u appointments. was also communicated to the patient.No verbal c/o or any ssx of distress.Pt was discharged home with PT/OT services per order. Pt was transported home by brother in a privately owned vehicle.

## 2022-10-26 NOTE — Progress Notes (Signed)
Subjective:  POD # s/p 10.   Patient reports left hip pain as mild.  Patient has not been accepted at a skilled nursing facility.  I am told he will not qualify for home health PT given his history of cocaine use.  Patient has a wheelchair with leg supports and a walker.  He is being discharged home today.  Objective:   VITALS:   Vitals:   10/25/22 0748 10/25/22 1709 10/25/22 2323 10/26/22 0805  BP: 125/71 125/72 110/67 104/66  Pulse: (!) 53 (!) 56 (!) 51 (!) 52  Resp:   17 18  Temp: 98 F (36.7 C) 97.6 F (36.4 C) 98 F (36.7 C) 97.9 F (36.6 C)  TempSrc:      SpO2:  98% 97% 98%  Weight:      Height:        PHYSICAL EXAM: Left lower extremity Neurovascular intact Sensation intact distally Intact pulses distally Dorsiflexion/Plantar flexion intact Incision: dressing C/D/I No cellulitis present Compartment soft  LABS  Results for orders placed or performed during the hospital encounter of 10/16/22 (from the past 24 hour(s))  Basic metabolic panel     Status: Abnormal   Collection Time: 10/26/22  7:52 AM  Result Value Ref Range   Sodium 137 135 - 145 mmol/L   Potassium 4.1 3.5 - 5.1 mmol/L   Chloride 105 98 - 111 mmol/L   CO2 24 22 - 32 mmol/L   Glucose, Bld 92 70 - 99 mg/dL   BUN 26 (H) 8 - 23 mg/dL   Creatinine, Ser 4.28 0.61 - 1.24 mg/dL   Calcium 8.8 (L) 8.9 - 10.3 mg/dL   GFR, Estimated >76 >81 mL/min   Anion gap 8 5 - 15  CBC     Status: Abnormal   Collection Time: 10/26/22  7:52 AM  Result Value Ref Range   WBC 5.6 4.0 - 10.5 K/uL   RBC 3.54 (L) 4.22 - 5.81 MIL/uL   Hemoglobin 12.3 (L) 13.0 - 17.0 g/dL   HCT 15.7 (L) 26.2 - 03.5 %   MCV 100.8 (H) 80.0 - 100.0 fL   MCH 34.7 (H) 26.0 - 34.0 pg   MCHC 34.5 30.0 - 36.0 g/dL   RDW 59.7 41.6 - 38.4 %   Platelets 401 (H) 150 - 400 K/uL   nRBC 0.0 0.0 - 0.2 %  Magnesium     Status: None   Collection Time: 10/26/22  7:52 AM  Result Value Ref Range   Magnesium 2.3 1.7 - 2.4 mg/dL    No results  found.  Assessment/Plan: 10 Days Post-Op   Principal Problem:   Hip fracture (HCC) Active Problems:   Alcohol abuse   Hyponatremia   Cocaine abuse (HCC)   Tobacco abuse   Chronic back pain   Protein-calorie malnutrition, severe (HCC)   Closed displaced fracture of left femoral neck (HCC)  Patient is in an extremely difficult position.  He has not been accepted at a skilled nursing facility and home health PT will likely not work with the patient given his history of cocaine use and alcohol abuse.  Patient must remain toe-touch weightbearing on the left lower extremity for a total of 6 weeks postop.  I discussed this with him in detail at the bedside today.  Patient was instructed to use his wheelchair primarily and use the walker with toe-touch weightbearing for short distances.  Patient will be discharged on enteric-coated aspirin 325 mg p.o. twice daily for DVT prophylaxis.  He will  follow-up with me in 7 to 10 days for wound check and staple removal in my office.  Patient understands that if he prematurely ambulates on the left lower extremity the cannulated screws may break in the fracture may displace requiring him to undergo a left hemi-arthroplasty.  Patient expressed an understanding of my instructions.  He was in agreement with the above-noted plan.    Zachary Pugh , MD 10/26/2022, 11:32 AM

## 2022-10-26 NOTE — Discharge Summary (Signed)
Physician Discharge Summary  Zachary Pugh. RCV:[id number] DOB: [birth year] DOA: 10/16/2022  PCP: [facility]  Admit date: 10/16/2022 Discharge date: 10/26/2022  Admitted From: Home Disposition: Home  Recommendations for Outpatient Follow-up:  Aspirin 325 mg twice daily for DVT prophylaxis for 4 weeks per orthopedic.  I will also prescribe PPI to be taken daily before breakfast for 30 days for ulcer prophylaxis Home health has been arranged Follow-up outpatient orthopedic, Dr. Martha Clan, for suture removal in next 7-10 days Follow-up with PCP in next 1-2 weeks Pain medication and bowel regimen has been prescribed Outpatient home health services. Toe-touch weightbearing for 6 weeks on his left lower extremity   Home Health: PT/OT Equipment/Devices: Devices has been brought to his room Discharge Condition: Stable CODE STATUS: Full code Diet recommendation: Regular  Brief/Interim Summary: 66 y.o. male with medical history significant of seizure disorder, tobacco and alcohol abuse, cocaine abuse who present to the hospital with hip fracture.  S/P Open reduction and internal fixation was performed on 11/4.  Working with PT OT. Had mild hyponatremia improved. ALSO mild anemia Hb 10.3, for admission.   SEVERE malnutrition seen by dietitian.  Initial plans for SNF after extensive surgery by [facility] team we have not been able to secure SNF.  At this time we will send him home with home health services as patient has slowly progressed with therapy while being in the hospital.  We will maximize home health services, equipments to help him. Also discussed case with orthopedic today who recommends aspirin twice daily for DVT prophylaxis for 4 weeks.  Rest of the recommendations as stated above.         Acute left femoral head fracture: S/p ORIF 11/4, continue pain control DVT prophylaxis with Lovenox x4-week per orthopedics, continue pain control. He will remain toe-touch weightbearing  on the left lower extremity for a total of 6 weeks postop.  After extensive SNF search this was not successful.  Patient slowly improved with therapy while in the hospital therefore we will transition him home with home health.  Discussed with orthopedic, Dr. Martha Clan today who recommends aspirin 325 mg twice daily for 4 weeks and follow-up with him outpatient in next 7-10 days for suture removal. I have also added PPI daily for 30 days while on aspirin.  Pain medication with bowel regimen prescribed.  Hyponatremia:  Resolved   Alcohol abuse:   No signs of withdrawal counseled to quit using this   Severe protein calorie Adult: augment nutritional status with supplement as below.   Nutrition Problem: Severe Malnutrition Etiology: chronic illness (polysubstance abuse (ETOH and cocaine abuse)) Signs/Symptoms: moderate fat depletion, severe fat depletion, moderate muscle depletion, severe muscle depletion Interventions: Ensure Enlive (each supplement provides 350kcal and 20 grams of protein), MVI   Normocytic anemia:  Likely from chronic disease. PCP follow-up for further age-appropriate cancer screening work-up   Cocaine abuse/tobacco abuse:  Cessation advised     PT/OT = SNF    Discharge Diagnoses:  Principal Problem:   Hip fracture (HCC) Active Problems:   Alcohol abuse   Hyponatremia   Cocaine abuse (HCC)   Tobacco abuse   Chronic back pain   Protein-calorie malnutrition, severe (HCC)   Closed displaced fracture of left femoral neck (HCC)      Consultations: Orthopedic, Dr. Martha Clan  Subjective: Doing well no complaints  Discharge Exam: Vitals:   10/25/22 2323 10/26/22 0805  BP: 110/67 104/66  Pulse: (!) 51 (!) 52  Resp: 17 18  Temp: 98  F (36.7 C) 97.9 F (36.6 C)  SpO2: 97% 98%   Vitals:   10/25/22 0748 10/25/22 1709 10/25/22 2323 10/26/22 0805  BP: 125/71 125/72 110/67 104/66  Pulse: (!) 53 (!) 56 (!) 51 (!) 52  Resp:   17 18  Temp: 98 F (36.7  C) 97.6 F (36.4 C) 98 F (36.7 C) 97.9 F (36.6 C)  TempSrc:      SpO2:  98% 97% 98%  Weight:      Height:        General: Pt is alert, awake, not in acute distress, cachectic frail Cardiovascular: RRR, S1/S2 +, no rubs, no gallops Respiratory: CTA bilaterally, no wheezing, no rhonchi Abdominal: Soft, NT, ND, bowel sounds + Extremities: no edema, no cyanosis Surgical site of his hip looks okay  Discharge Instructions   Allergies as of 10/26/2022   No Known Allergies      Medication List     STOP taking these medications    amLODipine 5 MG tablet Commonly known as: NORVASC       TAKE these medications    aspirin EC 325 MG tablet Take 1 tablet (325 mg total) by mouth 2 (two) times daily.   docusate sodium 100 MG capsule Commonly known as: COLACE Take 1 capsule (100 mg total) by mouth 2 (two) times daily as needed for mild constipation or moderate constipation.   HYDROcodone-acetaminophen 5-325 MG tablet Commonly known as: NORCO/VICODIN Take 1-2 tablets by mouth every 4 (four) hours as needed for moderate pain (pain score 4-6).   levETIRAcetam 500 MG tablet Commonly known as: Keppra Take 1 tablet (500 mg total) by mouth 2 (two) times daily.   pantoprazole 40 MG tablet Commonly known as: Protonix Take 1 tablet (40 mg total) by mouth daily before breakfast.   polyethylene glycol 17 g packet Commonly known as: MIRALAX / GLYCOLAX Take 17 g by mouth daily as needed for severe constipation.               Durable Medical Equipment  (From admission, onward)           Start     Ordered   10/25/22 1433  For home use only DME Bedside commode  Once       Question:  Patient needs a bedside commode to treat with the following condition  Answer:  Impaired mobility   10/25/22 1432   10/25/22 1420  For home use only DME standard manual wheelchair with seat cushion  Once       Comments: Patient suffers from hip fracture and Impaired Mobility impairs their  ability to perform daily activities like ADLs in the home.  A walking aid will not resolve issue with performing activities of daily living. A wheelchair will allow patient to safely perform daily activities. Patient can safely propel the wheelchair in the home or has a caregiver who can provide assistance. Length of need 6 months. Accessories: elevating leg rests (ELRs), wheel locks, extensions and anti-tippers back cushion.   10/25/22 1431            Follow-up Information     [facility] Follow up in 1 week(s).   Contact information: [address] [place] [address] [phone]                No Known Allergies  You were cared for by a hospitalist during your hospital stay. If you have any questions about your discharge medications or the care you received while  you were in the hospital after you are discharged, you can call the unit and asked to speak with the hospitalist on call if the hospitalist that took care of you is not available. Once you are discharged, your primary care physician will handle any further medical issues. Please note that no refills for any discharge medications will be authorized once you are discharged, as it is imperative that you return to your primary care physician (or establish a relationship with a primary care physician if you do not have one) for your aftercare needs so that they can reassess your need for medications and monitor your lab values.   Procedures/Studies: DG Hip Port Unilat With Pelvis 1V Left  Result Date: 10/16/2022 CLINICAL DATA:  Status post pinning of a left hip fracture. EXAM: DG HIP (WITH OR WITHOUT PELVIS) 1V PORT LEFT COMPARISON:  CT obtained earlier today. FINDINGS: Interval placement of 4 pins bridging the previously demonstrated left femoral neck fracture with mild residual valgus angulation. IMPRESSION: Status post pinning of a left femoral neck fracture. Electronically Signed   By: Beckie Salts M.D.    On: 10/16/2022 20:32   DG HIP UNILAT WITH PELVIS 2-3 VIEWS LEFT  Result Date: 10/16/2022 CLINICAL DATA:  Closed fracture of left hip. EXAM: DG HIP (WITH OR WITHOUT PELVIS) 2-3V LEFT COMPARISON:  Preoperative imaging. FINDINGS: Six fluoroscopic spot views of the left hip obtained in the operating room. Four cannulated screws traverse the left femoral neck. Fluoroscopy time 1 minutes 3 seconds. Dose 5.4 mGy. IMPRESSION: Intraoperative fluoroscopy during ORIF of left femoral neck fracture. Electronically Signed   By: Narda Rutherford M.D.   On: 10/16/2022 18:06   DG C-Arm 1-60 Min-No Report  Result Date: 10/16/2022 Fluoroscopy was utilized by the requesting physician.  No radiographic interpretation.   CT HIP LEFT WO CONTRAST  Result Date: 10/16/2022 CLINICAL DATA:  Left hip fracture EXAM: CT OF THE LEFT HIP WITHOUT CONTRAST TECHNIQUE: Multidetector CT imaging of the left hip was performed according to the standard protocol. Multiplanar CT image reconstructions were also generated. RADIATION DOSE REDUCTION: This exam was performed according to the departmental dose-optimization program which includes automated exposure control, adjustment of the mA and/or kV according to patient size and/or use of iterative reconstruction technique. COMPARISON:  Same-day x-ray FINDINGS: Bones/Joint/Cartilage Acute transcervical fracture of the left femoral neck with approximately 1 cm of proximal migration and impaction. Mild anterior apex angulation. No evidence of fracture involvement in the intertrochanteric region. Hip joint alignment is maintained without dislocation. Bony pelvis intact without fracture or diastasis. No lytic or sclerotic bone lesion. Mild joint space narrowing of both hips. Ligaments Suboptimally assessed by CT. Muscles and Tendons Asymmetric fullness of the left iliopsoas musculature which may be due to muscle strain and small intramuscular hematoma. Soft tissues Soft tissue swelling/edema at the  fracture site. No organized fluid collection. No left inguinal lymphadenopathy. Atherosclerotic vascular calcifications. IMPRESSION: 1. Acute mildly displaced and angulated left femoral neck fracture. 2. Asymmetric fullness of the left iliopsoas musculature which may be due to muscle strain and small intramuscular hematoma. Electronically Signed   By: Duanne Guess D.O.   On: 10/16/2022 09:25   CT HEAD WO CONTRAST ( )  Result Date: 10/16/2022 CLINICAL DATA:  66 year old male history of trauma from a fall. EXAM: CT HEAD WITHOUT CONTRAST CT CERVICAL SPINE WITHOUT CONTRAST TECHNIQUE: Multidetector CT imaging of the head and cervical spine was performed following the standard protocol without intravenous contrast. Multiplanar CT image reconstructions of the cervical  spine were also generated. RADIATION DOSE REDUCTION: This exam was performed according to the departmental dose-optimization program which includes automated exposure control, adjustment of the mA and/or kV according to patient size and/or use of iterative reconstruction technique. COMPARISON:  No priors. FINDINGS: CT HEAD FINDINGS Brain: Moderate cerebral and mild cerebellar atrophy. Patchy and confluent areas of decreased attenuation are noted throughout the deep and periventricular white matter of the cerebral hemispheres bilaterally, compatible with chronic microvascular ischemic disease. No evidence of acute infarction, hemorrhage, hydrocephalus, extra-axial collection or mass lesion/mass effect. Vascular: No hyperdense vessel or unexpected calcification. Skull: Normal. Negative for fracture or focal lesion. Sinuses/Orbits: No acute finding. Other: None. CT CERVICAL SPINE FINDINGS Alignment: Normal. Skull base and vertebrae: No acute fracture. No primary bone lesion or focal pathologic process. Soft tissues and spinal canal: No prevertebral fluid or swelling. No visible canal hematoma. Disc levels: Multilevel degenerative disc disease most  evident at C4-C5, C5-C6 and C6-C7. Mild multilevel facet arthropathy. Upper chest: Extensive emphysema noted in the visualized lung apices. 6 x 4 mm (mean diameter 5 mm) pulmonary nodule in the left upper lobe near the apex (axial image 106 of series 2). Other: None. IMPRESSION: 1. No evidence of significant acute traumatic injury to the skull, brain or cervical spine. 2. Moderate cerebral and mild cerebellar atrophy with extensive chronic microvascular ischemic changes in the cerebral white matter. 3. Multilevel degenerative disc disease and cervical spondylosis, as above. 4. Advanced emphysematous changes noted in the lung apices. There is also a pulmonary nodule in the left upper lobe with a mean diameter of 5 mm. Follow-up noncontrast chest CT is recommended in 12 months to ensure the stability or regression of this finding.This recommendation follows the consensus statement: Guidelines for Management of Incidental Pulmonary Nodules Detected on CT Images: From the Fleischner Society 2017; Radiology 2017; 284:228-243. Electronically Signed   By: Trudie Reed M.D.   On: 10/16/2022 08:17   CT Cervical Spine Wo Contrast  Result Date: 10/16/2022 CLINICAL DATA:  66 year old male history of trauma from a fall. EXAM: CT HEAD WITHOUT CONTRAST CT CERVICAL SPINE WITHOUT CONTRAST TECHNIQUE: Multidetector CT imaging of the head and cervical spine was performed following the standard protocol without intravenous contrast. Multiplanar CT image reconstructions of the cervical spine were also generated. RADIATION DOSE REDUCTION: This exam was performed according to the departmental dose-optimization program which includes automated exposure control, adjustment of the mA and/or kV according to patient size and/or use of iterative reconstruction technique. COMPARISON:  No priors. FINDINGS: CT HEAD FINDINGS Brain: Moderate cerebral and mild cerebellar atrophy. Patchy and confluent areas of decreased attenuation are noted  throughout the deep and periventricular white matter of the cerebral hemispheres bilaterally, compatible with chronic microvascular ischemic disease. No evidence of acute infarction, hemorrhage, hydrocephalus, extra-axial collection or mass lesion/mass effect. Vascular: No hyperdense vessel or unexpected calcification. Skull: Normal. Negative for fracture or focal lesion. Sinuses/Orbits: No acute finding. Other: None. CT CERVICAL SPINE FINDINGS Alignment: Normal. Skull base and vertebrae: No acute fracture. No primary bone lesion or focal pathologic process. Soft tissues and spinal canal: No prevertebral fluid or swelling. No visible canal hematoma. Disc levels: Multilevel degenerative disc disease most evident at C4-C5, C5-C6 and C6-C7. Mild multilevel facet arthropathy. Upper chest: Extensive emphysema noted in the visualized lung apices. 6 x 4 mm (mean diameter 5 mm) pulmonary nodule in the left upper lobe near the apex (axial image 106 of series 2). Other: None. IMPRESSION: 1. No evidence of significant acute traumatic injury  to the skull, brain or cervical spine. 2. Moderate cerebral and mild cerebellar atrophy with extensive chronic microvascular ischemic changes in the cerebral white matter. 3. Multilevel degenerative disc disease and cervical spondylosis, as above. 4. Advanced emphysematous changes noted in the lung apices. There is also a pulmonary nodule in the left upper lobe with a mean diameter of 5 mm. Follow-up noncontrast chest CT is recommended in 12 months to ensure the stability or regression of this finding.This recommendation follows the consensus statement: Guidelines for Management of Incidental Pulmonary Nodules Detected on CT Images: From the Fleischner Society 2017; Radiology 2017; 284:228-243. Electronically Signed   By: Trudie Reed M.D.   On: 10/16/2022 08:17   DG Chest Port 1 View  Result Date: 10/16/2022 CLINICAL DATA:  66 year old male with history of trauma from a fall. EXAM:  PORTABLE CHEST 1 VIEW COMPARISON:  Chest x-ray 09/27/2022. FINDINGS: Lung volumes are normal. No consolidative airspace disease. No pleural effusions. No pneumothorax. No pulmonary nodule or mass noted. Pulmonary vasculature and the cardiomediastinal silhouette are within normal limits. Atherosclerosis in the thoracic aorta. IMPRESSION: 1.  No radiographic evidence of acute cardiopulmonary disease. 2. Aortic atherosclerosis. Electronically Signed   By: Trudie Reed M.D.   On: 10/16/2022 07:24   DG Hip Unilat W or Wo Pelvis 2-3 Views Left  Result Date: 10/16/2022 CLINICAL DATA:  66 year old male with history of left hip pain after recent fall. EXAM: DG HIP (WITH OR WITHOUT PELVIS) 2-3V LEFT COMPARISON:  No priors. FINDINGS: Mildly displaced fracture of the left femoral neck with mild proximal migration of the distal fracture fragment. Left femoral head remains located in the left acetabulum. Bony pelvic ring appears intact. Right proximal femur as visualized appears intact. There is joint space narrowing, subchondral sclerosis, subchondral cyst formation and osteophyte formation in the hip joints bilaterally, indicative of osteoarthritis. IMPRESSION: 1. Acute mildly displaced transcervical left femoral neck fracture, as above. 2. Moderate bilateral hip joint osteoarthritis. Electronically Signed   By: Trudie Reed M.D.   On: 10/16/2022 07:23   DG Ribs Unilateral W/Chest Left  Result Date: 09/27/2022 CLINICAL DATA:  Seizure. Fall with trauma to the left side of the chest EXAM: LEFT RIBS AND CHEST - 3+ VIEW COMPARISON:  11/28/2018 FINDINGS: Heart size is normal. Chronic aortic atherosclerosis is noted. Chronic emphysema and pulmonary scarring. There is what appears to be a healed or healing fracture of the left posterolateral ninth rib. No definite acute regional fractures seen. Skin marker put in place in the region of concern is lower. IMPRESSION: 1. No definite acute fracture. Old healed or healing  fracture of the left posterolateral ninth rib. 2. Chronic emphysema and pulmonary scarring. 3. Aortic atherosclerosis. Electronically Signed   By: Paulina Fusi M.D.   On: 09/27/2022 14:47     The results of significant diagnostics from this hospitalization (including imaging, microbiology, ancillary and laboratory) are listed below for reference.     Microbiology: No results found for this or any previous visit (from the past 240 hour(s)).   Labs: BNP (last 3 results) No results for input(s): "BNP" in the last 8760 hours. Basic Metabolic Panel: Recent Labs  Lab 10/20/22 0723 10/23/22 0811 10/26/22 0752  NA 135 138 137  K 4.3 4.3 4.1  CL 104 107 105  CO2 27 27 24   GLUCOSE 104* 102* 92  BUN 11 17 26*  CREATININE 0.94 0.99 1.08  CALCIUM 8.6* 8.7* 8.8*  MG 2.2 2.2 2.3   Liver Function Tests: No results  for input(s): "AST", "ALT", "ALKPHOS", "BILITOT", "PROT", "ALBUMIN" in the last 168 hours. No results for input(s): "LIPASE", "AMYLASE" in the last 168 hours. No results for input(s): "AMMONIA" in the last 168 hours. CBC: Recent Labs  Lab 10/20/22 0723 10/23/22 0811 10/26/22 0752  WBC 5.3 5.1 5.6  HGB 11.3* 11.5* 12.3*  HCT 32.7* 34.1* 35.7*  MCV 100.9* 101.2* 100.8*  PLT 252 304 401*   Cardiac Enzymes: No results for input(s): "CKTOTAL", "CKMB", "CKMBINDEX", "TROPONINI" in the last 168 hours. BNP: Invalid input(s): "POCBNP" CBG: No results for input(s): "GLUCAP" in the last 168 hours. D-Dimer No results for input(s): "DDIMER" in the last 72 hours. Hgb A1c No results for input(s): "HGBA1C" in the last 72 hours. Lipid Profile No results for input(s): "CHOL", "HDL", "LDLCALC", "TRIG", "CHOLHDL", "LDLDIRECT" in the last 72 hours. Thyroid function studies No results for input(s): "TSH", "T4TOTAL", "T3FREE", "THYROIDAB" in the last 72 hours.  Invalid input(s): "FREET3" Anemia work up No results for input(s): "VITAMINB12", "FOLATE", "FERRITIN", "TIBC", "IRON",  "RETICCTPCT" in the last 72 hours. Urinalysis    Component Value Date/Time   COLORURINE YELLOW (A) 02/17/2020 0947   APPEARANCEUR CLEAR (A) 02/17/2020 0947   LABSPEC 1.011 02/17/2020 0947   PHURINE 6.0 02/17/2020 0947   GLUCOSEU NEGATIVE 02/17/2020 0947   HGBUR NEGATIVE 02/17/2020 0947   BILIRUBINUR NEGATIVE 02/17/2020 0947   KETONESUR NEGATIVE 02/17/2020 0947   PROTEINUR NEGATIVE 02/17/2020 0947   NITRITE NEGATIVE 02/17/2020 0947   LEUKOCYTESUR NEGATIVE 02/17/2020 0947   Sepsis Labs Recent Labs  Lab 10/20/22 0723 10/23/22 0811 10/26/22 0752  WBC 5.3 5.1 5.6   Microbiology No results found for this or any previous visit (from the past 240 hour(s)).   Time coordinating discharge:  I have spent 35 minutes face to face with the patient and on the ward discussing the patients care, assessment, plan and disposition with other care givers. >50% of the time was devoted counseling the patient about the risks and benefits of treatment/Discharge disposition and coordinating care.   SIGNED:   Dimple NanasAnkit Chirag Ayame Rena, MD  [facility] 10/26/2022, 10:27 AM   If 7PM-7AM, please contact night-coverage

## 2022-10-26 NOTE — Progress Notes (Signed)
Physical Therapy Treatment Patient Details Name: Zachary Pugh. MRN: [id number] DOB: 01/09/1956 Today's Date: 10/26/2022   History of Present Illness Pt admitted for L hip fx s/p ORIF on 10/16/22 secondary to fall while going down stairs. History includes seizures, alcohol abuse, and cocaine abuse.    PT Comments    Pt resistant to session but does agree with max encouragement.  To EOB without assist.  He is able to get brakes on on his own and transfer with squat pivot to chair with supervision.  He is able to exit room in chair on his won using bed and wall to help navigate.  He does continue to have difficulty managing chair in open areas but is resistant to education and cues to make it easier for him often just putting hands on his lap when I try to give tips.  He returns to room and is asked to ambulate from door to bed with walker.  Initially agrees but after 4 steps he stops and puts hands on front of walker and indicates he is done.  He is assisted back to chair and transfers on his own back to bed and able to manage breaks on his own.    Pt continues to self limit mobility in session and resistant to education.  He has equipment in room for discharge. Will communicate with MD.   Recommendations for follow up therapy are one component of a multi-disciplinary discharge planning process, led by the attending physician.  Recommendations may be updated based on patient status, additional functional criteria and insurance authorization.  Follow Up Recommendations  Follow physician's recommendations for discharge plan and follow up therapies     Assistance Recommended at Discharge Intermittent Supervision/Assistance  Patient can return home with the following A little help with walking and/or transfers;A little help with bathing/dressing/bathroom;Assistance with cooking/housework;Direct supervision/assist for medications management;Direct supervision/assist for financial  management;Assist for transportation;Help with stairs or ramp for entrance   Equipment Recommendations  Wheelchair (measurements PT);Wheelchair cushion (measurements PT);BSC/3in1    Recommendations for Other Services       Precautions / Restrictions Precautions Precautions: Fall Restrictions Weight Bearing Restrictions: Yes LLE Weight Bearing: Touchdown weight bearing     Mobility  Bed Mobility Overal bed mobility: Modified Independent                  Transfers Overall transfer level: Needs assistance Equipment used: None, Rolling walker (2 wheels) Transfers: Bed to chair/wheelchair/BSC Sit to Stand: Supervision     Squat pivot transfers: Supervision, Min guard     General transfer comment: able to squat pivot to/from wheelchair with set up and supervision    Ambulation/Gait Ambulation/Gait assistance: Min guard Gait Distance (Feet): 3 Feet Assistive device: Rolling walker (2 wheels)   Gait velocity: decreased     General Gait Details: hop to pattern  - continues to keep NWB well   Dance movement psychotherapist Wheelchair mobility: Yes Wheelchair propulsion: Both upper extremities, Both lower extermities Distance: 100' with min assist and education/cues.  resistant to education Wheelchair Assistance Details (indicate cue type and reason): cues to use RUE more for power and steering.  Modified Rankin (Stroke Patients Only)       Balance Overall balance assessment: History of Falls, Needs assistance Sitting-balance support: Feet supported Sitting balance-Leahy Scale: Good     Standing balance support: Bilateral upper extremity supported, During functional activity, Reliant on assistive device  for balance Standing balance-Leahy Scale: Fair                              Cognition                                                Exercises      General Comments         Pertinent Vitals/Pain Pain Assessment Pain Assessment: No/denies pain Pain Intervention(s): Monitored during session    Home Living                          Prior Function            PT Goals (current goals can now be found in the care plan section) Progress towards PT goals: Progressing toward goals    Frequency    7X/week      PT Plan Current plan remains appropriate    Co-evaluation              AM-PAC PT "6 Clicks" Mobility   Outcome Measure  Help needed turning from your back to your side while in a flat bed without using bedrails?: None Help needed moving from lying on your back to sitting on the side of a flat bed without using bedrails?: None Help needed moving to and from a bed to a chair (including a wheelchair)?: A Little Help needed standing up from a chair using your arms (e.g., wheelchair or bedside chair)?: A Little Help needed to walk in hospital room?: A Little Help needed climbing 3-5 steps with a railing? : Total 6 Click Score: 18    End of Session Equipment Utilized During Treatment: Gait belt Activity Tolerance: Patient tolerated treatment well Patient left: in chair;with call bell/phone within reach;with chair alarm set Nurse Communication: Mobility status PT Visit Diagnosis: Muscle weakness (generalized) (M62.81);Difficulty in walking, not elsewhere classified (R26.2);History of falling (Z91.81);Pain Pain - Right/Left: Left Pain - part of body: Hip     Time: [id number] PT Time Calculation (min) (ACUTE ONLY): 12 min  Charges:  $Therapeutic Activity: 8-22 mins                   Chesley Noon, PTA 10/26/22, 9:50 AM

## 2022-11-26 ENCOUNTER — Emergency Department
Admission: EM | Admit: 2022-11-26 | Discharge: 2022-12-17 | Disposition: A | Discharge status: Home / Self Care | Payer: Medicare HMO | Attending: Student in an Organized Health Care Education/Training Program | Admitting: Student in an Organized Health Care Education/Training Program

## 2022-11-26 ENCOUNTER — Emergency Department: Payer: Medicare HMO

## 2022-11-26 ENCOUNTER — Other Ambulatory Visit: Payer: Self-pay

## 2022-11-26 DIAGNOSIS — Z8673 Personal history of transient ischemic attack (TIA), and cerebral infarction without residual deficits: Secondary | ICD-10-CM | POA: Insufficient documentation

## 2022-11-26 DIAGNOSIS — W06XXXA Fall from bed, initial encounter: Secondary | ICD-10-CM | POA: Diagnosis not present

## 2022-11-26 DIAGNOSIS — Z9181 History of falling: Secondary | ICD-10-CM | POA: Insufficient documentation

## 2022-11-26 DIAGNOSIS — Z049 Encounter for examination and observation for unspecified reason: Secondary | ICD-10-CM | POA: Insufficient documentation

## 2022-11-26 DIAGNOSIS — E876 Hypokalemia: Secondary | ICD-10-CM | POA: Insufficient documentation

## 2022-11-26 DIAGNOSIS — R4182 Altered mental status, unspecified: Secondary | ICD-10-CM | POA: Diagnosis not present

## 2022-11-26 DIAGNOSIS — G40909 Epilepsy, unspecified, not intractable, without status epilepticus: Secondary | ICD-10-CM | POA: Insufficient documentation

## 2022-11-26 DIAGNOSIS — F1911 Other psychoactive substance abuse, in remission: Secondary | ICD-10-CM | POA: Insufficient documentation

## 2022-11-26 DIAGNOSIS — S72002A Fracture of unspecified part of neck of left femur, initial encounter for closed fracture: Secondary | ICD-10-CM | POA: Diagnosis not present

## 2022-11-26 DIAGNOSIS — Z5900 Homelessness unspecified: Secondary | ICD-10-CM | POA: Diagnosis not present

## 2022-11-26 DIAGNOSIS — R531 Weakness: Secondary | ICD-10-CM | POA: Diagnosis not present

## 2022-11-26 DIAGNOSIS — Z91148 Patient's other noncompliance with medication regimen for other reason: Secondary | ICD-10-CM | POA: Diagnosis not present

## 2022-11-26 LAB — URINALYSIS, ROUTINE W REFLEX MICROSCOPIC
Bacteria, UA: NONE SEEN
Bilirubin Urine: NEGATIVE
Glucose, UA: NEGATIVE mg/dL
Ketones, ur: NEGATIVE mg/dL
Leukocytes,Ua: NEGATIVE
Nitrite: NEGATIVE
Protein, ur: 30 mg/dL — AB
Specific Gravity, Urine: 1.02 (ref 1.005–1.030)
pH: 6 (ref 5.0–8.0)

## 2022-11-26 LAB — COMPREHENSIVE METABOLIC PANEL
ALT: 21 U/L (ref 0–44)
AST: 49 U/L — ABNORMAL HIGH (ref 15–41)
Albumin: 3.4 g/dL — ABNORMAL LOW (ref 3.5–5.0)
Alkaline Phosphatase: 73 U/L (ref 38–126)
Anion gap: 14 (ref 5–15)
BUN: 25 mg/dL — ABNORMAL HIGH (ref 8–23)
CO2: 19 mmol/L — ABNORMAL LOW (ref 22–32)
Calcium: 8.8 mg/dL — ABNORMAL LOW (ref 8.9–10.3)
Chloride: 108 mmol/L (ref 98–111)
Creatinine, Ser: 1.35 mg/dL — ABNORMAL HIGH (ref 0.61–1.24)
GFR, Estimated: 58 mL/min — ABNORMAL LOW (ref >60)
Glucose, Bld: 139 mg/dL — ABNORMAL HIGH (ref 70–99)
Potassium: 2.9 mmol/L — ABNORMAL LOW (ref 3.5–5.1)
Sodium: 141 mmol/L (ref 135–145)
Total Bilirubin: 0.5 mg/dL (ref 0.3–1.2)
Total Protein: 6.8 g/dL (ref 6.5–8.1)

## 2022-11-26 LAB — CBC
HCT: 41 % (ref 39.0–52.0)
Hemoglobin: 13.3 g/dL (ref 13.0–17.0)
MCH: 33.6 pg (ref 26.0–34.0)
MCHC: 32.4 g/dL (ref 30.0–36.0)
MCV: 103.5 fL — ABNORMAL HIGH (ref 80.0–100.0)
Platelets: 228 10*3/uL (ref 150–400)
RBC: 3.96 MIL/uL — ABNORMAL LOW (ref 4.22–5.81)
RDW: 13.1 % (ref 11.5–15.5)
WBC: 7.7 10*3/uL (ref 4.0–10.5)
nRBC: 0 % (ref 0.0–0.2)

## 2022-11-26 LAB — URINE DRUG SCREEN, QUALITATIVE (ARMC ONLY)
Amphetamines, Ur Screen: NOT DETECTED
Barbiturates, Ur Screen: NOT DETECTED
Benzodiazepine, Ur Scrn: NOT DETECTED
Cannabinoid 50 Ng, Ur ~~LOC~~: NOT DETECTED
Cocaine Metabolite,Ur ~~LOC~~: POSITIVE — AB
MDMA (Ecstasy)Ur Screen: NOT DETECTED
Methadone Scn, Ur: NOT DETECTED
Opiate, Ur Screen: NOT DETECTED
Phencyclidine (PCP) Ur S: NOT DETECTED
Tricyclic, Ur Screen: NOT DETECTED

## 2022-11-26 LAB — ACETAMINOPHEN LEVEL: Acetaminophen (Tylenol), Serum: <10 ug/mL — ABNORMAL LOW (ref 10–30)

## 2022-11-26 LAB — ETHANOL: Alcohol, Ethyl (B): <10 mg/dL (ref <10)

## 2022-11-26 LAB — SALICYLATE LEVEL: Salicylate Lvl: <7 mg/dL — ABNORMAL LOW (ref 7.0–30.0)

## 2022-11-26 MED ORDER — LEVETIRACETAM IN NACL 500 MG/100ML IV SOLN
500.0000 mg | Freq: Once | INTRAVENOUS | Status: AC
  Administered 2022-11-27: 500 mg via INTRAVENOUS
  Filled 2022-11-26: qty 100

## 2022-11-26 MED ORDER — POTASSIUM CHLORIDE CRYS ER 20 MEQ PO TBCR
40.0000 meq | EXTENDED_RELEASE_TABLET | Freq: Every day | ORAL | Status: DC
  Administered 2022-11-26 – 2022-12-08 (×13): 40 meq via ORAL
  Filled 2022-11-26 (×13): qty 2

## 2022-11-26 MED ORDER — ONDANSETRON HCL 4 MG/2ML IJ SOLN
4.0000 mg | Freq: Once | INTRAMUSCULAR | Status: AC
  Administered 2022-11-26: 4 mg via INTRAVENOUS
  Filled 2022-11-26: qty 2

## 2022-11-26 MED ORDER — POTASSIUM CHLORIDE 10 MEQ/100ML IV SOLN
10.0000 meq | Freq: Once | INTRAVENOUS | Status: AC
  Administered 2022-11-26: 10 meq via INTRAVENOUS
  Filled 2022-11-26: qty 100

## 2022-11-26 MED ORDER — PANTOPRAZOLE SODIUM 40 MG PO TBEC
40.0000 mg | DELAYED_RELEASE_TABLET | Freq: Every day | ORAL | Status: DC
  Administered 2022-11-27 – 2022-12-17 (×21): 40 mg via ORAL
  Filled 2022-11-26 (×21): qty 1

## 2022-11-26 MED ORDER — LEVETIRACETAM 500 MG PO TABS
500.0000 mg | ORAL_TABLET | Freq: Two times a day (BID) | ORAL | Status: DC
  Administered 2022-11-26 – 2022-12-12 (×32): 500 mg via ORAL
  Filled 2022-11-26 (×33): qty 1

## 2022-11-26 MED ORDER — POLYETHYLENE GLYCOL 3350 17 G PO PACK: 17.0000 g | PACK | Freq: Every day | ORAL | Status: DC | PRN

## 2022-11-26 MED ORDER — SODIUM CHLORIDE 0.9 % IV BOLUS
500.0000 mL | Freq: Once | INTRAVENOUS | Status: AC
  Administered 2022-11-26: 500 mL via INTRAVENOUS

## 2022-11-26 NOTE — ED Provider Notes (Signed)
[facility] Provider Note    Event Date/Time   First MD Initiated Contact with Patient 11/26/22 1908     (approximate)   History   evaluation   HPI  Zachary Berkel. is a 66 y.o. male with a history of seizure disorder brought into the ER by family for placement.  Patient's primary care taker just recently died.  The patient's niece and nephew are now helping arrange care for the patient but found him in a house without running water or heat.  He was in a very poor state condition.  APS is involved.  They are currently working on placement.  Poorly has not been taking his medications.  States he has had some falls.  Does have reported history of substance abuse but denies any use recently.     Physical Exam   Triage Vital Signs: ED Triage Vitals  Enc Vitals Group     BP 11/26/22 1436 136/76     Pulse Rate 11/26/22 1436 89     Resp 11/26/22 1436 16     Temp 11/26/22 1436 98.6 F (37 C)     Temp src --      SpO2 11/26/22 1436 98 %     Weight --      Height --      Head Circumference --      [facility] --      Pain Score 11/26/22 1440 0     Pain Loc --      Pain Edu? --      Excl. in GC? --     Most recent vital signs: Vitals:   11/26/22 1436 11/26/22 2015  BP: 136/76   Pulse: 89 81  Resp: 16 (!) 23  Temp: 98.6 F (37 C)   SpO2: 98% 100%     Constitutional: Alert, disheveled Eyes: Conjunctivae are normal.  Head: Atraumatic. Nose: No congestion/rhinnorhea. Mouth/Throat: Mucous membranes are moist.   Neck: Painless ROM.  Cardiovascular:   Good peripheral circulation. Respiratory: Normal respiratory effort.  No retractions.  Gastrointestinal: Soft and nontender.  Musculoskeletal:  no deformity.  Left thigh surgical scar with staples still intact but no surrounding fluctuance or erythema.  No purulence. Neurologic:  MAE spontaneously. No gross focal neurologic deficits are appreciated.  Skin:  Skin is warm, dry and intact. No rash  noted. Psychiatric: Mood and affect are normal. Speech and behavior are normal.    ED Results / Procedures / Treatments   Labs (all labs ordered are listed, but only abnormal results are displayed) Labs Reviewed  COMPREHENSIVE METABOLIC PANEL - Abnormal; Notable for the following components:      Result Value   Potassium 2.9 (*)    CO2 19 (*)    Glucose, Bld 139 (*)    BUN 25 (*)    Creatinine, Ser 1.35 (*)    Calcium 8.8 (*)    Albumin 3.4 (*)    AST 49 (*)    GFR, Estimated 58 (*)    All other components within normal limits  SALICYLATE LEVEL - Abnormal; Notable for the following components:   Salicylate Lvl <7.0 (*)    All other components within normal limits  ACETAMINOPHEN LEVEL - Abnormal; Notable for the following components:   Acetaminophen (Tylenol), Serum <10 (*)    All other components within normal limits  CBC - Abnormal; Notable for the following components:   RBC 3.96 (*)    MCV 103.5 (*)  All other components within normal limits  ETHANOL  URINE DRUG SCREEN, QUALITATIVE ([facility] ONLY)  URINALYSIS, ROUTINE W REFLEX MICROSCOPIC     RADIOLOGY Please see ED Course for my review and interpretation.  I personally reviewed all radiographic images ordered to evaluate for the above acute complaints and reviewed radiology reports and findings.  These findings were personally discussed with the patient.  Please see medical record for radiology report.    PROCEDURES:  Critical Care performed: No  Procedures   MEDICATIONS ORDERED IN ED: Medications  levETIRAcetam (KEPPRA) tablet 500 mg (has no administration in time range)  pantoprazole (PROTONIX) EC tablet 40 mg (has no administration in time range)  polyethylene glycol (MIRALAX / GLYCOLAX) packet 17 g (has no administration in time range)  potassium chloride SA (KLOR-CON M) CR tablet 40 mEq (has no administration in time range)  sodium chloride 0.9 % bolus 500 mL (0 mLs Intravenous Stopped 11/26/22 2014)      IMPRESSION / MDM / ASSESSMENT AND PLAN / ED COURSE  I reviewed the triage vital signs and the nursing notes.                              Differential diagnosis includes, but is not limited to, homelessness, SDH, IPH, electrolyte abnormality, UTI, deconditioning  Presented to ER for evaluation of symptoms as described above.  Does not have any specific complaints at this time.  Mildly hypokalemia likely secondary to poor p.o. intake.  Imaging will be ordered as he has had some falls but he has no focal deficits.  PT OT will be consulted.  TOC consulted.  Home meds reordered.      FINAL CLINICAL IMPRESSION(S) / ED DIAGNOSES   Final diagnoses:  Homeless     Rx / DC Orders   ED Discharge Orders     None        Note:  This document was prepared using Dragon voice recognition software and may include unintentional dictation errors.    Willy Eddy, MD 11/26/22 2059

## 2022-11-26 NOTE — ED Notes (Signed)
Pt pulled off all telemetry leads, refusing to put them back on.

## 2022-11-26 NOTE — ED Notes (Signed)
Pt also had recent hip surgery on left and it needs to be looked at. Stitches are still in place. Family would like it looked at and make sure no infection.

## 2022-11-26 NOTE — ED Triage Notes (Signed)
Pt to ED with niece and nephew for "evaluation". Niece states caregiver just passed away and she is working with social work to get custody. Social work is aware that he is here.   Nephew states house does not have any water and pt needs to be wiped all the way down  Pt disoriented.   Pt uses cocaine. Daily ETOH

## 2022-11-27 DIAGNOSIS — E876 Hypokalemia: Secondary | ICD-10-CM | POA: Diagnosis not present

## 2022-11-27 LAB — BASIC METABOLIC PANEL
Anion gap: 4 — ABNORMAL LOW (ref 5–15)
BUN: 16 mg/dL (ref 8–23)
CO2: 21 mmol/L — ABNORMAL LOW (ref 22–32)
Calcium: 7.1 mg/dL — ABNORMAL LOW (ref 8.9–10.3)
Chloride: 115 mmol/L — ABNORMAL HIGH (ref 98–111)
Creatinine, Ser: 0.74 mg/dL (ref 0.61–1.24)
GFR, Estimated: >60 mL/min (ref >60)
Glucose, Bld: 99 mg/dL (ref 70–99)
Potassium: 2.8 mmol/L — ABNORMAL LOW (ref 3.5–5.1)
Sodium: 140 mmol/L (ref 135–145)

## 2022-11-27 MED ORDER — TRAZODONE HCL 50 MG PO TABS
50.0000 mg | ORAL_TABLET | Freq: Every evening | ORAL | Status: DC | PRN
  Administered 2022-11-28 – 2022-12-08 (×9): 50 mg via ORAL
  Filled 2022-11-27 (×9): qty 1

## 2022-11-27 MED ORDER — LORAZEPAM 2 MG/ML IJ SOLN
0.0000 mg | Freq: Four times a day (QID) | INTRAMUSCULAR | Status: AC
  Administered 2022-11-27 (×2): 1 mg via INTRAVENOUS
  Administered 2022-11-27: 2 mg via INTRAVENOUS
  Filled 2022-11-27 (×3): qty 1

## 2022-11-27 MED ORDER — THIAMINE MONONITRATE 100 MG PO TABS
100.0000 mg | ORAL_TABLET | Freq: Every day | ORAL | Status: DC
  Administered 2022-11-27 – 2022-12-17 (×19): 100 mg via ORAL
  Filled 2022-11-27 (×21): qty 1

## 2022-11-27 MED ORDER — LORAZEPAM 2 MG PO TABS
0.0000 mg | ORAL_TABLET | Freq: Four times a day (QID) | ORAL | Status: AC
  Filled 2022-11-27: qty 1

## 2022-11-27 MED ORDER — LORAZEPAM 2 MG PO TABS: 0.0000 mg | ORAL_TABLET | Freq: Two times a day (BID) | ORAL | Status: AC

## 2022-11-27 MED ORDER — LORAZEPAM 2 MG/ML IJ SOLN: 0.0000 mg | Freq: Two times a day (BID) | INTRAMUSCULAR | Status: AC

## 2022-11-27 MED ORDER — THIAMINE HCL 100 MG/ML IJ SOLN
100.0000 mg | Freq: Every day | INTRAMUSCULAR | Status: DC
  Administered 2022-11-28 – 2022-12-07 (×2): 100 mg via INTRAVENOUS
  Filled 2022-11-27 (×5): qty 2

## 2022-11-27 NOTE — Progress Notes (Signed)
Pending PT eval.

## 2022-11-27 NOTE — ED Notes (Addendum)
Pts visitor in hall yelling for help. Pt at end of bed and appears to be in no acute distress. Pts visitor yelling and making threats to staff. While repositioning, pts brief and linens changed. Bed fall alarm in place.

## 2022-11-27 NOTE — Progress Notes (Signed)
CSW received a call from patients niece April, [phone]. Ms. 30-Mar-2023 explained to CSW that patients legal guardian was her father Donjuan Robison who recently passed away this week. 2023/03/30 stated they were both living in the home together. 03/30/23 stated she is trying to get guardianship of her uncle and is working with Nancy Fetter with [facility]. 2023/03/30 stated that patient has no place to go and that [name] was looking at placing patient at [facility]. April told CSW that patient has medicaid and she can provide that number and make sure registration uploads his information to his chart. March 30, 2023 stated that she was not aware that her uncle was using drugs and understands finding placement might be difficult. CSW informed 2023-03-30 that SNF referrals have been sent out.

## 2022-11-27 NOTE — ED Provider Notes (Signed)
-----------------------------------------   5:46 AM on 11/27/2022 -----------------------------------------   Blood pressure 94/65, pulse 71, temperature 98.6 F (37 C), resp. rate (!) 23, SpO2 97 %.  The patient is calm and cooperative at this time.  There have been no acute events since the last update.  Awaiting disposition plan from case management/social work.    Arthur Speagle, Layla Maw, DO 11/27/22 [phone]

## 2022-11-27 NOTE — ED Provider Notes (Signed)
I spoke with patient's niece who is at bedside and is currently quite frustrated about the current situation.  She apparently talked to the Child psychotherapist and was told that he may not be able to place placed in a SNF because of active drug use.  I spoke with Carollee Herter the on-call social worker who tells me that on review of patient's last admission last month he was unable to be placed because of active cocaine use and she is concerned that this is a possibility again now.  She tells me she tried to convey this to the patient's niece but she became very angry and would not answer questions or listen to Child psychotherapist.    Discussed with social worker that patient really does not have a safe place to go as his house is a hoarding situation there is no heat or running water and patient is fragile and weak.  We will need to continue to try to place in him in SNF while he boards in the ED.  Can recheck UDS in several days to see if this is cleared.  Discussed with patient's niece that we are still doing everything we can to get him placed and she was appreciative.   Georga Hacking, MD 11/27/22 [phone]

## 2022-11-27 NOTE — ED Notes (Signed)
AC at bedside to speak with patient's niece regarding concerns per her initial request.

## 2022-11-27 NOTE — Progress Notes (Addendum)
CSW attempted to speak with [name]. CSW tried to explain that due to patients current substance use that it might be hard to place him in a SNF. CSW tried to explain that he was denied last month due to drug usage. Patients niece began to get upset and stated that she is working with DSS and that she was supposed to be speaking with [name] the social worker on Monday to assist with placement. Zachary Pugh stated that the ED provider told her that he wanted to start placement now. Patients niece told CSW that clearly no one is on the same page. CSW was not able to explain that DSS is separate from the hospital due to patients niece being upset. Zachary Pugh told CSW that patient only had a beer. Patients niece continued to yell and ask if CSW was going to help or not. CSW was not able to explain the SNF process or gather information. Patients niece hung up the phone. CSW will attempt SNF process at another time.

## 2022-11-27 NOTE — ED Notes (Signed)
Patient resting calmly at this time. Patient informed to use call bell if he needs anything/ assistance.

## 2022-11-27 NOTE — ED Notes (Signed)
Patient's urinary suction cannister emptied. Patient and family are resting calmly at this time. Informed patient and family to use call bell if they need anything.

## 2022-11-27 NOTE — [place] FL2 (Signed)
[place] MEDICAID FL2 LEVEL OF CARE FORM     IDENTIFICATION  Patient Name: Zachary Pugh. Birthdate: October 02, 1956 Sex: male Admission Date (Current Location): 11/26/2022  [facility] and [place] Number:  Chiropodist and Address:  [facility], [address] [place], [place] [address]      Provider Number: [id number]  Attending Physician Name and Address:  No att. providers found  Relative Name and Phone Number:  April, Niece    Current Level of Care: Hospital Recommended Level of Care: Skilled Nursing Facility Prior Approval Number:    Date Approved/Denied:   PASRR Number: [id number]  Discharge Plan: SNF    Current Diagnoses: Patient Active Problem List   Diagnosis Date Noted   Hip fracture (HCC) 10/16/2022   Alcohol abuse 10/16/2022   Hyponatremia 10/16/2022   Cocaine abuse (HCC) 10/16/2022   Tobacco abuse 10/16/2022   Chronic back pain 10/16/2022   Protein-calorie malnutrition, severe (HCC) 10/16/2022   Closed displaced fracture of left femoral neck (HCC) 10/16/2022   Alcohol withdrawal (HCC) 11/28/2018    Orientation RESPIRATION BLADDER Height & Weight     Self, Time  Normal Continent Weight:  125 lbs Height:   5"9  BEHAVIORAL SYMPTOMS/MOOD NEUROLOGICAL BOWEL NUTRITION STATUS      Continent Diet (Regular)  AMBULATORY STATUS COMMUNICATION OF NEEDS Skin   Extensive Assist Verbally Bruising (L hip with minor bruising around incision)                       Personal Care Assistance Level of Assistance  Feeding, Bathing, Dressing Bathing Assistance: Limited assistance Feeding assistance: Independent Dressing Assistance: Limited assistance     Functional Limitations Info  Sight, Hearing, Speech Sight Info: Adequate Hearing Info: Adequate Speech Info: Adequate    SPECIAL CARE FACTORS FREQUENCY  PT (By licensed PT)     PT Frequency: Min 2X/week              Contractures Contractures Info: Not present     Additional Factors Info  Code Status, Allergies Code Status Info: Prior Full Allergies Info: No known listed           Current Medications (11/27/2022):  This is the current hospital active medication list Current Facility-Administered Medications  Medication Dose Route Frequency Provider Last Rate Last Admin   levETIRAcetam (KEPPRA) tablet 500 mg  500 mg Oral BID Willy Eddy, MD   500 mg at 11/27/22 0946   LORazepam (ATIVAN) injection 0-4 mg  0-4 mg Intravenous Q6H Ward, Kristen N, DO   1 mg at 11/27/22 3664   Or   LORazepam (ATIVAN) tablet 0-4 mg  0-4 mg Oral Q6H Ward, Layla Maw, DO       [START ON 11/29/2022] LORazepam (ATIVAN) injection 0-4 mg  0-4 mg Intravenous Q12H Ward, Kristen N, DO       Or   [START ON 11/29/2022] LORazepam (ATIVAN) tablet 0-4 mg  0-4 mg Oral Q12H Ward, Kristen N, DO       pantoprazole (PROTONIX) EC tablet 40 mg  40 mg Oral QAC breakfast Willy Eddy, MD   40 mg at 11/27/22 0945   polyethylene glycol (MIRALAX / GLYCOLAX) packet 17 g  17 g Oral Daily PRN Willy Eddy, MD       potassium chloride SA (KLOR-CON M) CR tablet 40 mEq  40 mEq Oral Daily Willy Eddy, MD   40 mEq at 11/27/22 0945   thiamine (VITAMIN B1) tablet 100 mg  100 mg Oral Daily Ward, Kristen N, DO   100 mg at 11/27/22 0945   Or   thiamine (VITAMIN B1) injection 100 mg  100 mg Intravenous Daily Ward, Kristen N, DO       Current Outpatient Medications  Medication Sig Dispense Refill   docusate sodium (COLACE) 100 MG capsule Take 1 capsule (100 mg total) by mouth 2 (two) times daily as needed for mild constipation or moderate constipation. (Patient not taking: Reported on 11/26/2022) 60 capsule 0   HYDROcodone-acetaminophen (NORCO/VICODIN) 5-325 MG tablet Take 1-2 tablets by mouth every 4 (four) hours as needed for moderate pain (pain score 4-6). (Patient not taking: Reported on 11/26/2022) 20 tablet 0   levETIRAcetam (KEPPRA) 500 MG tablet Take 1 tablet (500 mg total) by  mouth 2 (two) times daily. (Patient not taking: Reported on 10/16/2022) 30 tablet 1   pantoprazole (PROTONIX) 40 MG tablet Take 1 tablet (40 mg total) by mouth daily before breakfast. 30 tablet 0   polyethylene glycol (MIRALAX / GLYCOLAX) 17 g packet Take 17 g by mouth daily as needed for severe constipation. (Patient not taking: Reported on 11/26/2022) 14 each 0     Discharge Medications: Please see discharge summary for a list of discharge medications.  Relevant Imaging Results:  Relevant Lab Results:   Additional Information SSN: [id number]  Raina Mina, LCSWA

## 2022-11-27 NOTE — ED Notes (Signed)
Pt given [place] sandwich tray with 4 cups of water. Pt eating and drinking with nieces' assistance at this time.

## 2022-11-27 NOTE — Evaluation (Signed)
Physical Therapy Evaluation Patient Details Name: Zachary Pugh. MRN: [id number] DOB: 02-Oct-1956 Today's Date: 11/27/2022  History of Present Illness  Zachary Pugh. is a 66 y.o. male with a history of seizure disorder brought into the ER by family for placement.  Patient's primary care taker just recently died.  The patient's niece and nephew are now helping arrange care for the patient but found him in a house without running water or heat.  He was in a very poor state condition.  APS is involved.  They are currently working on placement.  Poorly has not been taking his medications.  States he has had some falls.  Does have reported history of substance abuse but denies any use recently.   Clinical Impression  Pt admitted with above diagnosis. Pt received upright in bed agreeable to PT eval. Known to PT from prior admission for L hip ORIF on 10/16/22. Pt A&O x2 to self and time. Unsure of pt's true understanding why he is here. Stated he was at home. Pt's L hip with minor bruising around incision. General incision red. No significant swelling, oozing, foul odors. Pt able to actively perform L hip abduction, flexion, and SLR without pain.   To date, pt modA +1 for bed mobility and STS transfer. Pt unable to maintain prior L hip TTWB status with significant WB on LLE and RLE. Unable to perform RLE single leg hop or pivot to HOB. Pt modA to return to supine at LE's. Pt globally weaker compared to prior admission. Pt was performing hop to pattern 40' in room supervision to CGA with RW when admitted in November. Further OOB mobility deferred due to need for updates on WB precautions. Reached care team and orthopedic surgeon via secure chat asking for updated ortho consult.  Pt in ED cot with all needs and niece present. Pt currently with functional limitations due to the deficits listed below (see PT Problem List). Pt will benefit from skilled PT to increase their independence and safety with  mobility to allow discharge to the venue listed below.     Recommendations for follow up therapy are one component of a multi-disciplinary discharge planning process, led by the attending physician.  Recommendations may be updated based on patient status, additional functional criteria and insurance authorization.  Follow Up Recommendations Skilled nursing-short term rehab (<3 hours/day) Can patient physically be transported by private vehicle: No    Assistance Recommended at Discharge Frequent or constant Supervision/Assistance  Patient can return home with the following  A lot of help with walking and/or transfers;A lot of help with bathing/dressing/bathroom;Assist for transportation;Help with stairs or ramp for entrance    Equipment Recommendations Other (comment) (TBD by next venue of care)  Recommendations for Other Services       Functional Status Assessment Patient has had a recent decline in their functional status and demonstrates the ability to make significant improvements in function in a reasonable and predictable amount of time.     Precautions / Restrictions Precautions Precaution Comments: L hip ORIF on 10/16/22. Restrictions Other Position/Activity Restrictions: Unsure of current WB status. Requested ortho consult via secure chat.      Mobility  Bed Mobility Overal bed mobility: Needs Assistance Bed Mobility: Supine to Sit, Sit to Supine     Supine to sit: Min assist, HOB elevated Sit to supine: Mod assist     Patient Response: Cooperative  Transfers Overall transfer level: Needs assistance Equipment used: Rolling walker (2 wheels) Transfers: Sit  to/from Stand Sit to Stand: Mod assist                Ambulation/Gait               General Gait Details: deferred due to inability to maintain prior L hip WB precautions.  Stairs            Wheelchair Mobility    Modified Rankin (Stroke Patients Only)       Balance Overall balance  assessment: Needs assistance Sitting-balance support: Bilateral upper extremity supported, Feet supported Sitting balance-Leahy Scale: Fair     Standing balance support: Bilateral upper extremity supported, During functional activity, Reliant on assistive device for balance Standing balance-Leahy Scale: Poor Standing balance comment: use of RW                             Pertinent Vitals/Pain Pain Assessment Pain Assessment: No/denies pain    Home Living Family/patient expects to be discharged to:: Private residence Living Arrangements: Other relatives (brother) Available Help at Discharge: Other (Comment) (brother passed away. No support now.) Type of Home: House Home Access: Ramped entrance       Home Layout: One level Home Equipment: Conservation officer, nature (2 wheels);Cane - single point;Wheelchair - manual      Prior Function Prior Level of Function : Needs assist             Mobility Comments: Niece unable to provide recent PLOF. Neither is patient. ADLs Comments: Niece unable to provide recent PLOF. Neither is patient.     Hand Dominance        Extremity/Trunk Assessment   Upper Extremity Assessment Upper Extremity Assessment: Generalized weakness    Lower Extremity Assessment Lower Extremity Assessment: Generalized weakness;LLE deficits/detail LLE Deficits / Details: L hip ORIF on 10/16/22. Was TTWB prior.       Communication      Cognition Arousal/Alertness: Awake/alert   Overall Cognitive Status: Difficult to assess                                 General Comments: A&O to time and self.        General Comments      Exercises Other Exercises Other Exercises: Role of PT in acute setting, d/c recs, WB precautions from prior L hip ORIF   Assessment/Plan    PT Assessment Patient needs continued PT services  PT Problem List Decreased strength;Decreased cognition;Decreased range of motion;Decreased knowledge of use of  DME;Decreased activity tolerance;Decreased safety awareness;Decreased balance;Decreased knowledge of precautions;Decreased mobility       PT Treatment Interventions DME instruction;Balance training;Gait training;Neuromuscular re-education;Stair training;Cognitive remediation;Functional mobility training;Patient/family education;Therapeutic activities;Therapeutic exercise    PT Goals (Current goals can be found in the Care Plan section)  Acute Rehab PT Goals Patient Stated Goal: find placement PT Goal Formulation: With family Time For Goal Achievement: 12/11/22 Potential to Achieve Goals: Fair    Frequency Min 2X/week     Co-evaluation               AM-PAC PT "6 Clicks" Mobility  Outcome Measure Help needed turning from your back to your side while in a flat bed without using bedrails?: A Lot Help needed moving from lying on your back to sitting on the side of a flat bed without using bedrails?: A Lot Help needed moving to and from a bed to a  chair (including a wheelchair)?: Total Help needed standing up from a chair using your arms (e.g., wheelchair or bedside chair)?: A Lot Help needed to walk in hospital room?: Total Help needed climbing 3-5 steps with a railing? : Total 6 Click Score: 9    End of Session Equipment Utilized During Treatment: Gait belt Activity Tolerance: Other (comment) (limited due to WB precautions)   Nurse Communication: Mobility status PT Visit Diagnosis: Unsteadiness on feet (R26.81);Muscle weakness (generalized) (M62.81);History of falling (Z91.81);Difficulty in walking, not elsewhere classified (R26.2)    Time: 1047-1110 PT Time Calculation (min) (ACUTE ONLY): 23 min   Charges:   PT Evaluation $PT Eval Moderate Complexity: 1 Mod PT Treatments $Therapeutic Activity: 8-22 mins   Alferd Obryant M. Fairly IV, PT, DPT Physical Therapist- [facility]  11/27/2022, 11:58 AM

## 2022-11-27 NOTE — ED Notes (Signed)
Patient is sleeping at this time.

## 2022-11-28 DIAGNOSIS — E876 Hypokalemia: Secondary | ICD-10-CM | POA: Diagnosis not present

## 2022-11-28 NOTE — ED Notes (Signed)
Pt's niece requesting the Nursing Supervisor to the room. AC contacted.

## 2022-11-28 NOTE — Evaluation (Signed)
Occupational Therapy Evaluation Patient Details Name: Zachary Pugh. MRN: [id number] DOB: December 04, 1956 Today's Date: 11/28/2022   History of Present Illness Zachary Pugh. is a 66 y.o. male with a history of seizure disorder brought into the ER by family for placement.  Patient's primary care taker just recently died.  The patient's niece and nephew are now helping arrange care for the patient but found him in a house without running water or heat.  He was in a very poor state condition.  APS is involved.  They are currently working on placement.  Does have reported history of substance abuse but denies any use recently.   Clinical Impression   Chart reviewed, pt greeted in room with niece present. Pt is oriented to self only, increased time/multi modal cueing for all task participation. PTA pt was requiring some degree of assist for ADL, primary caregiver has recently passed away (brother). Pt presents with deficits in strength, endurance, activity tolerance, cognition, balance affecting safe an optimal ADL completion. Poor adherence to [place] precautions during attempted transfer with RW requiring MOD A and step by step cueing for safety. Noted R lateral lean in sitting requiring facilitation to correct. Team notified re: 6 weeks s/p surgery, ortho consult for potential change in [place] status as appropriate. Pt is left in bedside chair, all needs met. OT will continue to follow acutely.      Recommendations for follow up therapy are one component of a multi-disciplinary discharge planning process, led by the attending physician.  Recommendations may be updated based on patient status, additional functional criteria and insurance authorization.   Follow Up Recommendations  Skilled nursing-short term rehab (<3 hours/day)     Assistance Recommended at Discharge Frequent or constant Supervision/Assistance  Patient can return home with the following A lot of help with walking and/or  transfers;A lot of help with bathing/dressing/bathroom    Functional Status Assessment  Patient has had a recent decline in their functional status and demonstrates the ability to make significant improvements in function in a reasonable and predictable amount of time.  Equipment Recommendations  Other (comment) (defer)    Recommendations for Other Services       Precautions / Restrictions Precautions Precautions: Fall Precaution Comments: L hip ORIF on 10/16/22. Restrictions Other Position/Activity Restrictions: last ortho note TTWB;      Mobility Bed Mobility Overal bed mobility: Needs Assistance Bed Mobility: Supine to Sit     Supine to sit: Mod assist, HOB elevated          Transfers Overall transfer level: Needs assistance Equipment used: Rolling walker (2 wheels) Transfers: Sit to/from Stand Sit to Stand: Mod assist           General transfer comment: step by step vcs for technique/safety      Balance Overall balance assessment: Needs assistance Sitting-balance support: Bilateral upper extremity supported, Feet supported Sitting balance-Leahy Scale: Poor Sitting balance - Comments: R lateral lean throughout Postural control: Right lateral lean Standing balance support: Bilateral upper extremity supported, During functional activity, Reliant on assistive device for balance Standing balance-Leahy Scale: Poor                             ADL either performed or assessed with clinical judgement   ADL Overall ADL's : Needs assistance/impaired Eating/Feeding: Sitting;Supervision/ safety   Grooming: Wash/dry face;Minimal assistance           Upper Body Dressing : Maximal  assistance   Lower Body Dressing: Maximal assistance   Toilet Transfer: Moderate assistance;Rolling walker (2 wheels);+2 for safety/equipment Toilet Transfer Details (indicate cue type and reason): poor adherence to weight bearing precautions Toileting- Clothing  Manipulation and Hygiene: Maximal assistance       Functional mobility during ADLs:  (unsafe to attempt on this date due to pt unable to adhere to [facility] precautions)       Vision Patient Visual Report: No change from baseline       Perception     Praxis      Pertinent Vitals/Pain Pain Assessment Pain Assessment: No/denies pain     Hand Dominance     Extremity/Trunk Assessment Upper Extremity Assessment Upper Extremity Assessment: Generalized weakness   Lower Extremity Assessment Lower Extremity Assessment: Generalized weakness LLE Deficits / Details: L hip ORIF on 10/16/22. Was TTWB prior.       Communication Communication Communication: HOH   Cognition Arousal/Alertness: Lethargic Behavior During Therapy: Flat affect Overall Cognitive Status: Impaired/Different from baseline Area of Impairment: Orientation, Attention, Memory, Following commands, Safety/judgement, Awareness, Problem solving                 Orientation Level: Disoriented to, Place, Situation, Time Current Attention Level: Focused Memory: Decreased short-term memory Following Commands: Follows one step commands inconsistently Safety/Judgement: Decreased awareness of safety, Decreased awareness of deficits Awareness: Intellectual Problem Solving: Slow processing, Decreased initiation, Difficulty sequencing, Requires verbal cues, Requires tactile cues       General Comments  L hip staples noted to be removed, some redness around incisiion area    Exercises     Shoulder Instructions      Home Living Family/patient expects to be discharged to:: Private residence Living Arrangements: Other relatives (per chart was found in house without running water, living with brother who recently passed away) Available Help at Discharge: Family Type of Home: House Home Access: Ramped entrance     Home Layout: One level               Home Equipment: Conservation officer, nature (2 wheels);Cane - single  point;Wheelchair - manual   Additional Comments: information taken from chart- chart also reports pt here for placement      Prior Functioning/Environment Prior Level of Function : Needs assist             Mobility Comments: inconsistent reporting- endorses was using RW ADLs Comments: per chart pt required some assit for ADL after discharge in 11/23; prior to that MOD I with ADL        OT Problem List: Decreased strength;Decreased activity tolerance;Impaired balance (sitting and/or standing);Decreased cognition;Decreased safety awareness;Decreased knowledge of use of DME or AE;Decreased knowledge of precautions      OT Treatment/Interventions: Self-care/ADL training;Patient/family education;Therapeutic exercise;Balance training;Energy conservation;Therapeutic activities;DME and/or AE instruction    OT Goals(Current goals can be found in the care plan section) Acute Rehab OT Goals Patient Stated Goal: eat OT Goal Formulation: With patient Time For Goal Achievement: 12/12/22 Potential to Achieve Goals: Good ADL Goals Pt Will Perform Grooming: with supervision;sitting Pt Will Perform Lower Body Dressing: with min assist Pt Will Transfer to Toilet: with min assist Pt Will Perform Toileting - Clothing Manipulation and hygiene: with min assist  OT Frequency: Min 2X/week    Co-evaluation              AM-PAC OT "6 Clicks" Daily Activity     Outcome Measure Help from another person eating meals?: A Little Help from another person  taking care of personal grooming?: A Lot Help from another person toileting, which includes using toliet, bedpan, or urinal?: A Lot Help from another person bathing (including washing, rinsing, drying)?: A Lot Help from another person to put on and taking off regular upper body clothing?: A Lot Help from another person to put on and taking off regular lower body clothing?: A Lot 6 Click Score: 13   End of Session Equipment Utilized During  Treatment: Rolling walker (2 wheels) Nurse Communication: Mobility status  Activity Tolerance: Patient tolerated treatment well Patient left: in chair;with call bell/phone within reach;with family/visitor present;with chair alarm set  OT Visit Diagnosis: Unsteadiness on feet (R26.81);Muscle weakness (generalized) (M62.81)                Time: 5208-0223 OT Time Calculation (min): 28 min Charges:  OT General Charges $OT Visit: 1 Visit OT Evaluation $OT Eval Moderate Complexity: 1 Mod Shanon Payor, OTD OTR/L  11/28/22, 1:07 PM

## 2022-11-28 NOTE — ED Notes (Signed)
Patient became restless pull off his purwick and tugging at cardiac leads. For patient's comfort BP cuff and leads were removed. A new purwick was placed and patient was readjusted in bed.

## 2022-11-28 NOTE — ED Notes (Signed)
Patient's external urinary cath came loose and patient wet his diaper. Patient's urinary cath replaced and new brief placed. Patient repositioned in bed at this time.

## 2022-11-28 NOTE — ED Notes (Signed)
Pt pulled off purewick. New purewick placed, and brief changed.

## 2022-11-28 NOTE — ED Notes (Signed)
While passing meds, pt told this RN that he did not want to be placed into a Nursing Home, he would rather be dead.

## 2022-11-28 NOTE — ED Notes (Signed)
Patient continues to sleep at this time. No apparent distress. Patient on cardiac monitor. Niece is at bedside. Patient/ niece aren't in need of anything at this time. This RN instructed niece to use call bell if anything is needed.

## 2022-11-28 NOTE — ED Provider Notes (Signed)
-----------------------------------------   7:06 AM on 11/28/2022 -----------------------------------------   Blood pressure 111/78, pulse 72, temperature 98.1 F (36.7 C), temperature source Oral, resp. rate (!) 26, SpO2 97 %.  The patient is calm and cooperative at this time.  There have been no acute events since the last update.  Awaiting disposition plan from case management/social work.    Dresden Lozito, Layla Maw, DO 11/28/22 [phone]

## 2022-11-28 NOTE — Progress Notes (Signed)
SNF bed search started. Patient has been denied by 7 facilities at this time.

## 2022-11-28 NOTE — ED Notes (Signed)
Hospital bed provided for pt comfort.

## 2022-11-28 NOTE — Progress Notes (Signed)
No bed offers at this time. Patient has been denied by 9 facilities.

## 2022-11-28 NOTE — ED Notes (Signed)
Pt's niece informed about pt's comment about not wanting to go to a nursing home. At which time the niece became agitated stating that she was the DPOA and that nobody was to speak to the pt without being present.

## 2022-11-28 NOTE — ED Notes (Signed)
Pt awake during RN hourly rounding.  RN notes pt has urinated on bed.  Pt self removed purewick and when asked why pt did that pt states "I don't know."  Pt cleaned, linens changed, male purewick applied.  Pt continues to lay on back even after repositioning.

## 2022-11-29 DIAGNOSIS — E876 Hypokalemia: Secondary | ICD-10-CM | POA: Diagnosis not present

## 2022-11-29 NOTE — ED Notes (Signed)
Pt assisted to sitting position, dinner tray set up within patient reach.

## 2022-11-29 NOTE — ED Notes (Signed)
Resumed care from brandon rn.  Pt sleeping

## 2022-11-29 NOTE — Progress Notes (Signed)
Physical Therapy Treatment Patient Details Name: Zachary Pugh. MRN: [id number] DOB: 17-Feb-1956 Today's Date: 11/29/2022   History of Present Illness Zachary Pugh. is a 66 y.o. male with a history of seizure disorder brought into the ER by family for placement.  Patient's primary care taker just recently died.  The patient's niece and nephew are now helping arrange care for the patient but found him in a house without running water or heat.  He was in a very poor state condition.  APS is involved.  They are currently working on placement.  Poorly has not been taking his medications.  States he has had some falls.  Does have reported history of substance abuse but denies any use recently.    PT Comments    Pt received supine in bed agreeable to supine level exercises. Pt very agitated, cussing in room declining OOB mobility. Education provided on benefits of OOB mobility may assist in his discharge plan from hospital with pt further declining. Therex performed as listed below. Pt with all needs in reach.    Recommendations for follow up therapy are one component of a multi-disciplinary discharge planning process, led by the attending physician.  Recommendations may be updated based on patient status, additional functional criteria and insurance authorization.  Follow Up Recommendations  Skilled nursing-short term rehab (<3 hours/day) Can patient physically be transported by private vehicle: No   Assistance Recommended at Discharge Frequent or constant Supervision/Assistance  Patient can return home with the following A lot of help with walking and/or transfers;A lot of help with bathing/dressing/bathroom;Assist for transportation;Help with stairs or ramp for entrance   Equipment Recommendations  Other (comment)    Recommendations for Other Services       Precautions / Restrictions Precautions Precautions: Fall Precaution Comments: L hip ORIF on 10/16/22. Restrictions Weight  Bearing Restrictions: Yes LLE Weight Bearing: Touchdown weight bearing Other Position/Activity Restrictions: last ortho note TTWB;     Mobility  Bed Mobility                    Transfers                        Ambulation/Gait                   Stairs             Wheelchair Mobility    Modified Rankin (Stroke Patients Only)       Balance                                            Cognition Arousal/Alertness: Lethargic Behavior During Therapy: Agitated, Flat affect Overall Cognitive Status: Impaired/Different from baseline                                          Exercises General Exercises - Lower Extremity Ankle Circles/Pumps: AROM, Strengthening, Both, 10 reps, Supine Short Arc Quad: AROM, Strengthening, Both, 10 reps, Supine Heel Slides: AROM, Strengthening, Both, 10 reps, Supine Hip ABduction/ADduction: AAROM, Strengthening, Both, 10 reps, Supine Straight Leg Raises: AROM, Strengthening, Both, 10 reps, Supine Other Exercises Other Exercises: encouraged OOB mobility to improve LE strength and mobility. How improving mobility may assist placement out of  hospital.    General Comments        Pertinent Vitals/Pain Pain Assessment Pain Assessment: No/denies pain    Home Living                          Prior Function            PT Goals (current goals can now be found in the care plan section) Acute Rehab PT Goals Patient Stated Goal: find placement PT Goal Formulation: With family Time For Goal Achievement: 12/11/22 Potential to Achieve Goals: Poor Progress towards PT goals: Not progressing toward goals - comment    Frequency    Min 2X/week      PT Plan Current plan remains appropriate    Co-evaluation              AM-PAC PT "6 Clicks" Mobility   Outcome Measure  Help needed turning from your back to your side while in a flat bed without using bedrails?: A  Lot Help needed moving from lying on your back to sitting on the side of a flat bed without using bedrails?: A Lot Help needed moving to and from a bed to a chair (including a wheelchair)?: Total Help needed standing up from a chair using your arms (e.g., wheelchair or bedside chair)?: Total Help needed to walk in hospital room?: Total Help needed climbing 3-5 steps with a railing? : Total 6 Click Score: 8    End of Session   Activity Tolerance: Treatment limited secondary to agitation Patient left: in bed;with call bell/phone within reach;with bed alarm set Nurse Communication: Mobility status PT Visit Diagnosis: Unsteadiness on feet (R26.81);Muscle weakness (generalized) (M62.81);History of falling (Z91.81);Difficulty in walking, not elsewhere classified (R26.2)     Time: 1120-1130 PT Time Calculation (min) (ACUTE ONLY): 10 min  Charges:  $Therapeutic Exercise: 8-22 mins                     Zachary Pugh M. Fairly IV, PT, DPT Physical Therapist- [place]  [facility]  11/29/2022, 11:41 AM

## 2022-11-29 NOTE — ED Provider Notes (Signed)
-----------------------------------------   5:15 AM on 11/29/2022 -----------------------------------------   Blood pressure 114/80, pulse (!) 56, temperature 98.6 F (37 C), temperature source Oral, resp. rate 18, SpO2 94 %.  The patient is calm and cooperative at this time.  There have been no acute events since the last update.  Awaiting disposition plan from Social Work team.   Irean Hong, MD 11/29/22 [phone]

## 2022-11-29 NOTE — ED Notes (Signed)
Pt bed linens changed and patient bathed. New primofit placed. Pt placed in scrub top per family request.

## 2022-11-29 NOTE — ED Notes (Signed)
Pt agitated this morning. Denied his breakfast twice when offered. Pt eventually agreeable to take morning medications.

## 2022-11-29 NOTE — ED Notes (Signed)
Pt incontinent of urine, pt removed primofit. Bed linens changed, pericare performed.

## 2022-11-29 NOTE — ED Notes (Signed)
Pt resting comfortably at this time.

## 2022-11-30 ENCOUNTER — Emergency Department: Payer: Medicare HMO

## 2022-11-30 DIAGNOSIS — S72002A Fracture of unspecified part of neck of left femur, initial encounter for closed fracture: Secondary | ICD-10-CM | POA: Diagnosis not present

## 2022-11-30 DIAGNOSIS — E876 Hypokalemia: Secondary | ICD-10-CM | POA: Diagnosis not present

## 2022-11-30 MED ORDER — HYDROCODONE-ACETAMINOPHEN 5-325 MG PO TABS
1.0000 | ORAL_TABLET | Freq: Once | ORAL | Status: AC
  Administered 2022-11-30: 1 via ORAL
  Filled 2022-11-30: qty 1

## 2022-11-30 NOTE — Consult Note (Signed)
Contacted by Dr. Roxan Hockey regarding Zachary Pugh, a homeless patient with history of EtOH and drug abuse who underwent percutaneous fixation of a non-displaced left femoral neck hip fracture by me on 10/16/22. Patient could not be placed in a SNF at discharge due to his substance abuse history.  I was asked by Dr. Roxan Hockey and PT staff to look at [facility] and determine the patient's current WB status. He was discharged from the hospital with instructions for TTWB for 6 weeks post-op.  Patient never followed up in my office post-op, but was brought into the [facility] ER on 11/26/22 by family members. I have reviewed the patient's left hip xrays. The xrays demonstrate compression at the fracture site compared to his immediate post-op films indicating he likely has been walking on his left hip.  At this point he is approximately six weeks out from surgery and he can continue to [facility] on the left lower extremity as PT notes in the ER state the patient can't maintain TTWB with them in the ER.  It is possible the patient's fracture may displace and he may eventually need a hemiarthroplasty. I will see him in the office in 4 weeks and recheck the fracture with an xray. Ideally, the patient should continue PT for left hip ROM, lower extremity strengthening and gait training, but given the fact that he is homeless, I am not sure if this will be possible. Will defer to case management regarding setting up services after discharge.

## 2022-11-30 NOTE — ED Provider Notes (Signed)
-----------------------------------------   2:35 PM on 11/30/2022 -----------------------------------------   Blood pressure 125/74, pulse 63, temperature 99.1 F (37.3 C), temperature source Oral, resp. rate 18, SpO2 96 %.  The patient is calm and cooperative at this time.  There have been no acute events since the last update.  Awaiting disposition plan from Behavioral Medicine and/or Social Work team(s).   PT requested orthopedic reevaluation due to the patient having difficulty with weightbearing on the left hip.  I obtained repeat x-rays and consulted Dr. Martha Clan from orthopedics.  He does not have any new recommendations except that the patient may continue to weight-bear as tolerated and should continue PT for range of motion of the hip.   Dionne Bucy, MD 11/30/22 1436

## 2022-11-30 NOTE — TOC Progression Note (Signed)
Transition of Care [facility]) - Progression Note    Patient Details  Name: Zachary Pugh. MRN: [id number] Date of Birth: 1956-03-04  Transition of Care [facility]) CM/SW Contact  Allayne Butcher, RN Phone Number: 11/30/2022, 10:09 AM  Clinical Narrative:    No bed offers received yet.  RNCM reached out to Shaun from [facility], he will come and do an assessment on patient today to see if they have any facilities that can offer a bed.          Expected Discharge Plan and Services                                                 Social Determinants of Health (SDOH) Interventions    Readmission Risk Interventions    10/25/2022    4:07 PM  Readmission Risk Prevention Plan  Transportation Screening Complete  PCP or Specialist Appt within 3-5 Days Complete  HRI or Home Care Consult Complete  Palliative Care Screening Not Applicable  Medication Review (RN Care Manager) Referral to Pharmacy

## 2022-11-30 NOTE — ED Notes (Signed)
This RN to bedside with Lillia Abed, Charity fundraiser. Pt. Cleansed, linens and gown changed. Pt. Repositioned for comfort. Medium sized soft BM cleaned up. Pericare provided, mepilex reapplied to pt's coccyx as there was redness and some minor skin breakdown in two places in sacral area. Pt. Refused purewick, brief applied. Warm blankets applied and lights dimmed per pt. Request. Pt. Denies any further need at this time.

## 2022-11-30 NOTE — ED Notes (Addendum)
This RN to bedside for pt. Groaning. Pt. Leaning on elbow, without any clothing/gown on, laid back in bed attempting to open apple sauce. Pt. Yells in frustration, this RN offers to help pt. Sit up, and open apple sauce. Pt's breakfast sitting at bedside. This RN sat pt. Up in bed, provides new gown, unwrapped/arranged pt's breakfast to facilitate consumption. Pt. Denies any further need. This RN notifies pt. That she will be back later to change pt's linens and get him cleaned up. Pt. Is agreeable.

## 2022-11-30 NOTE — ED Notes (Signed)
This RN to bedside with Lillia Abed, Charity fundraiser. Pt. Cleansed, linens and gown changed. Pt. Repositioned for comfort. Pericare provided, mepilex applied to pt's coccyx as there was redness and some minor skin breakdown in two places in sacral area. Pt. Refused purewick, brief applied. Warm blankets applied and lights dimmed per pt. Request. Pt. Denies any further need at this time.

## 2022-12-01 DIAGNOSIS — E876 Hypokalemia: Secondary | ICD-10-CM | POA: Diagnosis not present

## 2022-12-01 NOTE — ED Notes (Signed)
Patient had large BM. Complete bed change with patient cleaned and placed in clean gown/brief. No needs expressed at this time. Call light within reach. Bed locked and lowered.

## 2022-12-01 NOTE — Progress Notes (Signed)
PT Cancellation Note  Patient Details Name: Zachary Pugh. MRN: [id number] DOB: 1956/09/10   Cancelled Treatment:     Pt refused PT, stating he could not tolerate any activity. Continue PT per POC next available date/time.   Jannet Askew 12/01/2022, 3:28 PM

## 2022-12-01 NOTE — ED Provider Notes (Signed)
-----------------------------------------   9:14 AM on 12/01/2022 -----------------------------------------   Blood pressure 104/67, pulse 68, temperature (!) 97.5 F (36.4 C), temperature source Axillary, resp. rate 18, SpO2 94 %.  The patient is calm and cooperative at this time.  There have been no acute events since the last update.  Awaiting disposition plan from case management/social work.    Corena Herter, MD 12/01/22 [phone]

## 2022-12-01 NOTE — TOC Progression Note (Signed)
Transition of Care [facility]) - Progression Note    Patient Details  Name: Zachary Pugh. MRN: [id number] Date of Birth: [birth year]  [facility]) CM/SW Contact  Allayne Butcher, RN Phone Number: 12/01/2022, 12:25 PM  Clinical Narrative:    Reached out to Nancy Fetter with [facility] [employer] to see if she could get Medicaid application started.  Also reached out to Alta with [facility] financial counseling to see if she maybe able to start the application for LTC Medicaid.          Expected Discharge Plan and Services                                               Social Determinants of Health (SDOH) Interventions SDOH Screenings   Food Insecurity: No Food Insecurity (10/16/2022)  Housing: Low Risk  (10/16/2022)  Transportation Needs: No Transportation Needs (10/16/2022)  Utilities: Not At Risk (10/16/2022)  Tobacco Use: High Risk (10/18/2022)    Readmission Risk Interventions    10/25/2022    4:07 PM  Readmission Risk Prevention Plan  Transportation Screening Complete  PCP or Specialist Appt within 3-5 Days Complete  HRI or Home Care Consult Complete  Palliative Care Screening Not Applicable  Medication Review (RN Care Manager) Referral to Pharmacy

## 2022-12-01 NOTE — TOC Progression Note (Signed)
Transition of Care [facility]) - Progression Note    Patient Details  Name: Zachary Pugh. MRN: [id number] Date of Birth: 1956-11-30  [facility]) CM/SW Contact  Allayne Butcher, RN Phone Number: 12/01/2022, 12:20 PM  Clinical Narrative:    [facility] can offer a bed but they need a LTC Medicaid application started and a pending number first since patient will end up being long term care.          Expected Discharge Plan and Services                                               Social Determinants of Health (SDOH) Interventions SDOH Screenings   Food Insecurity: No Food Insecurity (10/16/2022)  Housing: Low Risk  (10/16/2022)  Transportation Needs: No Transportation Needs (10/16/2022)  Utilities: Not At Risk (10/16/2022)  Tobacco Use: High Risk (10/18/2022)    Readmission Risk Interventions    10/25/2022    4:07 PM  Readmission Risk Prevention Plan  Transportation Screening Complete  PCP or Specialist Appt within 3-5 Days Complete  HRI or Home Care Consult Complete  Palliative Care Screening Not Applicable  Medication Review (RN Care Manager) Referral to Pharmacy

## 2022-12-01 NOTE — ED Notes (Signed)
Patient resting with eyes closed. Respirations even and unlabored with NAD noted at this time.

## 2022-12-01 NOTE — ED Notes (Signed)
Patient had large BM all over self and bed. Patient cleaned well and bed cleaned with fresh liens applied. Patient placed in clean gown and brief. Appears to be resting comfortably in bed with bed alarm on at this time. No needs expressed.

## 2022-12-01 NOTE — Consult Note (Signed)
ORTHOPAEDIC CONSULTATION  REQUESTING PHYSICIAN: No att. providers found  Chief Complaint: s/p left hip percutaneous screw fixation of femoral neck fracture  HPI: Zachary Pugh. is a 66 y.o. homeless male with a history of drug and alcohol abuse who presented to ER on 11/26/22.  He underwent percutaneous fixation of a non-displaced left femoral neck hip fracture by me on 10/16/22. Patient could not be placed in a SNF at discharge due to his substance abuse history.  Patient never followed up in my office post-op, but was brought into the [facility] ER on 11/26/22 by family members. Xrays from ER demonstrate compression at the fracture site compared to his immediate post-op films indicating he likely has been walking on his left hip. Today I saw him in the ER.  Patient was non-verbal but followed commands.  Past Surgical History:  Procedure Laterality Date   HIP PINNING,CANNULATED Left 10/16/2022   Procedure: PERCUTANEOUS FIXATION OF FEMORAL NECK;  Surgeon: [name], MD;  Location: [facility] ORS;  Service: Orthopedics;  Laterality: Left;   Social History   Socioeconomic History   Marital status: Single    Spouse name: Not on file   Number of children: Not on file   Years of education: Not on file   Highest education level: Not on file  Occupational History   Not on file  Tobacco Use   Smoking status: Every Day    Packs/day: 1.00    Types: Cigarettes   Smokeless tobacco: Never  Vaping Use   Vaping Use: Never used  Substance and Sexual Activity   Alcohol use: Yes    Comment: 2-3 beers, 2-3 times a week   Drug use: Never   Sexual activity: Not Currently  Other Topics Concern   Not on file  Social History Narrative   Not on file   Social Determinants of Health   Financial Resource Strain: Not on file  Food Insecurity: No Food Insecurity (10/16/2022)   Hunger Vital Sign    Worried About Running Out of Food in the Last Year: Never true    Ran Out of Food in the Last Year:  Never true  Transportation Needs: No Transportation Needs (10/16/2022)   PRAPARE - Administrator, Civil Service (Medical): No    Lack of Transportation (Non-Medical): No  Physical Activity: Not on file  Stress: Not on file  Social Connections: Not on file   No family history on file. No Known Allergies Prior to Admission medications   Medication Sig Start Date End Date Taking? Authorizing Provider  docusate sodium (COLACE) 100 MG capsule Take 1 capsule (100 mg total) by mouth 2 (two) times daily as needed for mild constipation or moderate constipation. Patient not taking: Reported on 11/26/2022 10/26/22   Dimple Nanas, MD  HYDROcodone-acetaminophen (NORCO/VICODIN) 5-325 MG tablet Take 1-2 tablets by mouth every 4 (four) hours as needed for moderate pain (pain score 4-6). Patient not taking: Reported on 11/26/2022 10/26/22   Dimple Nanas, MD  levETIRAcetam (KEPPRA) 500 MG tablet Take 1 tablet (500 mg total) by mouth 2 (two) times daily. Patient not taking: Reported on 10/16/2022 09/27/22   Jene Every, MD  pantoprazole (PROTONIX) 40 MG tablet Take 1 tablet (40 mg total) by mouth daily before breakfast. 10/26/22 11/25/22  Amin, Loura Halt, MD  polyethylene glycol (MIRALAX / GLYCOLAX) 17 g packet Take 17 g by mouth daily as needed for severe constipation. Patient not taking: Reported on 11/26/2022 10/26/22   Stephania Fragmin Chirag,  MD   DG Hip Unilat W or Wo Pelvis 2-3 Views Left  Result Date: 11/30/2022 CLINICAL DATA:  Worsening hip pain after recent surgery EXAM: DG HIP (WITH OR WITHOUT PELVIS) 2-3V LEFT COMPARISON:  10/16/2022 FINDINGS: Frontal view of the pelvis as well as frontal and frogleg lateral views of the left hip are obtained. There are 4 cannulated screws traversing a prior left femoral neck fracture. No evidence of metallic hardware failure or loosening. Increased impaction at the femoral neck fracture site since prior study. No significant callus  formation. Stable mild bilateral hip osteoarthritis. The remainder of the bony pelvis is unremarkable. IMPRESSION: 1. Previous pinning of a left femoral neck fracture, with increased impaction at the fracture site since prior study. No change in alignment. 2. No other bony abnormalities.  Stable osteoarthritis. Electronically Signed   By: Randa Ngo M.D.   On: 11/30/2022 11:31    Positive ROS: All other systems have been reviewed and were otherwise negative with the exception of those mentioned in the HPI and as above.  Physical Exam: General: Sleeping but arousable, no acute distress  MUSCULOSKELETAL: Left hip:  Incision well healed.  Staples have been removed.  Patient can flex the hip and knee approximately 90 degrees without pain.  He had no significant pain with passive IR and ER.  Distally her is NVI and can actively dorsiflex and plantarflex  his ankle.    Assessment: Patient stable post-op  Plan:  At this point the patient is approximately six weeks out from surgery and he can continue to [facility] on the left lower extremity as PT notes in the ER state the patient can't maintain TTWB with them in the ER.  It is possible the patient's fracture may displace and he may eventually need a hemiarthroplasty. I will see him in the office in 4 weeks and recheck the fracture with an xray. Ideally, the patient should continue PT for left hip ROM, lower extremity strengthening and gait training, but given the fact that he is homeless, I am not sure if this will be possible. Will defer to case management regarding setting up services after discharge.     Thornton Park, MD    12/01/2022 5:10 PM

## 2022-12-01 NOTE — ED Notes (Signed)
Patient resting in bed with eyes closed. Respirations even, unlabored on RA. Appears sleeping. Bed in low position with call light in reach and siderails up. No distress noted at this time.

## 2022-12-01 NOTE — ED Notes (Signed)
Pt found yelling and had undressed self. Found to have soiled the bed with urine. Bed bath provided along with bed sheets, bed pads, and new blankets. Pt also placed on male purfit. Pt was repositioned in bed. States wanting to lay on his back. Placed pressure dressing to buttock d/t blanching red bottom. Call light with in reach. Reinforced importance and use of call bell.

## 2022-12-01 NOTE — ED Notes (Signed)
Niece April called secretary x2 requesting update. Allowed Mr. Brodrick to call family to give update. Pt was initially agreeable. Niece called while in pt's room with RN at bedside. While awaiting Niece to pick up, pt states he does not want to speak with family. Pt does not give RN permission to update family. No return phone call made by this RN per pt request.

## 2022-12-01 NOTE — ED Notes (Addendum)
Pt cleaned up, linens changed, depend put on. Vitals stable. Room wiped down. Pt comfortable watching television.

## 2022-12-01 NOTE — ED Notes (Signed)
Pt A&Ox4. Vitals stable. Took morning meds with no complications.

## 2022-12-02 NOTE — ED Notes (Signed)
Pt noted to have taken off gown during hourly rounded. RN attempted to get pt recovered noted bed was wet and pt had also removed male purfit. Pericare was provided to pt. Pt was provided with new bed pads. New gown was provided. Pt was readjusted. Pt states wishes to go back to sleep. Call bell, water, and tv remote within reach. Bed lowest position. Pt denies any additional needs.

## 2022-12-02 NOTE — ED Notes (Signed)
Pt given breakfast tray

## 2022-12-02 NOTE — ED Notes (Signed)
This RN at bedside. Pt's hands covered in feces. This RN asking pt if he knows when he has to have a BM and pt reports yes. This RN instructing pt to hit call bell next time to prevent delay of care. Pt cleaned and full linen change performed by this RN and EDT.

## 2022-12-02 NOTE — ED Notes (Signed)
Patient assisted back into gown which pt had removed. Pt positioned in bed for comfort. Bed linens and brief clean and dry. Patient provided with 2 additional warm blankets per request. Pt now resting in bed watching tv. Bed low and locked with side rails raised x3. Call bell in reach and bed alarm in use. Pt noted to be breathing unlabored speaking in full sentences with symmetric chest rise and fall.

## 2022-12-02 NOTE — ED Notes (Signed)
Pt yelling. When coming to bedside pt found in bed. When asked why he was yelling out, pt states "I dont know." Pt asked if he needed anything. Pt denies any additional need. Call bell remains at bedside

## 2022-12-02 NOTE — ED Notes (Signed)
MD at bedside for post fall assessment

## 2022-12-02 NOTE — Progress Notes (Signed)
Physical Therapy Treatment Patient Details Name: Zachary Pugh. MRN: [id number] DOB: 1956-11-25 Today's Date: 12/02/2022   History of Present Illness Zachary Pugh. is a 66 y.o. male with a history of seizure disorder brought into the ER by family for placement.  Patient's primary care taker just recently died.  The patient's niece and nephew are now helping arrange care for the patient but found him in a house without running water or heat.  He was in a very poor state condition.  APS is involved.  They are currently working on placement.  Poorly has not been taking his medications.  States he has had some falls.  Does have reported history of substance abuse but denies any use recently.    PT Comments     Pt was side lying in bed upon arriving. He is alert but disoriented. Only oriented to self. Was able to follow commands with increased time and repetition of desired task requested of him. Pt is soiled from urination. Author assisted with hygiene care and linen change. He was able to exit bed, stand, and ambulate with RW. Does not endorse pain however demonstrates very antalgic step to gait pattern. CGA for all mobility, transfers, and gait for safety. Returned pt to bed and re-set alarm. Pt will need 24/7 assistance and supervision at DC.    Recommendations for follow up therapy are one component of a multi-disciplinary discharge planning process, led by the attending physician.  Recommendations may be updated based on patient status, additional functional criteria and insurance authorization.  Follow Up Recommendations  Skilled nursing-short term rehab (<3 hours/day)     Assistance Recommended at Discharge Frequent or constant Supervision/Assistance  Patient can return home with the following A little help with walking and/or transfers;A little help with bathing/dressing/bathroom;Assistance with cooking/housework;Assistance with feeding;Direct supervision/assist for medications  management;Direct supervision/assist for financial management;Assist for transportation;Help with stairs or ramp for entrance   Equipment Recommendations  Rolling walker (2 wheels)       Precautions / Restrictions Precautions Precautions: Fall Precaution Comments: L hip ORIF on 10/16/22. Restrictions Weight Bearing Restrictions: Yes LLE Weight Bearing: Touchdown weight bearing (per DR K note WBAT and follow up in 4 weeks for new x rays)     Mobility  Bed Mobility Overal bed mobility: Needs Assistance Bed Mobility: Supine to Sit, Sit to Supine  Supine to sit: Min guard Sit to supine: Min guard   General bed mobility comments: CGA for TCs and for safety    Transfers Overall transfer level: Needs assistance Equipment used: Rolling walker (2 wheels) Transfers: Sit to/from Stand Sit to Stand: Min guard   Ambulation/Gait Ambulation/Gait assistance: Min guard Gait Distance (Feet): 30 Feet Assistive device: Rolling walker (2 wheels) Gait Pattern/deviations: Step-to pattern, Antalgic Gait velocity: decreased     General Gait Details: pt was able to ambulate with very antalgic step to gait pattern. does not c/o pain but by looks of gauit sequencing, pain is apparent   Balance Overall balance assessment: Needs assistance Sitting-balance support: Bilateral upper extremity supported, Feet supported Sitting balance-Leahy Scale: Poor     Standing balance support: Bilateral upper extremity supported, During functional activity, Reliant on assistive device for balance Standing balance-Leahy Scale: Poor     Cognition Arousal/Alertness: Awake/alert Behavior During Therapy: Flat affect Overall Cognitive Status: History of cognitive impairments - at baseline    Following Commands: Follows one step commands inconsistently Safety/Judgement: Decreased awareness of safety, Decreased awareness of deficits Awareness: Intellectual Problem Solving: Slow  processing, Decreased initiation,  Difficulty sequencing, Requires verbal cues, Requires tactile cues General Comments: pt is alert but only oriented to self           General Comments General comments (skin integrity, edema, etc.): Pt was soiled/soaked with urine. Chartered loss adjuster assisted with hygiene care      Pertinent Vitals/Pain Pain Assessment Pain Assessment: No/denies pain     PT Goals (current goals can now be found in the care plan section) Acute Rehab PT Goals Patient Stated Goal: none stated Progress towards PT goals: Not progressing toward goals - comment    Frequency    Min 2X/week      PT Plan Current plan remains appropriate       AM-PAC PT "6 Clicks" Mobility   Outcome Measure  Help needed turning from your back to your side while in a flat bed without using bedrails?: A Little Help needed moving from lying on your back to sitting on the side of a flat bed without using bedrails?: A Little Help needed moving to and from a bed to a chair (including a wheelchair)?: A Little Help needed standing up from a chair using your arms (e.g., wheelchair or bedside chair)?: A Little Help needed to walk in hospital room?: A Little Help needed climbing 3-5 steps with a railing? : A Lot 6 Click Score: 17    End of Session   Activity Tolerance: Patient tolerated treatment well;Other (comment) (limited by urination/ incontinence) Patient left: in bed;with call bell/phone within reach;with bed alarm set Nurse Communication: Mobility status PT Visit Diagnosis: Unsteadiness on feet (R26.81);Muscle weakness (generalized) (M62.81);History of falling (Z91.81);Difficulty in walking, not elsewhere classified (R26.2)     Time: 0300-9233 PT Time Calculation (min) (ACUTE ONLY): 16 min  Charges:  $Gait Training: 8-22 mins                     Jetta Lout PTA 12/02/22, 5:22 PM

## 2022-12-02 NOTE — TOC Progression Note (Signed)
Transition of Care [facility]) - Progression Note    Patient Details  Name: Zachary Pugh. MRN: [id number] Date of Birth: [birth year]  Transition of Care [facility]) CM/SW Contact  Allayne Butcher, RN Phone Number: 12/02/2022, 4:41 PM  Clinical Narrative:     Patient refused to do the Medicaid app with Financial counselor this morning.         Expected Discharge Plan and Services                                               Social Determinants of Health (SDOH) Interventions SDOH Screenings   Food Insecurity: No Food Insecurity (10/16/2022)  Housing: Low Risk  (10/16/2022)  Transportation Needs: No Transportation Needs (10/16/2022)  Utilities: Not At Risk (10/16/2022)  Tobacco Use: High Risk (10/18/2022)    Readmission Risk Interventions    10/25/2022    4:07 PM  Readmission Risk Prevention Plan  Transportation Screening Complete  PCP or Specialist Appt within 3-5 Days Complete  HRI or Home Care Consult Complete  Palliative Care Screening Not Applicable  Medication Review (RN Care Manager) Referral to Pharmacy

## 2022-12-02 NOTE — ED Notes (Addendum)
EDT and RN at bedside due to bed alarm alerting. EDT and RN entered room to find pt kneeling on the floor at end of bed grasping foot of bed. Pt denies head trauma. Pt has skin tear to right forearm. Dressing applied to skin tear. Pt mentation is still baseline. MD made aware. Safety zone filed

## 2022-12-03 ENCOUNTER — Emergency Department: Payer: Medicare HMO

## 2022-12-03 DIAGNOSIS — S72002A Fracture of unspecified part of neck of left femur, initial encounter for closed fracture: Secondary | ICD-10-CM | POA: Diagnosis not present

## 2022-12-03 NOTE — ED Notes (Signed)
RN contacted by niece April Torres. Niece requested that all placement conversations with pt involve her via telephone at [phone] (cell) or [phone] (work).

## 2022-12-03 NOTE — Progress Notes (Signed)
Occupational Therapy Treatment Patient Details Name: Zachary Pugh. MRN: [id number] DOB: [birth year] Today's Date: 12/03/2022   History of present illness Zachary Pugh. is a 66 y.o. male with a history of seizure disorder brought into the ER by family for placement.  Patient's primary care taker just recently died.  The patient's niece and nephew are now helping arrange care for the patient but found him in a house without running water or heat.  He was in a very poor state condition.  APS is involved.  They are currently working on placement.  Poorly has not been taking his medications.  States he has had some falls.  Does have reported history of substance abuse but denies any use recently.   OT comments  Chart reviewed, pt greeted in bed agreeable to OT tx session. Improvements noted in one step direction following however pt continues tp present with deficits in safety awareness requiring frequent-step by step vcs for safety throughout. Tx session targeted improving functional activity tolerance to improve ADL independence. Pt amb in room approx 40' with RW with 2 seated rest breaks, MAX A for peri care as pt is incontinent of urine prior to mobility. MAX A required for LB dressing, limited by cognition as pt attempts to put socks on hands prior to vcs provided by this therapist. Pt is making progress towards goals, significantly improved mobility on this date, however pt would continue to benefit from skilled OT to address functional deficits and to continue to improve ADL performance.    Recommendations for follow up therapy are one component of a multi-disciplinary discharge planning process, led by the attending physician.  Recommendations may be updated based on patient status, additional functional criteria and insurance authorization.    Follow Up Recommendations  Skilled nursing-short term rehab (<3 hours/day)     Assistance Recommended at Discharge Frequent or constant  Supervision/Assistance  Patient can return home with the following  A lot of help with walking and/or transfers;A lot of help with bathing/dressing/bathroom   Equipment Recommendations  Other (comment) (per next venue of care)    Recommendations for Other Services      Precautions / Restrictions Precautions Precautions: Fall Restrictions Weight Bearing Restrictions: Yes LLE Weight Bearing: Weight bearing as tolerated Other Position/Activity Restrictions: per Dr Kirtland Bouchard note ok for WBAT       Mobility Bed Mobility Overal bed mobility: Needs Assistance Bed Mobility: Supine to Sit, Sit to Supine     Supine to sit: Supervision Sit to supine: Supervision        Transfers Overall transfer level: Needs assistance Equipment used: Rolling walker (2 wheels) Transfers: Sit to/from Stand Sit to Stand: Min guard                 Balance Overall balance assessment: Needs assistance Sitting-balance support: Bilateral upper extremity supported, Feet supported Sitting balance-Leahy Scale: Fair     Standing balance support: Bilateral upper extremity supported, During functional activity, Reliant on assistive device for balance Standing balance-Leahy Scale: Poor                             ADL either performed or assessed with clinical judgement   ADL Overall ADL's : Needs assistance/impaired Eating/Feeding: Supervision/ safety;Sitting   Grooming: Wash/dry hands;Wash/dry face;Supervision/safety Grooming Details (indicate cue type and reason): vcs for sequencing             Lower Body Dressing: Maximal assistance Lower  Body Dressing Details (indicate cue type and reason): pt put socks on his hands first when given socks, stated "I dont know what I am doing" Toilet Transfer: Min guard;Rolling walker (2 wheels);Ambulation;Cueing for safety   Toileting- Clothing Manipulation and Hygiene: Maximal assistance;Sit to/from stand Toileting - Clothing Manipulation  Details (indicate cue type and reason): incontient in brief, pt reports he knows when he has to urinate however was unaware that he did so     Functional mobility during ADLs: Min guard;Rolling walker (2 wheels) (approx 40' in room with 2 seated rest breaks; step by step vcs for safety, sequencing, approrpiate use of RW)      Extremity/Trunk Assessment              Vision       Perception     Praxis      Cognition Arousal/Alertness: Awake/alert Behavior During Therapy: WFL for tasks assessed/performed Overall Cognitive Status: No family/caregiver present to determine baseline cognitive functioning Area of Impairment: Orientation, Attention, Memory, Following commands, Safety/judgement, Awareness, Problem solving                 Orientation Level: Disoriented to, Time, Situation Current Attention Level: Focused Memory: Decreased short-term memory Following Commands: Follows one step commands with increased time Safety/Judgement: Decreased awareness of safety, Decreased awareness of deficits Awareness: Intellectual Problem Solving: Slow processing, Decreased initiation, Difficulty sequencing, Requires verbal cues, Requires tactile cues          Exercises      Shoulder Instructions       General Comments      Pertinent Vitals/ Pain       Pain Assessment Pain Assessment: No/denies pain  Home Living                                          Prior Functioning/Environment              Frequency  Min 2X/week        Progress Toward Goals  OT Goals(current goals can now be found in the care plan section)  Progress towards OT goals: Progressing toward goals     Plan Discharge plan remains appropriate    Co-evaluation                 AM-PAC OT "6 Clicks" Daily Activity     Outcome Measure   Help from another person eating meals?: A Little Help from another person taking care of personal grooming?: A Little Help from  another person toileting, which includes using toliet, bedpan, or urinal?: A Lot Help from another person bathing (including washing, rinsing, drying)?: A Lot Help from another person to put on and taking off regular upper body clothing?: A Little Help from another person to put on and taking off regular lower body clothing?: A Lot 6 Click Score: 15    End of Session Equipment Utilized During Treatment: Rolling walker (2 wheels)  OT Visit Diagnosis: Unsteadiness on feet (R26.81);Muscle weakness (generalized) (M62.81)   Activity Tolerance Patient tolerated treatment well   Patient Left in bed;with call bell/phone within reach;with bed alarm set   Nurse Communication Mobility status        Time: 3154-0086 OT Time Calculation (min): 24 min  Charges: OT General Charges $OT Visit: 1 Visit OT Treatments $Self Care/Home Management : 8-22 mins $Therapeutic Activity: 8-22 mins  Oleta Mouse, OTD OTR/L  12/03/22, 2:48 PM

## 2022-12-03 NOTE — ED Notes (Signed)
Pt stood up outside of room door setting off bed alarm. RN and NT assisted pt back to bed, provided education on the importance of calling before getting up, warm blankets/toileting offered.

## 2022-12-03 NOTE — ED Notes (Signed)
Patient screaming out profanities. This Clinical research associate went to check on patient.  Patient had explosive diarrhea on bed and in floor. Patient had removed his incontinent brief and threw into the floor along with bed linens and had BM on bed and self.  This Clinical research associate along with Consulting civil engineer and Nurse, children's cleaned patient and placed merplex bandage on patient and made bed with clean linens.

## 2022-12-03 NOTE — Progress Notes (Signed)
Informed by Jetta Lout, PT this AM that patient sustained a fall out of bed yesterday.  I ordered an xray of the left hip.  I have reviewed the xrays and there is no significant change from xrays on 11/30/22.  The is no fracture displacement.  Hardware remains intact.  Patient may continue with PT and OT.  Patient awaiting placement.

## 2022-12-03 NOTE — ED Notes (Signed)
The pt was sent a box meal breakfast. The pt does have a diet order in for a thin breakfast tray. Dietary was called and they advised they would send up a tray.

## 2022-12-03 NOTE — ED Notes (Signed)
Patient resting in bed free from sign of distress. Breathing unlabored  with symmetric chest rise and fall. Bed low and locked with side rails raised x3. Call bell in reach and bed alarm in use.

## 2022-12-03 NOTE — ED Notes (Signed)
Pt resting comfortably, chest rise and fall visualized.

## 2022-12-03 NOTE — ED Notes (Addendum)
66 yom lying supine in bed with his head elevated sleeping.

## 2022-12-03 NOTE — ED Notes (Signed)
The pt's depend and bed linen were changed.

## 2022-12-03 NOTE — ED Notes (Signed)
Patient noted to be incontinent of bowel and bladder. Pt cleansed and clean brief, gown, and linens applied to pt and bed. Pt positioned back in bed for comfort. Pt now resting in bed free from sign of distress and denies further needs at this time. Patient is breathing unlabored speaking in full sentences with symmetric chest rise and fall. Bed low and locked with side rails raised x3. Call bell in reach and bed alarm in use.

## 2022-12-04 DIAGNOSIS — E876 Hypokalemia: Secondary | ICD-10-CM | POA: Diagnosis not present

## 2022-12-04 NOTE — ED Notes (Signed)
Pt incontinent of urine. Bed linens changed.

## 2022-12-04 NOTE — ED Notes (Signed)
Pt wet bed so was moved from bed to recliner with assistance of walker. Linens were changed, chuck pads were placed. When getting up from recliner to get back in bed, pt had diarrhea on floor. Pt was taken to toilet to finish having bowel movement. Pt cleaned and taken back to bed.

## 2022-12-04 NOTE — ED Notes (Addendum)
Pt had another episode of water stool, smith MD notified. Pt cleaned up and repositioned in bed

## 2022-12-04 NOTE — ED Provider Notes (Signed)
-----------------------------------------   7:54 AM on 12/04/2022 -----------------------------------------   Blood pressure 131/86, pulse 72, temperature 97.9 F (36.6 C), temperature source Oral, resp. rate 16, SpO2 99 %.  The patient is calm and cooperative at this time.  There have been no acute events since the last update.  Awaiting disposition plan from [facility] team.   Loleta Rose, MD 12/04/22 [phone]

## 2022-12-04 NOTE — ED Notes (Signed)
Lunch tray delivered to pt. Waste removed appropriately.

## 2022-12-04 NOTE — ED Notes (Signed)
Pt assisted to recliner. Pt picked up urinal and attempted to put it to his mouth as though to drink. Urinal removed from reach by this RN. Bed linens changed and clean gown placed on pt. Pt assisted back to bed. Clean blankets provided.

## 2022-12-05 DIAGNOSIS — E876 Hypokalemia: Secondary | ICD-10-CM | POA: Diagnosis not present

## 2022-12-05 NOTE — Progress Notes (Signed)
PT Cancellation Note  Patient Details Name: Zachary Pugh. MRN: [id number] DOB: 30-Sep-1956   Cancelled Treatment:    Reason Eval/Treat Not Completed: Other (comment)  Attempted x 3..Eating, daughter requesting to return later as she is clipping his nails, and on 3rd attempt pt yelling out upon entering room.  When asked if he was ready for PT session "The last thing I wanna do is walk!, It ain't happening!"  Offered up in chair and he continued to refuse.  Will return at a later date.   Danielle Dess 12/05/2022, 3:17 PM

## 2022-12-05 NOTE — ED Notes (Signed)
Patient resting in bed comfortably at this time. No concerns or needs voiced at this time

## 2022-12-05 NOTE — ED Notes (Signed)
Patient resting in bed. Patient provided with water per request and tolerated ordered medication well. Patient given warm blanket at this time

## 2022-12-05 NOTE — ED Provider Notes (Signed)
-----------------------------------------   8:17 AM on 12/05/2022 -----------------------------------------   Blood pressure 122/74, pulse 74, temperature 97.9 F (36.6 C), temperature source Oral, resp. rate 19, SpO2 97 %.  The patient is calm and cooperative at this time.  There have been no acute events since the last update.  Awaiting disposition plan from [facility] team.   Loleta Rose, MD 12/05/22 [phone]

## 2022-12-05 NOTE — ED Notes (Signed)
Patient's bed linens changed at this time. Patient dressed into clean and dry clothing. Patient now sitting in recliner watching tv. Patient provided with warm blankets, juice, and cereal.

## 2022-12-05 NOTE — ED Notes (Signed)
Patient is resting comfortably. 

## 2022-12-06 DIAGNOSIS — E876 Hypokalemia: Secondary | ICD-10-CM | POA: Diagnosis not present

## 2022-12-06 NOTE — ED Provider Notes (Signed)
Emergency Medicine Observation Re-evaluation Note  Physical Exam   BP 124/71 (BP Location: Right Arm)   Pulse 81   Temp 97.9 F (36.6 C) (Oral)   Resp 20   SpO2 94%   Pt is calm and resting, respirations unlabored, and appears comfortable.   ED Course / MDM   No reported events during my shift at the time of this note.   Pt is awaiting dispo from SW   Pilar Jarvis MD    Pilar Jarvis, MD 12/06/22 [phone]

## 2022-12-07 DIAGNOSIS — E876 Hypokalemia: Secondary | ICD-10-CM | POA: Diagnosis not present

## 2022-12-07 LAB — URINE DRUG SCREEN, QUALITATIVE (ARMC ONLY)
Amphetamines, Ur Screen: NOT DETECTED
Barbiturates, Ur Screen: NOT DETECTED
Benzodiazepine, Ur Scrn: NOT DETECTED
Cannabinoid 50 Ng, Ur ~~LOC~~: NOT DETECTED
Cocaine Metabolite,Ur ~~LOC~~: NOT DETECTED
MDMA (Ecstasy)Ur Screen: NOT DETECTED
Methadone Scn, Ur: NOT DETECTED
Opiate, Ur Screen: NOT DETECTED
Phencyclidine (PCP) Ur S: NOT DETECTED
Tricyclic, Ur Screen: NOT DETECTED

## 2022-12-07 LAB — COMPREHENSIVE METABOLIC PANEL
ALT: 15 U/L (ref 0–44)
AST: 18 U/L (ref 15–41)
Albumin: 3.1 g/dL — ABNORMAL LOW (ref 3.5–5.0)
Alkaline Phosphatase: 86 U/L (ref 38–126)
Anion gap: 10 (ref 5–15)
BUN: 20 mg/dL (ref 8–23)
CO2: 20 mmol/L — ABNORMAL LOW (ref 22–32)
Calcium: 9 mg/dL (ref 8.9–10.3)
Chloride: 105 mmol/L (ref 98–111)
Creatinine, Ser: 1.02 mg/dL (ref 0.61–1.24)
GFR, Estimated: >60 mL/min (ref >60)
Glucose, Bld: 86 mg/dL (ref 70–99)
Potassium: 4.3 mmol/L (ref 3.5–5.1)
Sodium: 135 mmol/L (ref 135–145)
Total Bilirubin: 0.6 mg/dL (ref 0.3–1.2)
Total Protein: 7.4 g/dL (ref 6.5–8.1)

## 2022-12-07 LAB — URINALYSIS, ROUTINE W REFLEX MICROSCOPIC
Bilirubin Urine: NEGATIVE
Glucose, UA: NEGATIVE mg/dL
Hgb urine dipstick: NEGATIVE
Ketones, ur: NEGATIVE mg/dL
Leukocytes,Ua: NEGATIVE
Nitrite: NEGATIVE
Protein, ur: NEGATIVE mg/dL
Specific Gravity, Urine: 1.02 (ref 1.005–1.030)
pH: 5 (ref 5.0–8.0)

## 2022-12-07 LAB — CBC
HCT: 36.7 % — ABNORMAL LOW (ref 39.0–52.0)
Hemoglobin: 12.1 g/dL — ABNORMAL LOW (ref 13.0–17.0)
MCH: 33.1 pg (ref 26.0–34.0)
MCHC: 33 g/dL (ref 30.0–36.0)
MCV: 100.3 fL — ABNORMAL HIGH (ref 80.0–100.0)
Platelets: 426 10*3/uL — ABNORMAL HIGH (ref 150–400)
RBC: 3.66 MIL/uL — ABNORMAL LOW (ref 4.22–5.81)
RDW: 12.6 % (ref 11.5–15.5)
WBC: 9.6 10*3/uL (ref 4.0–10.5)
nRBC: 0 % (ref 0.0–0.2)

## 2022-12-07 LAB — CBG MONITORING, ED: Glucose-Capillary: 102 mg/dL — ABNORMAL HIGH (ref 70–99)

## 2022-12-07 NOTE — Progress Notes (Signed)
Physical Therapy Treatment Patient Details Name: Zachary Pugh. MRN: [id number] DOB: February 04, 1956 Today's Date: 12/07/2022   History of Present Illness Zachary Pugh. is a 66 y.o. male with a history of seizure disorder brought into the ER by family for placement.  Patient's primary care taker just recently died.  The patient's niece and nephew are now helping arrange care for the patient but found him in a house without running water or heat.  He was in a very poor state condition.  APS is involved.  They are currently working on placement.  Poorly has not been taking his medications.  States he has had some falls.  Does have reported history of substance abuse but denies any use recently.    PT Comments    Pt was calm, laying in bed, upon arriving. He remains disoriented to situation, time and presents with poor overall awareness of deficits. He was however easily and safely able to exit bed, stand, and ambulate with RW. Much less antalgic gait pattern with improved overall gait tolerance. Pt's cognition and impulsivity makes him high fall risk. Highly recommend 24/7 assistance/supervision at DC to maximize his safe with all ADLs.    Recommendations for follow up therapy are one component of a multi-disciplinary discharge planning process, led by the attending physician.  Recommendations may be updated based on patient status, additional functional criteria and insurance authorization.  Follow Up Recommendations  Other (comment) (will need 24/7 assistance/supervision 2/2 to cognition and safety concerns. pt has proven unable to care for himself in previous admissions) Can patient physically be transported by private vehicle: No   Assistance Recommended at Discharge Frequent or constant Supervision/Assistance  Patient can return home with the following A little help with walking and/or transfers;A little help with bathing/dressing/bathroom;Assistance with cooking/housework;Assistance  with feeding;Direct supervision/assist for medications management;Direct supervision/assist for financial management;Assist for transportation;Help with stairs or ramp for entrance   Equipment Recommendations  Rolling walker (2 wheels)       Precautions / Restrictions Precautions Precautions: Fall Precaution Comments: L hip ORIF on 10/16/22. Restrictions Weight Bearing Restrictions: No     Mobility  Bed Mobility Overal bed mobility: Needs Assistance Bed Mobility: Supine to Sit, Sit to Supine  Supine to sit: Supervision Sit to supine: Supervision     Transfers Overall transfer level: Needs assistance Equipment used: Rolling walker (2 wheels) Transfers: Sit to/from Stand Sit to Stand: Supervision       Ambulation/Gait Ambulation/Gait assistance: Supervision, Min guard Gait Distance (Feet): 200 Feet Assistive device: Rolling walker (2 wheels) Gait Pattern/deviations: Step-through pattern, Antalgic Gait velocity: decreased     General Gait Details: pt ambulated with much less antalgic step to gait pattern.    Balance Overall balance assessment: Needs assistance Sitting-balance support: Bilateral upper extremity supported, Feet supported Sitting balance-Leahy Scale: Good     Standing balance support: Bilateral upper extremity supported, During functional activity, Reliant on assistive device for balance Standing balance-Leahy Scale: Fair Standing balance comment: pt demonstrated much improved safety with mobility. Author questions if due to being more calm and less impulsive. Still disoriented overall.       Cognition Arousal/Alertness: Awake/alert Behavior During Therapy: WFL for tasks assessed/performed      Safety/Judgement: Decreased awareness of safety, Decreased awareness of deficits Awareness: Intellectual Problem Solving: Slow processing, Decreased initiation, Difficulty sequencing, Requires verbal cues, Requires tactile cues General Comments: Pt A and O x  4  Pertinent Vitals/Pain Pain Assessment Pain Assessment: No/denies pain     PT Goals (current goals can now be found in the care plan section) Acute Rehab PT Goals Patient Stated Goal: none stated Progress towards PT goals: Progressing toward goals    Frequency    Min 2X/week      PT Plan Current plan remains appropriate       AM-PAC PT "6 Clicks" Mobility   Outcome Measure  Help needed turning from your back to your side while in a flat bed without using bedrails?: A Little Help needed moving from lying on your back to sitting on the side of a flat bed without using bedrails?: A Little Help needed moving to and from a bed to a chair (including a wheelchair)?: A Little Help needed standing up from a chair using your arms (e.g., wheelchair or bedside chair)?: A Little Help needed to walk in hospital room?: A Little Help needed climbing 3-5 steps with a railing? : A Little 6 Click Score: 18    End of Session   Activity Tolerance: Patient tolerated treatment well Patient left: in bed;with call bell/phone within reach;with bed alarm set Nurse Communication: Mobility status PT Visit Diagnosis: Unsteadiness on feet (R26.81);Muscle weakness (generalized) (M62.81);History of falling (Z91.81);Difficulty in walking, not elsewhere classified (R26.2)     Time: 1444-1500 PT Time Calculation (min) (ACUTE ONLY): 16 min  Charges:  $Gait Training: 8-22 mins                     Jetta Lout PTA 12/07/22, 3:15 PM

## 2022-12-07 NOTE — ED Notes (Signed)
RN in room due to bed alarm going off. Pt on left side of bed with feet propped up on recliner with feces on the ground. RN asked pt if he needed assistance why the call bell was not utilized. Pt asked to use walker to ambulate to commode to perform personal hygiene. This RN did full linen change and feces cleaned from floor. Pt placed back in bed with new brief. Call bell in reach. Pt instructed to utilize when needed. EVS called at this time.

## 2022-12-07 NOTE — ED Provider Notes (Signed)
-----------------------------------------   5:46 AM on 12/07/2022 -----------------------------------------   Blood pressure 105/61, pulse (!) 51, temperature 97.8 F (36.6 C), temperature source Oral, resp. rate 15, SpO2 97 %.  The patient is calm and cooperative at this time.  There have been no acute events since the last update.  Awaiting disposition plan from Social Work team.   Irean Hong, MD 12/07/22 [phone]

## 2022-12-07 NOTE — ED Notes (Signed)
Pt given phone to speak with family member.

## 2022-12-07 NOTE — ED Notes (Signed)
Assisted pt to use urinal. Pt was able to sit at bedside.

## 2022-12-08 DIAGNOSIS — E876 Hypokalemia: Secondary | ICD-10-CM | POA: Diagnosis not present

## 2022-12-08 NOTE — ED Notes (Signed)
Pt intermittently calling out "God Dammit." Pt admits frustration w/ADLs.  Pt noted to be walking in room naked.  Stated that gown was bothering him.  This RN assisted pt with throwing away his empty dinner tray and putting on new gown.

## 2022-12-08 NOTE — ED Provider Notes (Signed)
-----------------------------------------   5:42 AM on 12/08/2022 -----------------------------------------   Blood pressure 129/87, pulse 61, temperature 97.8 F (36.6 C), temperature source Oral, resp. rate 14, SpO2 96 %.  The patient is calm and cooperative at this time.  There have been no acute events since the last update.  Awaiting disposition plan from case management/social work.    Antwan Bribiesca, Layla Maw, DO 12/08/22 [phone]

## 2022-12-08 NOTE — ED Notes (Signed)
Writer rounded on patient. Pt resting comfortably in bed, denies needs. Urine noted all over the floor but not in the bed. Pt provided warm blankets. Writer cleaned up floor and reminded patient that he has a urinal right beside him in bed as well as a call light if he needs help using the bathroom. Pt verbalized understanding.

## 2022-12-08 NOTE — ED Notes (Signed)
Pt yelling "God Damnit!" In room. Writer went to check on patient. Pt sitting in bed picking up pieces of food. Writer offered to help, patient declined. Writer assisted in throwing away the rest of food per patient request.

## 2022-12-08 NOTE — Progress Notes (Signed)
Physical Therapy Treatment Patient Details Name: Zachary Pugh. MRN: [id number] DOB: 1956/08/29 Today's Date: 12/08/2022   History of Present Illness Zachary Pugh. is a 66 y.o. male with a history of seizure disorder brought into the ER by family for placement.  Patient's primary care taker just recently died.  The patient's niece and nephew are now helping arrange care for the patient but found him in a house without running water or heat.  He was in a very poor state condition.  APS is involved.  They are currently working on placement.  Poorly has not been taking his medications.  States he has had some falls.  Does have reported history of substance abuse but denies any use recently.    PT Comments    Pt received upright in bed with NT's and EVS cleaning room. Pt agreeable to PT. Maintaining mobility levels but still needing step by step cues for safety and safe use of RW. Performing 200' with RW at slowed gait speed, mod VC's with RW sequencing with steps and turns with poor carryover. Pt returning to room with NT's available to assist in bathing pt. Pt with all needs in reach. Pt improving for mobility but still clearly has very poor safety awareness and LLE weakness placing pt at high risk for falls.     Recommendations for follow up therapy are one component of a multi-disciplinary discharge planning process, led by the attending physician.  Recommendations may be updated based on patient status, additional functional criteria and insurance authorization.  Follow Up Recommendations  Other (comment) (will need 24/7 assistance/supervision 2/2 to cognition and safety concerns. pt has proven unable to care for himself in previous admissions.) Can patient physically be transported by private vehicle: No   Assistance Recommended at Discharge Frequent or constant Supervision/Assistance  Patient can return home with the following A little help with walking and/or transfers;A little  help with bathing/dressing/bathroom;Assistance with cooking/housework;Assistance with feeding;Direct supervision/assist for medications management;Direct supervision/assist for financial management;Assist for transportation;Help with stairs or ramp for entrance   Equipment Recommendations  Rolling walker (2 wheels)    Recommendations for Other Services       Precautions / Restrictions Precautions Precautions: Fall Precaution Comments: L hip ORIF on 10/16/22. Restrictions Weight Bearing Restrictions: No LLE Weight Bearing: Weight bearing as tolerated Other Position/Activity Restrictions: per Dr Kirtland Bouchard note ok for WBAT     Mobility  Bed Mobility Overal bed mobility: Needs Assistance Bed Mobility: Supine to Sit     Supine to sit: Supervision, HOB elevated       Patient Response: Cooperative  Transfers Overall transfer level: Needs assistance Equipment used: Rolling walker (2 wheels) Transfers: Sit to/from Stand Sit to Stand: Supervision           General transfer comment: requiring step by step VC's for safety    Ambulation/Gait Ambulation/Gait assistance: Supervision, Min guard Gait Distance (Feet): 200 Feet Assistive device: Rolling walker (2 wheels) Gait Pattern/deviations: Step-through pattern, Antalgic       General Gait Details: VC's for appropriate and safe RW positioning consistently throughout gait bout.   Stairs             Wheelchair Mobility    Modified Rankin (Stroke Patients Only)       Balance Overall balance assessment: Needs assistance Sitting-balance support: Bilateral upper extremity supported, Feet supported Sitting balance-Leahy Scale: Good     Standing balance support: Bilateral upper extremity supported, During functional activity, Reliant on assistive device for  balance Standing balance-Leahy Scale: Fair Standing balance comment: use of RW                            Cognition Arousal/Alertness:  Awake/alert Behavior During Therapy: WFL for tasks assessed/performed Overall Cognitive Status: No family/caregiver present to determine baseline cognitive functioning                                 General Comments: pleasant and cooperative. Follows single step commands.        Exercises Other Exercises Other Exercises: Safe use of AD with transfers.    General Comments        Pertinent Vitals/Pain Pain Assessment Pain Assessment: No/denies pain    Home Living                          Prior Function            PT Goals (current goals can now be found in the care plan section) Acute Rehab PT Goals Patient Stated Goal: none stated PT Goal Formulation: With family Time For Goal Achievement: 12/11/22 Potential to Achieve Goals: Poor Progress towards PT goals: Progressing toward goals    Frequency    Min 2X/week      PT Plan Current plan remains appropriate    Co-evaluation              AM-PAC PT "6 Clicks" Mobility   Outcome Measure  Help needed turning from your back to your side while in a flat bed without using bedrails?: A Little Help needed moving from lying on your back to sitting on the side of a flat bed without using bedrails?: A Little Help needed moving to and from a bed to a chair (including a wheelchair)?: A Little Help needed standing up from a chair using your arms (e.g., wheelchair or bedside chair)?: A Little Help needed to walk in hospital room?: A Little Help needed climbing 3-5 steps with a railing? : A Little 6 Click Score: 18    End of Session Equipment Utilized During Treatment: Gait belt Activity Tolerance: Patient tolerated treatment well Patient left: with nursing/sitter in room (seated in chair with NT's to assist in bathing) Nurse Communication: Mobility status PT Visit Diagnosis: Unsteadiness on feet (R26.81);Muscle weakness (generalized) (M62.81);History of falling (Z91.81);Difficulty in walking,  not elsewhere classified (R26.2)     Time: 4098-1191 PT Time Calculation (min) (ACUTE ONLY): 23 min  Charges:  $Gait Training: 23-37 mins                    Delphia Grates. Fairly IV, PT, DPT Physical Therapist- [place]  [facility]  12/08/2022, 12:40 PM

## 2022-12-08 NOTE — ED Notes (Signed)
PT at bedside at this time 

## 2022-12-08 NOTE — TOC Progression Note (Addendum)
Transition of Care [facility]) - Progression Note    Patient Details  Name: Zachary Pugh. MRN: [id number] Date of Birth: 06/27/1956  [facility]) CM/SW Contact  Darolyn Rua, [place] Phone Number: 12/08/2022, 10:09 AM  Clinical Narrative:      Vincenza Hews with [facility] reports they have started insurance auth for [name], pending auth at this time.   Reports he is waiting for follow up call from Joy with DSS to ensure Medicaid application was started.   Shaun reports he also confirmed with niece okay for patient to go to [facility] pending confirmation medicaid was started.       Expected Discharge Plan and Services                                               Social Determinants of Health (SDOH) Interventions SDOH Screenings   Food Insecurity: No Food Insecurity (10/16/2022)  Housing: Low Risk  (10/16/2022)  Transportation Needs: No Transportation Needs (10/16/2022)  Utilities: Not At Risk (10/16/2022)  Tobacco Use: High Risk (10/18/2022)    Readmission Risk Interventions    10/25/2022    4:07 PM  Readmission Risk Prevention Plan  Transportation Screening Complete  PCP or Specialist Appt within 3-5 Days Complete  HRI or Home Care Consult Complete  Palliative Care Screening Not Applicable  Medication Review (RN Care Manager) Referral to Pharmacy

## 2022-12-08 NOTE — ED Notes (Signed)
Lunch meal tray given at this time.  

## 2022-12-08 NOTE — ED Notes (Addendum)
This RN to room to give pt's morning medication. Pt has urine and feces all over the floor in the room, pt states "I am not able to make it there." Pt also spitting and placing food on the floor at this time. Offered pt bedside commode. This RN, Letha NT, and [place], NT at bedside to clean pt's room and pt at this time.

## 2022-12-08 NOTE — ED Notes (Signed)
Writer rounded on patient to find patient had removed his brief and urinated all over the floor. Pt himself is not soiled. Writer cleaned floor. Pt denied needing anything at this time.

## 2022-12-09 DIAGNOSIS — E876 Hypokalemia: Secondary | ICD-10-CM | POA: Diagnosis not present

## 2022-12-09 NOTE — ED Notes (Signed)
Dinner meal tray given at this time.  

## 2022-12-09 NOTE — TOC Progression Note (Signed)
Transition of Care [facility]) - Progression Note    Patient Details  Name: Trino Higinbotham. MRN: [id number] Date of Birth: [birth year]  [facility]) CM/SW Contact  Margarito Liner, LCSW Phone Number: 12/09/2022, 10:03 AM  Clinical Narrative:   [facility] liaison left vm for DSS social worker Joy Sumpter yesterday. Waiting on call back with confirmation that Medicaid application was started prior to starting insurance auth. CSW left vm for Joy as well.       Expected Discharge Plan and Services                                               Social Determinants of Health (SDOH) Interventions SDOH Screenings   Food Insecurity: No Food Insecurity (10/16/2022)  Housing: Low Risk  (10/16/2022)  Transportation Needs: No Transportation Needs (10/16/2022)  Utilities: Not At Risk (10/16/2022)  Tobacco Use: High Risk (10/18/2022)    Readmission Risk Interventions    10/25/2022    4:07 PM  Readmission Risk Prevention Plan  Transportation Screening Complete  PCP or Specialist Appt within 3-5 Days Complete  HRI or Home Care Consult Complete  Palliative Care Screening Not Applicable  Medication Review (RN Care Manager) Referral to Pharmacy

## 2022-12-09 NOTE — ED Notes (Signed)
This RN at bedside, pt has feces on the floor again. Pt states that he was unable to make it. EVS called at this time to help clean the floor.

## 2022-12-09 NOTE — ED Notes (Signed)
Report received from Jacob, RN 

## 2022-12-09 NOTE — ED Provider Notes (Signed)
-----------------------------------------   6:35 AM on 12/09/2022 -----------------------------------------   Blood pressure 123/88, pulse 65, temperature 98.1 F (36.7 C), temperature source Oral, resp. rate 18, SpO2 96 %.  The patient is calm and cooperative at this time.  There have been no acute events since the last update.  Awaiting disposition plan from Social Work team.   Irean Hong, MD 12/09/22 [phone]

## 2022-12-09 NOTE — Progress Notes (Signed)
Occupational Therapy Treatment Patient Details Name: Zachary Pugh. MRN: [id number] DOB: 1956-09-12 Today's Date: 12/09/2022   History of present illness Chandlor Noecker. is a 66 y.o. male with a history of seizure disorder brought into the ER by family for placement.  Patient's primary care taker just recently died.  The patient's niece and nephew are now helping arrange care for the patient but found him in a house without running water or heat.  He was in a very poor state condition.  APS is involved.  They are currently working on placement.  Poorly has not been taking his medications.  States he has had some falls.  Does have reported history of substance abuse but denies any use recently.   OT comments  Chart reviewed, nurse cleared pt for participation in OT tx session. Pt is greeted in room, alert and oriented to self and place; poor safety awareness throughout. Tx session targeted improving functional activity tolerance in the setting of ADL tasks, functional mobility to improve ADL participation. Improvements continue to be noted in regards to mobility with RW (in room with RW approx 35') however cognition continues to impair ADL/mobility status. Recommend skilled OT in next venue of care to address ADL status. OT will continue to follow acutely.    Recommendations for follow up therapy are one component of a multi-disciplinary discharge planning process, led by the attending physician.  Recommendations may be updated based on patient status, additional functional criteria and insurance authorization.    Follow Up Recommendations  Follow physician's recommendations for discharge plan and follow up therapies     Assistance Recommended at Discharge Frequent or constant Supervision/Assistance  Patient can return home with the following  A little help with walking and/or transfers;A little help with bathing/dressing/bathroom;Assistance with cooking/housework;Direct supervision/assist  for medications management;Direct supervision/assist for financial management   Equipment Recommendations  BSC/3in1;Tub/shower seat    Recommendations for Other Services      Precautions / Restrictions Precautions Precautions: Fall Precaution Comments: L hip ORIF on 10/16/22. Restrictions Weight Bearing Restrictions: Yes LLE Weight Bearing: Weight bearing as tolerated       Mobility Bed Mobility Overal bed mobility: Needs Assistance Bed Mobility: Supine to Sit, Sit to Supine     Supine to sit: Modified independent (Device/Increase time) Sit to supine: Modified independent (Device/Increase time)        Transfers Overall transfer level: Needs assistance Equipment used: Rolling walker (2 wheels) Transfers: Sit to/from Stand Sit to Stand: Supervision           General transfer comment: frequent vcs for safety     Balance Overall balance assessment: Needs assistance Sitting-balance support: Bilateral upper extremity supported, Feet supported Sitting balance-Leahy Scale: Good     Standing balance support: Bilateral upper extremity supported, During functional activity, Reliant on assistive device for balance Standing balance-Leahy Scale: Fair                             ADL either performed or assessed with clinical judgement   ADL Overall ADL's : Needs assistance/impaired                                       General ADL Comments: supervision standing at the sink for grooming tasks with RW with step by step vcs for safety, MAX A for LB dressing with step  by step cues, amb in room approx 62' with RW with step by step vcs for RW use- fair-poor carry over from previous sessions    Extremity/Trunk Assessment              Vision       Perception     Praxis      Cognition Arousal/Alertness: Awake/alert Behavior During Therapy: Impulsive Overall Cognitive Status: No family/caregiver present to determine baseline cognitive  functioning Area of Impairment: Orientation, Attention, Memory, Following commands, Safety/judgement, Awareness, Problem solving                 Orientation Level: Disoriented to, Time, Situation Current Attention Level: Sustained Memory: Decreased short-term memory Following Commands: Follows one step commands with increased time Safety/Judgement: Decreased awareness of safety, Decreased awareness of deficits Awareness: Intellectual Problem Solving: Slow processing, Decreased initiation, Difficulty sequencing, Requires verbal cues, Requires tactile cues          Exercises      Shoulder Instructions       General Comments      Pertinent Vitals/ Pain       Pain Assessment Pain Assessment: No/denies pain  Home Living                                          Prior Functioning/Environment              Frequency  Min 2X/week        Progress Toward Goals  OT Goals(current goals can now be found in the care plan section)  Progress towards OT goals: Progressing toward goals     Plan Discharge plan remains appropriate    Co-evaluation                 AM-PAC OT "6 Clicks" Daily Activity     Outcome Measure   Help from another person eating meals?: None Help from another person taking care of personal grooming?: A Little Help from another person toileting, which includes using toliet, bedpan, or urinal?: A Lot Help from another person bathing (including washing, rinsing, drying)?: A Lot Help from another person to put on and taking off regular upper body clothing?: A Little Help from another person to put on and taking off regular lower body clothing?: A Lot 6 Click Score: 16    End of Session Equipment Utilized During Treatment: Rolling walker (2 wheels)  OT Visit Diagnosis: Unsteadiness on feet (R26.81);Muscle weakness (generalized) (M62.81)   Activity Tolerance Patient tolerated treatment well   Patient Left in bed;with  call bell/phone within reach;with bed alarm set   Nurse Communication Mobility status        Time: 2440-1027 OT Time Calculation (min): 16 min  Charges: OT General Charges $OT Visit: 1 Visit OT Treatments $Self Care/Home Management : 8-22 mins  Oleta Mouse, OTD OTR/L  12/09/22, 3:56 PM

## 2022-12-09 NOTE — ED Notes (Signed)
OT at bedside. 

## 2022-12-09 NOTE — ED Notes (Signed)
Pt experiencing loose bowels. Pt ambulated self to toilet w/ walker. Afterwards he was standing in the door naked asking for help which appears he needed help cleaning himself. Patient cleaned and diapers placed, chucks in bed, and new gown.

## 2022-12-09 NOTE — ED Notes (Signed)
This RN at bedside to administer medication, pt clean and dry at this time. Pt is naked and will not keep a gown on but pt seems to have used the restroom in the toilet.

## 2022-12-09 NOTE — ED Notes (Signed)
Breakfast meal tray delivered by dietary at this time.  

## 2022-12-10 ENCOUNTER — Encounter: Payer: Self-pay | Admitting: Emergency Medicine

## 2022-12-10 DIAGNOSIS — E876 Hypokalemia: Secondary | ICD-10-CM | POA: Diagnosis not present

## 2022-12-10 NOTE — ED Notes (Signed)
Pt assisted to toilet and then back to bed with no issue.

## 2022-12-10 NOTE — ED Provider Notes (Signed)
-----------------------------------------   9:02 AM on 12/10/2022 -----------------------------------------   Blood pressure 103/72, pulse (!) 55, temperature (!) 97.5 F (36.4 C), temperature source Oral, resp. rate 15, height 1.753 m (5\' 9" ), weight 54.4 kg, SpO2 100 %.  The patient is calm and cooperative at this time.  There have been no acute events since the last update.  Awaiting disposition plan from [facility] team.   [facility], MD 12/10/22 [phone]

## 2022-12-10 NOTE — ED Notes (Signed)
Pt assisted to toilet and back to bed.

## 2022-12-10 NOTE — ED Notes (Signed)
Lunch given to pt and tc channel adjusted. Pt very appreciative.

## 2022-12-10 NOTE — ED Notes (Signed)
Pt was restless through the night. Pt urinated on floor x2. At the beginning of the shift pt walked to the chair in the room. Was found he had urinated onto the floor. Pt assisted back to bed, new gown was provided, and partial bed bath was provided as he urinated down leg. The second time pt urinated onto the side of the bed. Bedside cleaning was provided. Bed pad was changed. Pt was redirected to use urinal or call out for assistance to ambulate to the bathroom. RN responded to bed alarm a third time. Found pt attempting to exit bed. Pt was assisted in using a urinal. Pt attempted to exit bed a fourth pt. Pt was found on bedside toilet as he had ambulated independently Pt was then assisted by that RN Heather back to the bed.

## 2022-12-10 NOTE — ED Notes (Signed)
Pt visualized ambulating to the in room toilet naked.  Pt able to sit on toilet and have a BM.  This RN assisted pt with pericare and helped back into bed.  Gown reapplied.  Pt denies any other needs at this time.

## 2022-12-10 NOTE — ED Notes (Signed)
Patient sitting up in bed with breakfast tray at this time.

## 2022-12-11 DIAGNOSIS — E876 Hypokalemia: Secondary | ICD-10-CM | POA: Diagnosis not present

## 2022-12-11 NOTE — ED Notes (Signed)
Bed alarm went off. Aundra Millet, RN, Jeannett Senior, RN, and [place], EDT entered the room to see what was going on. Pt found sitting on side of bed undressed with a puddle of urine at his feet off the side of the bed. Privacy was provided. Floor was cleaned, pt repositioned in bed, urinal and call light were placed in reach for pt. Pt denied needing anything else at this time.  Bed alarm went off again, minutes after it went off earlier, and pt heard cussing in his room. This EDT came to see what was going on. Pt found sitting up in bed stating "I fucking wet the bed, God dammit." Tech noticed the bed was wet. This tech provided privacy, changed the pts linens and repositioned pt back in bed with urinal and call light still within reach of pt. Tech asked pt if he needed anything further. Pt replied "Go to hell."

## 2022-12-11 NOTE — ED Notes (Signed)
Bed alarm can be heard going off. This tech enters room and notes Pt up and walking with walker, returning to bed from the toilet. Pt assisted back into bed and new gown/warm blankets applied. Pt informed not to get up without a staff member. Pt vocalizes understanding, states "I'm tired of being in this hospital". Pt rests comfortably at this time. With lights dimmed. All bed rails up and bed is locked in lowest position.

## 2022-12-11 NOTE — ED Provider Notes (Signed)
-----------------------------------------   8:35 AM on 12/11/2022 -----------------------------------------   Blood pressure 100/60, pulse (!) 55, temperature 98 F (36.7 C), temperature source Oral, resp. rate 16, height 1.753 m (5\' 9" ), weight 54.4 kg, SpO2 98 %.  The patient is calm and cooperative at this time.  There have been no acute events since the last update.  Awaiting disposition plan from [facility] team.   [facility], MD 12/11/22 [phone]

## 2022-12-11 NOTE — ED Notes (Signed)
Pt ambulated to toilet to urinate and back to bed.  ?

## 2022-12-12 NOTE — ED Notes (Signed)
Pt up to bathroom at this time

## 2022-12-12 NOTE — ED Notes (Signed)
Pt bed alarm going off, this tech went in the room and found pt standing up naked by the door. Pt stated he wanted to lay back in bed and get covered back up.This tech put a brief on pt and got pt back into bed and put him a gown on.  Pt back in bed and comfortable, no other needs at this moment.

## 2022-12-12 NOTE — ED Notes (Signed)
Pt assisted with getting gown on and up to toilet

## 2022-12-12 NOTE — ED Notes (Signed)
Pt given coke with ice 

## 2022-12-13 DIAGNOSIS — R001 Bradycardia, unspecified: Secondary | ICD-10-CM | POA: Diagnosis not present

## 2022-12-13 DIAGNOSIS — Z5900 Homelessness unspecified: Secondary | ICD-10-CM | POA: Diagnosis not present

## 2022-12-13 MED ORDER — LEVETIRACETAM 500 MG PO TABS
500.0000 mg | ORAL_TABLET | Freq: Two times a day (BID) | ORAL | Status: DC
  Administered 2022-12-13 – 2022-12-17 (×9): 500 mg via ORAL
  Filled 2022-12-13 (×8): qty 1

## 2022-12-13 MED ORDER — RISPERIDONE 1 MG PO TABS
1.0000 mg | ORAL_TABLET | Freq: Two times a day (BID) | ORAL | Status: DC
  Administered 2022-12-13 – 2022-12-17 (×8): 1 mg via ORAL
  Filled 2022-12-13 (×8): qty 1

## 2022-12-13 NOTE — ED Notes (Signed)
Pt given lunch tray.

## 2022-12-13 NOTE — ED Notes (Signed)
Checked on pt, denies needing anything at this time. Will continue to monitor.

## 2022-12-13 NOTE — ED Notes (Signed)
Pt heard yelling curses, when checked pt says "everything is messed up, I'm just tired of it. Everything will be better when I'm dead and gone, then I'll be satisfied." Pt encouraged to think positive, pt denies needing anything in room fixed. Will continue to monitor.

## 2022-12-13 NOTE — ED Notes (Signed)
Pt eating lunch, denies any pain or other issues. Denies needing anything at this time.

## 2022-12-13 NOTE — ED Notes (Signed)
Pt can be heard every so often swearing. When asked pt states he "needs help" but doesn't know what he needs then says "never mind." Pt encouraged to use call bell if anything arises.

## 2022-12-13 NOTE — ED Provider Notes (Signed)
Emergency Medicine Observation Re-evaluation Note  Zachary Pugh. is a 67 y.o. male, seen on rounds today.  Physical Exam  BP 136/74 (BP Location: Right Arm)   Pulse (!) 58   Temp 97.7 F (36.5 C) (Oral)   Resp 18   Ht 5\' 9"  (1.753 m)   Wt 54.4 kg   SpO2 100%   BMI 17.72 kg/m  Physical Exam General: Patient resting comfortably in bed Lungs: Patient not in respiratory distress Psych: Patient not combative  ED Course / MDM  EKG:  Plan  Current plan is for placement.    Nena Polio, MD 12/13/22 [phone]

## 2022-12-14 DIAGNOSIS — R001 Bradycardia, unspecified: Secondary | ICD-10-CM | POA: Diagnosis not present

## 2022-12-14 DIAGNOSIS — Z5900 Homelessness unspecified: Secondary | ICD-10-CM | POA: Diagnosis not present

## 2022-12-14 NOTE — ED Provider Notes (Signed)
-----------------------------------------   5:40 AM on 12/14/2022 -----------------------------------------   Blood pressure 102/67, pulse 60, temperature 97.7 F (36.5 C), temperature source Oral, resp. rate 18, height 5\' 9"  (1.753 m), weight 54.4 kg, SpO2 98 %.  The patient is calm and cooperative at this time.  There have been no acute events since the last update.  Awaiting disposition plan from Social Work team.   Paulette Blanch, MD 12/14/22 [phone]

## 2022-12-14 NOTE — TOC Progression Note (Signed)
Transition of Care [facility]) - Progression Note    Patient Details  Name: Zachary Pugh. MRN: [id number] Date of Birth: 08-23-1956  Transition of Care [facility]) CM/SW Contact  Shelbie Hutching, RN Phone Number: 12/14/2022, 2:53 PM  Clinical Narrative:    RNCM was able to speak with [facility], they are ready to accept patient but they do need the Medicaid app started first.  Steffanie Dunn  with [place] [employer] is unable to start the Medicaid application as they are not the guardian of the patient.  Reached out to the Niece April Berhane.  She will be coming up to the hospital tomorrow, she will be able to do the Medicaid application.  Darcy with Financial counseling will call the niece tomorrow around 1030 to start the application.   Once application submitted patient can go to [facility] in [place].        Expected Discharge Plan and Services                                               Social Determinants of Health (SDOH) Interventions SDOH Screenings   Food Insecurity: No Food Insecurity (10/16/2022)  Housing: Low Risk  (10/16/2022)  Transportation Needs: No Transportation Needs (10/16/2022)  Utilities: Not At Risk (10/16/2022)  Tobacco Use: High Risk (12/10/2022)    Readmission Risk Interventions    10/25/2022    4:07 PM  Readmission Risk Prevention Plan  Transportation Screening Complete  PCP or Specialist Appt within 3-5 Days Complete  HRI or Home Care Consult Complete  Palliative Care Screening Not Applicable  Medication Review (RN Care Manager) Referral to Pharmacy

## 2022-12-14 NOTE — ED Notes (Signed)
Assumed care from Catherine, RN. Pt resting comfortably in bed at this time. Pt denies any current needs or questions.   

## 2022-12-14 NOTE — ED Notes (Addendum)
This tech was walking by pt room when his bed alarm was going off. Pt up at the door with no gown on. Pt then walked back into the bed once this tech came in. Pt states "I don't know what I'm doing." Pt assisted back into bed and put gown back on pt. Bed alarm on.

## 2022-12-14 NOTE — ED Notes (Signed)
Pt calm and pleasant. Explained to patient the importance of calling before getting out of bed. Encouraged patient to wear gown. Pt continues to take gown off and walk in room with no clothes on. Pt resting comfortably in the bed.

## 2022-12-14 NOTE — ED Notes (Signed)
Pt awake, at baseline, states that all he needs is breakfast. Pt told that breakfast trays have not arrived from dietary and that I would be on the lookout for it.

## 2022-12-14 NOTE — Progress Notes (Signed)
Physical Therapy Treatment Patient Details Name: Zachary Pugh. MRN: [id number] DOB: [birth year] Today's Date: 12/14/2022   History of Present Illness Zachary Brookover. is a 67 y.o. male with a history of seizure disorder brought into the ER by family for placement.  Patient's primary care taker just recently died.  The patient's niece and nephew are now helping arrange care for the patient but found him in a house without running water or heat.  He was in a very poor state condition.  APS is involved.  They are currently working on placement.  Poorly has not been taking his medications.  States he has had some falls.  Does have reported history of substance abuse but denies any use recently.    PT Comments    Pt received upright in bed agreeable to PT services. PT exiting bed mod-I ambulating with RW ~200'. Pt remains grossly supervision with intermittent bouts of minguard and frequent multimodal cuing for scanning environment, keeping RW closer to BOS, ambulating in straight line to prevent R/L veering, and maintaining feet within BOS of RW. Limited carryover throughout encounter leading to cognition as major cause for high falls risk. Pt returned to supine in bed needing re-education on LE exercise with good carryover after re-education. Pt continues to require placement as he is unable to care for himself and would also benefit from additional skilled PT to reduce falls risk and reduce AD need to return to PLOF. All needs in reach in supine with lunch tray provided w pt placed in upright posture to prevent aspiration. Re-oriented to call bell if pt needs assistance. Pt understanding.      Recommendations for follow up therapy are one component of a multi-disciplinary discharge planning process, led by the attending physician.  Recommendations may be updated based on patient status, additional functional criteria and insurance authorization.  Follow Up Recommendations  Other (comment) (will  need 24/7 assistance/supervision 2/2 to cognition and safety concerns. pt has proven unable to care for himself in previous admissions.) Can patient physically be transported by private vehicle: Yes   Assistance Recommended at Discharge Frequent or constant Supervision/Assistance  Patient can return home with the following A little help with walking and/or transfers;A little help with bathing/dressing/bathroom;Assistance with cooking/housework;Assistance with feeding;Direct supervision/assist for medications management;Direct supervision/assist for financial management;Assist for transportation;Help with stairs or ramp for entrance   Equipment Recommendations  Rolling walker (2 wheels)    Recommendations for Other Services       Precautions / Restrictions Precautions Precautions: Fall Precaution Comments: L hip ORIF on 10/16/22. Restrictions Weight Bearing Restrictions: Yes LLE Weight Bearing: Weight bearing as tolerated Other Position/Activity Restrictions: per Dr Raliegh Ip note ok for WBAT     Mobility  Bed Mobility Overal bed mobility: Modified Independent               Patient Response: Cooperative  Transfers Overall transfer level: Needs assistance Equipment used: Rolling walker (2 wheels) Transfers: Sit to/from Stand Sit to Stand: Supervision           General transfer comment: VC's for safe hand placement    Ambulation/Gait Ambulation/Gait assistance: Supervision, Min guard Gait Distance (Feet): 200 Feet Assistive device: Rolling walker (2 wheels) Gait Pattern/deviations: Step-through pattern, Antalgic       General Gait Details: Requires VC's for scanning environment, maintaining feet within RW with turns, RW proximity,   Marine scientist  Rankin (Stroke Patients Only)       Balance Overall balance assessment: Needs assistance Sitting-balance support: Bilateral upper extremity supported, Feet supported Sitting  balance-Leahy Scale: Good       Standing balance-Leahy Scale: Fair Standing balance comment: use of RW                            Cognition Arousal/Alertness: Awake/alert Behavior During Therapy: Impulsive Overall Cognitive Status: No family/caregiver present to determine baseline cognitive functioning                                          Exercises General Exercises - Lower Extremity Short Arc Quad: AROM, Strengthening, Supine, 15 reps, Left Heel Slides: AROM, Strengthening, Supine, Left Hip ABduction/ADduction: Strengthening, Left, 15 reps, Supine Straight Leg Raises: AROM, Strengthening, Supine, Both, Left, 15 reps Other Exercises Other Exercises: VC's for scanning environment, using RW ambulating in straight line    General Comments        Pertinent Vitals/Pain Pain Assessment Pain Assessment: No/denies pain    Home Living                          Prior Function            PT Goals (current goals can now be found in the care plan section) Acute Rehab PT Goals Patient Stated Goal: none stated PT Goal Formulation: With family Time For Goal Achievement: 12/11/22 Potential to Achieve Goals: Poor Progress towards PT goals: Progressing toward goals    Frequency    Min 2X/week      PT Plan Current plan remains appropriate    Co-evaluation              AM-PAC PT "6 Clicks" Mobility   Outcome Measure  Help needed turning from your back to your side while in a flat bed without using bedrails?: A Little Help needed moving from lying on your back to sitting on the side of a flat bed without using bedrails?: A Little Help needed moving to and from a bed to a chair (including a wheelchair)?: A Little Help needed standing up from a chair using your arms (e.g., wheelchair or bedside chair)?: A Little Help needed to walk in hospital room?: A Little Help needed climbing 3-5 steps with a railing? : A Little 6 Click  Score: 18    End of Session Equipment Utilized During Treatment: Gait belt Activity Tolerance: Patient tolerated treatment well Patient left: in bed;with call bell/phone within reach;with bed alarm set Nurse Communication: Mobility status PT Visit Diagnosis: Unsteadiness on feet (R26.81);Muscle weakness (generalized) (M62.81);History of falling (Z91.81);Difficulty in walking, not elsewhere classified (R26.2)     Time: 6629-4765 PT Time Calculation (min) (ACUTE ONLY): 24 min  Charges:  $Gait Training: 8-22 mins $Therapeutic Exercise: 8-22 mins                     Loriann Bosserman M. Fairly IV, PT, DPT Physical Therapist- [facility]  12/14/2022, 2:26 PM

## 2022-12-14 NOTE — ED Notes (Signed)
Per pt request - pt given breakfast tray, sandwich box, sprite and water. Pt denies other needs.

## 2022-12-14 NOTE — Progress Notes (Signed)
Occupational Therapy Re-Evaluation Patient Details Name: Zachary Pugh. MRN: [id number] DOB: 26-Jun-1956 Today's Date: 12/14/2022   History of Present Illness Zachary Harlin. is a 67 y.o. male with a history of seizure disorder brought into the ER by family for placement.  Patient's primary care taker just recently died.  The patient's niece and nephew are now helping arrange care for the patient but found him in a house without running water or heat.  He was in a very poor state condition.  APS is involved.  They are currently working on placement.  Poorly has not been taking his medications.  States he has had some falls.  Does have reported history of substance abuse but denies any use recently.   Clinical Impression   Upon entering the room, pt seated on EOB and attempting to stand. Pt had spilled items in floor and needing assistance to clean up. Pt has food on hospital gown and needing min A to don with cuing for technique. Pt stands with RW and min guard and ambulates to recliner chair in room for therapist to remove soiled linen from bed. Pt needing min A to don brief and then returns to bed in same manner as above. Pt needing min cuing throughout for safety awareness. He was very motivated and pleasant throughout.      Recommendations for follow up therapy are one component of a multi-disciplinary discharge planning process, led by the attending physician.  Recommendations may be updated based on patient status, additional functional criteria and insurance authorization.   Follow Up Recommendations  Follow physician's recommendations for discharge plan and follow up therapies     Assistance Recommended at Discharge Frequent or constant Supervision/Assistance  Patient can return home with the following A little help with walking and/or transfers;A little help with bathing/dressing/bathroom;Assistance with cooking/housework;Direct supervision/assist for medications management;Direct  supervision/assist for financial management       Equipment Recommendations  BSC/3in1;Tub/shower seat       Precautions / Restrictions Precautions Precautions: Fall Precaution Comments: L hip ORIF on 10/16/22. Restrictions Weight Bearing Restrictions: Yes LLE Weight Bearing: Weight bearing as tolerated Other Position/Activity Restrictions: per Dr Raliegh Ip note ok for WBAT      Mobility Bed Mobility Overal bed mobility: Modified Independent                  Transfers Overall transfer level: Needs assistance Equipment used: Rolling walker (2 wheels) Transfers: Sit to/from Stand, Bed to chair/wheelchair/BSC Sit to Stand: Supervision     Step pivot transfers: Min guard     General transfer comment: VC's for safe hand placement      Balance Overall balance assessment: Needs assistance Sitting-balance support: Bilateral upper extremity supported, Feet supported Sitting balance-Leahy Scale: Good     Standing balance support: Bilateral upper extremity supported, During functional activity, Reliant on assistive device for balance Standing balance-Leahy Scale: Fair Standing balance comment: use of RW                           ADL either performed or assessed with clinical judgement   ADL Overall ADL's : Needs assistance/impaired                         Toilet Transfer: Min guard;Rolling walker (2 wheels)           Functional mobility during ADLs: Min guard;Rolling walker (2 wheels)  Vision Patient Visual Report: No change from baseline              Pertinent Vitals/Pain Pain Assessment Pain Assessment: No/denies pain     Hand Dominance     Extremity/Trunk Assessment Upper Extremity Assessment Upper Extremity Assessment: Generalized weakness   Lower Extremity Assessment Lower Extremity Assessment: Generalized weakness          Cognition Arousal/Alertness: Awake/alert Behavior During Therapy: Impulsive Overall  Cognitive Status: No family/caregiver present to determine baseline cognitive functioning                         Following Commands: Follows one step commands with increased time       General Comments: pleasant and cooperative. Follows single step commands.       OT Goals(Current goals can be found in the care plan section) Acute Rehab OT Goals Patient Stated Goal: to feel better OT Goal Formulation: With patient Time For Goal Achievement: 01/11/23 Potential to Achieve Goals: Good ADL Goals Pt Will Perform Lower Body Dressing: with supervision Pt Will Transfer to Toilet: with supervision Pt Will Perform Toileting - Clothing Manipulation and hygiene: with supervision  OT Frequency: Min 2X/week       AM-PAC OT "6 Clicks" Daily Activity     Outcome Measure Help from another person eating meals?: None Help from another person taking care of personal grooming?: A Little Help from another person toileting, which includes using toliet, bedpan, or urinal?: A Lot Help from another person bathing (including washing, rinsing, drying)?: A Lot Help from another person to put on and taking off regular upper body clothing?: A Little Help from another person to put on and taking off regular lower body clothing?: A Lot 6 Click Score: 16   End of Session Equipment Utilized During Treatment: Rolling walker (2 wheels) Nurse Communication: Mobility status  Activity Tolerance: Patient tolerated treatment well Patient left: in bed;with call bell/phone within reach;with bed alarm set  OT Visit Diagnosis: Unsteadiness on feet (R26.81);Muscle weakness (generalized) (M62.81)                Time: 1038-1050 OT Time Calculation (min): 12 min Charges:  OT General Charges $OT Visit: 1 Visit OT Evaluation $OT Re-eval: 1 Re-eval OT Treatments $Therapeutic Activity: 8-22 mins  Darleen Crocker, MS, OTR/L , CBIS ascom [phone]  12/14/22, 2:57 PM

## 2022-12-15 DIAGNOSIS — E876 Hypokalemia: Secondary | ICD-10-CM | POA: Diagnosis not present

## 2022-12-15 NOTE — ED Notes (Signed)
Pt ambulated to toilet and comfortably put into bed.

## 2022-12-15 NOTE — ED Provider Notes (Signed)
-----------------------------------------   5:10 AM on 12/15/2022 -----------------------------------------   Blood pressure 123/69, pulse 64, temperature 97.7 F (36.5 C), temperature source Oral, resp. rate 14, height 5\' 9"  (1.753 m), weight 54.4 kg, SpO2 96 %.  The patient is calm and cooperative at this time.  There have been no acute events since the last update.  Awaiting disposition plan from Social Work team.   Paulette Blanch, MD 12/15/22 [phone]

## 2022-12-15 NOTE — ED Notes (Signed)
Pt resting comfortably. Pt niece bathed patient and dressed patient. Pt given gram crackers.

## 2022-12-15 NOTE — ED Provider Notes (Signed)
Emergency Medicine Observation Re-evaluation Note  Zachary Pugh. is a 67 y.o. male, seen on rounds today.  Pt initially presented to the ED for complaints of evaluation Currently, the patient is resting comfortably.  Physical Exam  BP 123/69 (BP Location: Right Arm)   Pulse 64   Temp 97.7 F (36.5 C) (Oral)   Resp 14   Ht 5\' 9"  (1.753 m)   Wt 54.4 kg   SpO2 96%   BMI 17.72 kg/m  Physical Exam General: No acute distress Cardiac: Well-perfused extremities Lungs: No respiratory distress Psych: Appropriate mood and affect  ED Course / MDM  EKG:EKG Interpretation  Date/Time:  Tuesday December 07 2022 03:04:17 EST Ventricular Rate:  48 PR Interval:  172 QRS Duration: 124 QT Interval:  471 QTC Calculation: 421 R Axis:   63 Text Interpretation: Sinus bradycardia Nonspecific intraventricular conduction delay Confirmed by UNCONFIRMED, DOCTOR ([id number]), editor Mel Almond, Tammy [phone]) on 12/07/2022 8:05:59 AM  I have reviewed the labs performed to date as well as medications administered while in observation.  Recent changes in the last 24 hours include none.  Plan  Current plan is for social work disposition.    Zachary Plummer, MD 12/15/22 [phone]

## 2022-12-16 DIAGNOSIS — E876 Hypokalemia: Secondary | ICD-10-CM | POA: Diagnosis not present

## 2022-12-16 NOTE — ED Provider Notes (Signed)
Emergency Medicine Observation Re-evaluation Note  Physical Exam   BP 116/68 (BP Location: Right Arm)   Pulse 82   Temp (!) 97.5 F (36.4 C) (Oral)   Resp 16   Ht 5\' 9"  (1.753 m)   Wt 54.4 kg   SpO2 100%   BMI 17.72 kg/m   Pt is calm and resting, respirations unlabored, and appears comfortable.   ED Course / MDM   No reported events during my shift at the time of this note.   Pt is awaiting dispo from SW   Lucillie Garfinkel MD    Lucillie Garfinkel, MD 12/16/22 [phone]

## 2022-12-16 NOTE — ED Notes (Signed)
Pt sleeping at this time.

## 2022-12-16 NOTE — Progress Notes (Signed)
Occupational Therapy Treatment Patient Details Name: Zachary Pugh. MRN: [id number] DOB: 01-Nov-1956 Today's Date: 12/16/2022   History of present illness Zachary Pugh. is a 67 y.o. male with a history of seizure disorder brought into the ER by family for placement.  Patient's primary care taker just recently died.  The patient's niece and nephew are now helping arrange care for the patient but found him in a house without running water or heat.  He was in a very poor state condition.  APS is involved.  They are currently working on placement.  Poorly has not been taking his medications.  States he has had some falls.  Does have reported history of substance abuse but denies any use recently.   OT comments  Chart reviewed, pt greeted in bed agreeable to OT tx session. Tx session targeted improving ADL indep and functional actvity tolerance. Slow progress is being made towards goals, with pt continuing to require frequent-step by step vcs for safety/technique during ADL, amb. Grooming tasks completed with supervision, amb in room approx 20' with RW with supervision, LB dressing with MIN A (brief).  OT will continue to follow acutely to address functional deficits.    Recommendations for follow up therapy are one component of a multi-disciplinary discharge planning process, led by the attending physician.  Recommendations may be updated based on patient status, additional functional criteria and insurance authorization.    Follow Up Recommendations  Follow physician's recommendations for discharge plan and follow up therapies     Assistance Recommended at Discharge Frequent or constant Supervision/Assistance  Patient can return home with the following  A little help with walking and/or transfers;A little help with bathing/dressing/bathroom;Assistance with cooking/housework;Direct supervision/assist for medications management;Direct supervision/assist for financial management   Equipment  Recommendations  BSC/3in1;Tub/shower seat    Recommendations for Other Services      Precautions / Restrictions Precautions Precautions: Fall Precaution Comments: L hip ORIF on 10/16/22. Restrictions Weight Bearing Restrictions: Yes LLE Weight Bearing: Weight bearing as tolerated       Mobility Bed Mobility Overal bed mobility: Modified Independent                  Transfers Overall transfer level: Needs assistance Equipment used: Rolling walker (2 wheels) Transfers: Sit to/from Stand Sit to Stand: Supervision                 Balance Overall balance assessment: Needs assistance Sitting-balance support: Bilateral upper extremity supported, Feet supported Sitting balance-Leahy Scale: Good     Standing balance support: Bilateral upper extremity supported, During functional activity, Reliant on assistive device for balance Standing balance-Leahy Scale: Fair                             ADL either performed or assessed with clinical judgement   ADL Overall ADL's : Needs assistance/impaired                                       General ADL Comments: MIN A for LB dressing, supervision standing at the sink for grooming tasks, amb in room with RW with close superivsion approx 20'; step by stepvcs throughout for safe RW use, safety    Extremity/Trunk Assessment              Vision       Perception  Praxis      Cognition Arousal/Alertness: Awake/alert Behavior During Therapy: Impulsive Overall Cognitive Status: No family/caregiver present to determine baseline cognitive functioning Area of Impairment: Orientation, Attention, Memory, Following commands, Safety/judgement, Awareness, Problem solving                 Orientation Level: Disoriented to, Time, Situation Current Attention Level: Sustained Memory: Decreased short-term memory Following Commands: Follows one step commands with increased  time Safety/Judgement: Decreased awareness of safety, Decreased awareness of deficits Awareness: Intellectual Problem Solving: Slow processing, Decreased initiation, Difficulty sequencing, Requires verbal cues, Requires tactile cues          Exercises      Shoulder Instructions       General Comments      Pertinent Vitals/ Pain       Pain Assessment Pain Assessment: No/denies pain  Home Living                                          Prior Functioning/Environment              Frequency  Min 2X/week        Progress Toward Goals  OT Goals(current goals can now be found in the care plan section)  Progress towards OT goals: Progressing toward goals     Plan Discharge plan remains appropriate;Frequency remains appropriate    Co-evaluation                 AM-PAC OT "6 Clicks" Daily Activity     Outcome Measure   Help from another person eating meals?: None Help from another person taking care of personal grooming?: A Little Help from another person toileting, which includes using toliet, bedpan, or urinal?: A Lot Help from another person bathing (including washing, rinsing, drying)?: A Lot Help from another person to put on and taking off regular upper body clothing?: A Little Help from another person to put on and taking off regular lower body clothing?: A Lot 6 Click Score: 16    End of Session Equipment Utilized During Treatment: Rolling walker (2 wheels)  OT Visit Diagnosis: Unsteadiness on feet (R26.81);Muscle weakness (generalized) (M62.81)   Activity Tolerance Patient tolerated treatment well   Patient Left in bed;with call bell/phone within reach;with bed alarm set   Nurse Communication  (pt nurse in room, communicated pt status to ED nurses at nurses station)        Time: 8101-7510 OT Time Calculation (min): 10 min  Charges: OT General Charges $OT Visit: 1 Visit OT Treatments $Self Care/Home Management : 8-22  mins  Shanon Payor, OTD OTR/L  12/16/22, 11:56 AM

## 2022-12-16 NOTE — ED Notes (Signed)
Pt received a lunch tray

## 2022-12-16 NOTE — TOC Progression Note (Signed)
Transition of Care [facility]) - Progression Note    Patient Details  Name: Zachary Pugh. MRN: [id number] Date of Birth: 06-15-1956  Transition of Care [facility]) CM/SW Contact  Shelbie Hutching, RN Phone Number: 12/16/2022, 4:22 PM  Clinical Narrative:    Medicaid application has been submitted, confirmation and copy secure emailed to Shaun with [facility].  RNCM has started [employer] through [place] for [place].  Niece, April, updated on discharge plans.    Expected Discharge Plan: [place] Barriers to Discharge: ED SNF auth  Expected Discharge Plan and Services   Discharge Planning Services: CM Consult Post Acute Care Choice: [place] Living arrangements for the past 2 months: Single Family Home                 DME Arranged: N/A DME Agency: NA       HH Arranged: NA HH Agency: NA         Social Determinants of Health (SDOH) Interventions [place]: No Food Insecurity (10/16/2022)  Housing: Low Risk  (10/16/2022)  Transportation Needs: No Transportation Needs (10/16/2022)  Utilities: Not At Risk (10/16/2022)  Tobacco Use: High Risk (12/10/2022)    Readmission Risk Interventions    10/25/2022    4:07 PM  Readmission Risk Prevention Plan  Transportation Screening Complete  PCP or Specialist Appt within 3-5 Days Complete  HRI or Home Care Consult Complete  Palliative Care Screening Not Applicable  Medication Review (RN Care Manager) Referral to Pharmacy

## 2022-12-17 DIAGNOSIS — G4089 Other seizures: Secondary | ICD-10-CM | POA: Diagnosis not present

## 2022-12-17 DIAGNOSIS — S72009A Fracture of unspecified part of neck of unspecified femur, initial encounter for closed fracture: Secondary | ICD-10-CM | POA: Diagnosis not present

## 2022-12-17 DIAGNOSIS — F1990 Other psychoactive substance use, unspecified, uncomplicated: Secondary | ICD-10-CM | POA: Diagnosis not present

## 2022-12-17 DIAGNOSIS — G40909 Epilepsy, unspecified, not intractable, without status epilepticus: Secondary | ICD-10-CM | POA: Diagnosis not present

## 2022-12-17 DIAGNOSIS — S72002D Fracture of unspecified part of neck of left femur, subsequent encounter for closed fracture with routine healing: Secondary | ICD-10-CM | POA: Diagnosis not present

## 2022-12-17 DIAGNOSIS — E876 Hypokalemia: Secondary | ICD-10-CM | POA: Diagnosis not present

## 2022-12-17 DIAGNOSIS — E46 Unspecified protein-calorie malnutrition: Secondary | ICD-10-CM | POA: Diagnosis not present

## 2022-12-17 DIAGNOSIS — S72002A Fracture of unspecified part of neck of left femur, initial encounter for closed fracture: Secondary | ICD-10-CM | POA: Diagnosis not present

## 2022-12-17 DIAGNOSIS — F1010 Alcohol abuse, uncomplicated: Secondary | ICD-10-CM | POA: Diagnosis not present

## 2022-12-17 DIAGNOSIS — F1911 Other psychoactive substance abuse, in remission: Secondary | ICD-10-CM | POA: Diagnosis not present

## 2022-12-17 DIAGNOSIS — R293 Abnormal posture: Secondary | ICD-10-CM | POA: Diagnosis not present

## 2022-12-17 DIAGNOSIS — G894 Chronic pain syndrome: Secondary | ICD-10-CM | POA: Diagnosis not present

## 2022-12-17 DIAGNOSIS — I959 Hypotension, unspecified: Secondary | ICD-10-CM | POA: Diagnosis not present

## 2022-12-17 DIAGNOSIS — R269 Unspecified abnormalities of gait and mobility: Secondary | ICD-10-CM | POA: Diagnosis not present

## 2022-12-17 DIAGNOSIS — R4182 Altered mental status, unspecified: Secondary | ICD-10-CM | POA: Diagnosis not present

## 2022-12-17 DIAGNOSIS — E871 Hypo-osmolality and hyponatremia: Secondary | ICD-10-CM | POA: Diagnosis not present

## 2022-12-17 DIAGNOSIS — R41841 Cognitive communication deficit: Secondary | ICD-10-CM | POA: Diagnosis not present

## 2022-12-17 DIAGNOSIS — Z91148 Patient's other noncompliance with medication regimen for other reason: Secondary | ICD-10-CM | POA: Diagnosis not present

## 2022-12-17 DIAGNOSIS — Z5900 Homelessness unspecified: Secondary | ICD-10-CM | POA: Diagnosis not present

## 2022-12-17 DIAGNOSIS — Z9181 History of falling: Secondary | ICD-10-CM | POA: Diagnosis not present

## 2022-12-17 DIAGNOSIS — E43 Unspecified severe protein-calorie malnutrition: Secondary | ICD-10-CM | POA: Diagnosis not present

## 2022-12-17 DIAGNOSIS — M6281 Muscle weakness (generalized): Secondary | ICD-10-CM | POA: Diagnosis not present

## 2022-12-17 DIAGNOSIS — K219 Gastro-esophageal reflux disease without esophagitis: Secondary | ICD-10-CM | POA: Diagnosis not present

## 2022-12-17 DIAGNOSIS — Z8673 Personal history of transient ischemic attack (TIA), and cerebral infarction without residual deficits: Secondary | ICD-10-CM | POA: Diagnosis not present

## 2022-12-17 DIAGNOSIS — Z743 Need for continuous supervision: Secondary | ICD-10-CM | POA: Diagnosis not present

## 2022-12-17 NOTE — ED Notes (Signed)
[facility]  called  for  transport  to  [facility]

## 2022-12-17 NOTE — ED Provider Notes (Signed)
-----------------------------------------   7:29 AM on 12/17/2022 -----------------------------------------   Blood pressure 128/67, pulse 61, temperature 98 F (36.7 C), temperature source Oral, resp. rate 16, height 1.753 m (5\' 9" ), weight 54.4 kg, SpO2 96 %.  The patient is calm and cooperative at this time.  There have been no acute events since the last update.  Awaiting disposition plan from [facility] team.   Hinda Kehr, MD 12/17/22 [phone]

## 2022-12-17 NOTE — TOC Progression Note (Signed)
Transition of Care [facility]) - Progression Note    Patient Details  Name: Zachary Pugh. MRN: [id number] Date of Birth: 03-14-1956  Transition of Care [facility]) CM/SW Contact  Shelbie Hutching, RN Phone Number: 12/17/2022, 9:19 AM  Clinical Narrative:    Insurance authorization has been approved for patient to go to [facility].  Auth # [id number], reference # [id number]- approved from 1/5- 1/9, next review on 1/9.  Message left for Whitney with [facility] to see what room patient will be going to.     Expected Discharge Plan: [place] Barriers to Discharge: ED SNF auth  Expected Discharge Plan and Services   Discharge Planning Services: CM Consult Post Acute Care Choice: [place] Living arrangements for the past 2 months: Single Family Home                 DME Arranged: N/A DME Agency: NA       HH Arranged: NA HH Agency: NA         Social Determinants of Health (SDOH) Interventions [place]: No Food Insecurity (10/16/2022)  Housing: Low Risk  (10/16/2022)  Transportation Needs: No Transportation Needs (10/16/2022)  Utilities: Not At Risk (10/16/2022)  Tobacco Use: High Risk (12/10/2022)    Readmission Risk Interventions    10/25/2022    4:07 PM  Readmission Risk Prevention Plan  Transportation Screening Complete  PCP or Specialist Appt within 3-5 Days Complete  HRI or Home Care Consult Complete  Palliative Care Screening Not Applicable  Medication Review (RN Care Manager) Referral to Pharmacy

## 2022-12-17 NOTE — Discharge Instructions (Signed)
Please go to the following website to schedule new (and existing) patient appointments:   [email or web address]   The following is a list of primary care offices in the area who are accepting new patients at this time.  Please reach out to one of them directly and let them know you would like to schedule an appointment to follow up on an Emergency Department visit, and/or to establish a new primary care provider (PCP).  There are likely other primary care clinics in the are who are accepting new patients, but this is an excellent place to start:  [facility] Lead physician: Dr Angela Bacigalupo [address] [place], [place] [address] [phone]  [facility] Lead Physician: Dr Krichna Sowles [address], [place], [place] [address] [phone]  [facility]  Lead Physician: Dr Megan Johnson [address], [place], [place] [address] [phone]  [facility] [place] [facility] Lead Physician: Dr Alex Karamalegos [address], [place], [place] [address] [phone]  [facility] [place] Lead Physician: Dr Laura Berglund [address], [place], [place] [address] [phone]   

## 2022-12-17 NOTE — TOC Transition Note (Signed)
Transition of Care [facility]) - CM/SW Discharge Note   Patient Details  Name: Zachary Pugh. MRN: [id number] Date of Birth: 06-10-1956  Transition of Care [facility]) CM/SW Contact:  Shelbie Hutching, RN Phone Number: 12/17/2022, 1:13 PM   Clinical Narrative:    Patient discharging to [facility] today, going to room A5 bed 1.  Bedside RN will call report to [phone].  ED secretary will arrange for EMS transport.  [facility] requested for transport not to arrive before 2 pm as patient's bed should be ready by 2:30 pm.  Niece April called and updated on discharge today.     Final next level of care: Skilled Nursing Facility Barriers to Discharge: Barriers Resolved   Patient Goals and CMS Choice CMS Medicare.gov Compare Post Acute Care list provided to:: Patient Represenative (must comment) Choice offered to / list presented to : [place] / [place]  Discharge Placement                Patient chooses bed at: Other - please specify in the comment section below: [facility]) Patient to be transferred to facility by: [facility] Name of family member notified: April Patient and family notified of of transfer: 12/17/22  Discharge Plan and Services Additional resources added to the After Visit Summary for     Discharge Planning Services: CM Consult Post Acute Care Choice: [place]          DME Arranged: N/A DME Agency: NA       HH Arranged: NA HH Agency: NA        Social Determinants of Health ([place]) Interventions [place]: No Food Insecurity (10/16/2022)  Housing: Low Risk  (10/16/2022)  Transportation Needs: No Transportation Needs (10/16/2022)  Utilities: Not At Risk (10/16/2022)  Tobacco Use: High Risk (12/10/2022)     Readmission Risk Interventions    10/25/2022    4:07 PM  Readmission Risk Prevention Plan  Transportation Screening Complete  PCP or Specialist Appt within 3-5 Days Complete  HRI or [place] Complete  Palliative Care Screening Not Applicable  Medication Review (RN Care Manager) Referral to Pharmacy

## 2022-12-17 NOTE — Progress Notes (Signed)
Physical Therapy Treatment Patient Details Name: Zachary Pugh. MRN: [id number] DOB: May 14, 1956 Today's Date: 12/17/2022   History of Present Illness Zachary [place]. is a 67 y.o. male with a history of seizure disorder brought into the ER by family for placement.  Patient's primary care taker just recently died.  The patient's niece and nephew are now helping arrange care for the patient but found him in a house without running water or heat.  He was in a very poor state condition.  APS is involved.  They are currently working on placement.  Poorly has not been taking his medications.  States he has had some falls.  Does have reported history of substance abuse but denies any use recently.    PT Comments    Pt received upright in bed agreeable to PT services. PT attempting to educate pt on use of SPC as pt making good progress with RW without heavy reliance for support. Pt unable to display understanding of sequencing SPC in standing despite PT demo prior and mod multimodal cuing. Due to cognition did attempt gait ~5' without AD with significant antalgic gait on LLE, limited stance time requiring minguard to ambulate to RW. With RW pt ambulating ~200' with intermittent supervision to minguard and min to mod VC's for RW proximity and scanning environment as pt stares down at his feet leading to frequent need for PT assist for obstacle navigation. Pt's cognition remains biggest safety concern with OOB mobility but remains pleasant and motivated to improve. Is also tolerating light, manual resistive therex to LLE to progress strength gains. Pt upright in bed with all needs in reach.    Recommendations for follow up therapy are one component of a multi-disciplinary discharge planning process, led by the attending physician.  Recommendations may be updated based on patient status, additional functional criteria and insurance authorization.  Follow Up Recommendations  Other (comment) (will need 24/7  assistance/supervision 2/2 to cognition and safety concerns. pt has proven unable to care for himself in previous admissions.) Can patient physically be transported by private vehicle: Yes   Assistance Recommended at Discharge Frequent or constant Supervision/Assistance  Patient can return home with the following A little help with walking and/or transfers;A little help with bathing/dressing/bathroom;Assistance with cooking/housework;Assistance with feeding;Direct supervision/assist for medications management;Direct supervision/assist for financial management;Assist for transportation;Help with stairs or ramp for entrance   Equipment Recommendations  Rolling walker (2 wheels)    Recommendations for Other Services       Precautions / Restrictions Precautions Precautions: Fall Precaution Comments: L hip ORIF on 10/16/22. Restrictions Weight Bearing Restrictions: Yes LLE Weight Bearing: Weight bearing as tolerated Other Position/Activity Restrictions: per Dr Raliegh Ip note ok for WBAT     Mobility  Bed Mobility Overal bed mobility: Modified Independent Bed Mobility: Supine to Sit, Sit to Supine     Supine to sit: Modified independent (Device/Increase time) Sit to supine: Modified independent (Device/Increase time)     Patient Response: Cooperative  Transfers Overall transfer level: Needs assistance Equipment used: Straight cane Transfers: Sit to/from Stand Sit to Stand: Min guard                Ambulation/Gait Ambulation/Gait assistance: Supervision, Min guard Gait Distance (Feet): 200 Feet Assistive device: Rolling walker (2 wheels), Straight cane Gait Pattern/deviations: Step-through pattern, Antalgic, Step-to pattern       General Gait Details: Attempted gait with SPC. Pt does not have cognition to attempt safely. Antalgic step to gait ~ 5' to RW.  Continues to need VC's for safe use of RW. Initiating consistent step through gait with more fluidity.   Stairs              Wheelchair Mobility    Modified Rankin (Stroke Patients Only)       Balance Overall balance assessment: Needs assistance Sitting-balance support: Bilateral upper extremity supported, Feet supported Sitting balance-Leahy Scale: Good       Standing balance-Leahy Scale: Fair Standing balance comment: can stand without UE support                            Cognition Arousal/Alertness: Awake/alert Behavior During Therapy: WFL for tasks assessed/performed Overall Cognitive Status: No family/caregiver present to determine baseline cognitive functioning                                          Exercises General Exercises - Lower Extremity Short Arc Quad: AROM, Strengthening, Supine, 15 reps, Left (with light, manual resistance) Hip ABduction/ADduction: Strengthening, Left, 15 reps, Supine (with light manual resistance.) Straight Leg Raises: AROM, Strengthening, Supine, Left, 15 reps (light manual resistance.) Other Exercises Other Exercises: VC's for scanning environment    General Comments        Pertinent Vitals/Pain Pain Assessment Pain Assessment: No/denies pain    Home Living                          Prior Function            PT Goals (current goals can now be found in the care plan section) Acute Rehab PT Goals Patient Stated Goal: none stated    Frequency    Min 2X/week      PT Plan Current plan remains appropriate    Co-evaluation              AM-PAC PT "6 Clicks" Mobility   Outcome Measure  Help needed turning from your back to your side while in a flat bed without using bedrails?: A Little Help needed moving from lying on your back to sitting on the side of a flat bed without using bedrails?: A Little Help needed moving to and from a bed to a chair (including a wheelchair)?: A Little Help needed standing up from a chair using your arms (e.g., wheelchair or bedside chair)?: A Little Help  needed to walk in hospital room?: A Little Help needed climbing 3-5 steps with a railing? : A Little 6 Click Score: 18    End of Session Equipment Utilized During Treatment: Gait belt Activity Tolerance: Patient tolerated treatment well Patient left: in bed;with call bell/phone within reach;with bed alarm set Nurse Communication: Mobility status PT Visit Diagnosis: Unsteadiness on feet (R26.81);Muscle weakness (generalized) (M62.81);History of falling (Z91.81);Difficulty in walking, not elsewhere classified (R26.2)     Time: 7616-0737 PT Time Calculation (min) (ACUTE ONLY): 25 min  Charges:  $Gait Training: 8-22 mins $Therapeutic Exercise: 8-22 mins                    Haadi Santellan M. Fairly IV, PT, DPT Physical Therapist- [facility]  12/17/2022, 12:23 PM

## 2022-12-17 NOTE — ED Provider Notes (Signed)
Patient will be discharged to SNF today. He has been HDS and remained medically stable. He is nontoxic and well appearing, in no distress. Normal WOB. In agreement with plan and SW has discussed w/ guardian.   Duffy Bruce, MD 12/17/22 1226

## 2022-12-17 NOTE — ED Notes (Signed)
Report given to [facility] by [employer]

## 2022-12-17 NOTE — ED Notes (Signed)
Pt ambulates to toilet with walker. No other needs expressed at this time

## 2022-12-21 ENCOUNTER — Telehealth: Payer: Self-pay

## 2022-12-21 NOTE — Telephone Encounter (Signed)
        Patient  visited [place] on 1/5     Telephone encounter attempt :  1st  Invalid number     [place], [facility]  [phone] [address], [place], [place] [address] Phone: [phone] Email: [email or web address]

## 2022-12-23 DIAGNOSIS — G894 Chronic pain syndrome: Secondary | ICD-10-CM | POA: Diagnosis not present

## 2022-12-23 DIAGNOSIS — K219 Gastro-esophageal reflux disease without esophagitis: Secondary | ICD-10-CM | POA: Diagnosis not present

## 2022-12-23 DIAGNOSIS — F1990 Other psychoactive substance use, unspecified, uncomplicated: Secondary | ICD-10-CM | POA: Diagnosis not present

## 2022-12-23 DIAGNOSIS — M6281 Muscle weakness (generalized): Secondary | ICD-10-CM | POA: Diagnosis not present

## 2022-12-23 DIAGNOSIS — G40909 Epilepsy, unspecified, not intractable, without status epilepticus: Secondary | ICD-10-CM | POA: Diagnosis not present

## 2022-12-25 DIAGNOSIS — E46 Unspecified protein-calorie malnutrition: Secondary | ICD-10-CM | POA: Diagnosis not present

## 2022-12-27 DIAGNOSIS — S72002A Fracture of unspecified part of neck of left femur, initial encounter for closed fracture: Secondary | ICD-10-CM | POA: Diagnosis not present

## 2022-12-27 DIAGNOSIS — S72009A Fracture of unspecified part of neck of unspecified femur, initial encounter for closed fracture: Secondary | ICD-10-CM | POA: Diagnosis not present

## 2023-01-01 DIAGNOSIS — M6281 Muscle weakness (generalized): Secondary | ICD-10-CM | POA: Diagnosis not present

## 2023-01-01 DIAGNOSIS — F1990 Other psychoactive substance use, unspecified, uncomplicated: Secondary | ICD-10-CM | POA: Diagnosis not present

## 2023-01-01 DIAGNOSIS — G894 Chronic pain syndrome: Secondary | ICD-10-CM | POA: Diagnosis not present

## 2023-01-01 DIAGNOSIS — E46 Unspecified protein-calorie malnutrition: Secondary | ICD-10-CM | POA: Diagnosis not present

## 2023-01-01 DIAGNOSIS — G40909 Epilepsy, unspecified, not intractable, without status epilepticus: Secondary | ICD-10-CM | POA: Diagnosis not present

## 2023-01-01 DIAGNOSIS — K219 Gastro-esophageal reflux disease without esophagitis: Secondary | ICD-10-CM | POA: Diagnosis not present

## 2023-01-04 DIAGNOSIS — R293 Abnormal posture: Secondary | ICD-10-CM | POA: Diagnosis not present

## 2023-01-04 DIAGNOSIS — R269 Unspecified abnormalities of gait and mobility: Secondary | ICD-10-CM | POA: Diagnosis not present

## 2023-01-04 DIAGNOSIS — M6281 Muscle weakness (generalized): Secondary | ICD-10-CM | POA: Diagnosis not present

## 2023-01-04 DIAGNOSIS — E43 Unspecified severe protein-calorie malnutrition: Secondary | ICD-10-CM | POA: Diagnosis not present

## 2023-01-06 DIAGNOSIS — M6281 Muscle weakness (generalized): Secondary | ICD-10-CM | POA: Diagnosis not present

## 2023-01-06 DIAGNOSIS — R293 Abnormal posture: Secondary | ICD-10-CM | POA: Diagnosis not present

## 2023-01-06 DIAGNOSIS — E43 Unspecified severe protein-calorie malnutrition: Secondary | ICD-10-CM | POA: Diagnosis not present

## 2023-01-06 DIAGNOSIS — R269 Unspecified abnormalities of gait and mobility: Secondary | ICD-10-CM | POA: Diagnosis not present

## 2023-01-07 DIAGNOSIS — R269 Unspecified abnormalities of gait and mobility: Secondary | ICD-10-CM | POA: Diagnosis not present

## 2023-01-07 DIAGNOSIS — M6281 Muscle weakness (generalized): Secondary | ICD-10-CM | POA: Diagnosis not present

## 2023-01-07 DIAGNOSIS — E43 Unspecified severe protein-calorie malnutrition: Secondary | ICD-10-CM | POA: Diagnosis not present

## 2023-01-07 DIAGNOSIS — E46 Unspecified protein-calorie malnutrition: Secondary | ICD-10-CM | POA: Diagnosis not present

## 2023-01-07 DIAGNOSIS — K219 Gastro-esophageal reflux disease without esophagitis: Secondary | ICD-10-CM | POA: Diagnosis not present

## 2023-01-07 DIAGNOSIS — F419 Anxiety disorder, unspecified: Secondary | ICD-10-CM | POA: Diagnosis not present

## 2023-01-07 DIAGNOSIS — G4700 Insomnia, unspecified: Secondary | ICD-10-CM | POA: Diagnosis not present

## 2023-01-07 DIAGNOSIS — R293 Abnormal posture: Secondary | ICD-10-CM | POA: Diagnosis not present

## 2023-01-07 DIAGNOSIS — G40909 Epilepsy, unspecified, not intractable, without status epilepticus: Secondary | ICD-10-CM | POA: Diagnosis not present

## 2023-01-07 DIAGNOSIS — F1990 Other psychoactive substance use, unspecified, uncomplicated: Secondary | ICD-10-CM | POA: Diagnosis not present

## 2023-01-07 DIAGNOSIS — G894 Chronic pain syndrome: Secondary | ICD-10-CM | POA: Diagnosis not present

## 2023-01-11 DIAGNOSIS — R269 Unspecified abnormalities of gait and mobility: Secondary | ICD-10-CM | POA: Diagnosis not present

## 2023-01-11 DIAGNOSIS — M6281 Muscle weakness (generalized): Secondary | ICD-10-CM | POA: Diagnosis not present

## 2023-01-11 DIAGNOSIS — E43 Unspecified severe protein-calorie malnutrition: Secondary | ICD-10-CM | POA: Diagnosis not present

## 2023-01-11 DIAGNOSIS — R293 Abnormal posture: Secondary | ICD-10-CM | POA: Diagnosis not present

## 2023-01-12 DIAGNOSIS — K219 Gastro-esophageal reflux disease without esophagitis: Secondary | ICD-10-CM | POA: Diagnosis not present

## 2023-01-12 DIAGNOSIS — G4700 Insomnia, unspecified: Secondary | ICD-10-CM | POA: Diagnosis not present

## 2023-01-12 DIAGNOSIS — F419 Anxiety disorder, unspecified: Secondary | ICD-10-CM | POA: Diagnosis not present

## 2023-01-12 DIAGNOSIS — G40909 Epilepsy, unspecified, not intractable, without status epilepticus: Secondary | ICD-10-CM | POA: Diagnosis not present

## 2023-01-12 DIAGNOSIS — E46 Unspecified protein-calorie malnutrition: Secondary | ICD-10-CM | POA: Diagnosis not present

## 2023-01-12 DIAGNOSIS — G894 Chronic pain syndrome: Secondary | ICD-10-CM | POA: Diagnosis not present

## 2023-01-12 DIAGNOSIS — F1990 Other psychoactive substance use, unspecified, uncomplicated: Secondary | ICD-10-CM | POA: Diagnosis not present

## 2023-01-12 DIAGNOSIS — M6281 Muscle weakness (generalized): Secondary | ICD-10-CM | POA: Diagnosis not present

## 2023-01-17 DIAGNOSIS — S72002D Fracture of unspecified part of neck of left femur, subsequent encounter for closed fracture with routine healing: Secondary | ICD-10-CM | POA: Diagnosis not present

## 2023-01-17 DIAGNOSIS — R4189 Other symptoms and signs involving cognitive functions and awareness: Secondary | ICD-10-CM | POA: Diagnosis not present

## 2023-01-17 DIAGNOSIS — M6281 Muscle weakness (generalized): Secondary | ICD-10-CM | POA: Diagnosis not present

## 2023-01-17 DIAGNOSIS — G40909 Epilepsy, unspecified, not intractable, without status epilepticus: Secondary | ICD-10-CM | POA: Diagnosis not present

## 2023-01-17 DIAGNOSIS — R262 Difficulty in walking, not elsewhere classified: Secondary | ICD-10-CM | POA: Diagnosis not present

## 2023-01-17 DIAGNOSIS — G894 Chronic pain syndrome: Secondary | ICD-10-CM | POA: Diagnosis not present

## 2023-01-17 DIAGNOSIS — K219 Gastro-esophageal reflux disease without esophagitis: Secondary | ICD-10-CM | POA: Diagnosis not present

## 2023-01-18 DIAGNOSIS — M6281 Muscle weakness (generalized): Secondary | ICD-10-CM | POA: Diagnosis not present

## 2023-01-18 DIAGNOSIS — K219 Gastro-esophageal reflux disease without esophagitis: Secondary | ICD-10-CM | POA: Diagnosis not present

## 2023-01-18 DIAGNOSIS — E43 Unspecified severe protein-calorie malnutrition: Secondary | ICD-10-CM | POA: Diagnosis not present

## 2023-01-18 DIAGNOSIS — G40909 Epilepsy, unspecified, not intractable, without status epilepticus: Secondary | ICD-10-CM | POA: Diagnosis not present

## 2023-01-20 DIAGNOSIS — M6281 Muscle weakness (generalized): Secondary | ICD-10-CM | POA: Diagnosis not present

## 2023-01-20 DIAGNOSIS — S72002D Fracture of unspecified part of neck of left femur, subsequent encounter for closed fracture with routine healing: Secondary | ICD-10-CM | POA: Diagnosis not present

## 2023-01-20 DIAGNOSIS — R262 Difficulty in walking, not elsewhere classified: Secondary | ICD-10-CM | POA: Diagnosis not present

## 2023-01-21 DIAGNOSIS — G894 Chronic pain syndrome: Secondary | ICD-10-CM | POA: Diagnosis not present

## 2023-01-21 DIAGNOSIS — M6281 Muscle weakness (generalized): Secondary | ICD-10-CM | POA: Diagnosis not present

## 2023-01-21 DIAGNOSIS — S72002D Fracture of unspecified part of neck of left femur, subsequent encounter for closed fracture with routine healing: Secondary | ICD-10-CM | POA: Diagnosis not present

## 2023-01-21 DIAGNOSIS — E46 Unspecified protein-calorie malnutrition: Secondary | ICD-10-CM | POA: Diagnosis not present

## 2023-01-21 DIAGNOSIS — R262 Difficulty in walking, not elsewhere classified: Secondary | ICD-10-CM | POA: Diagnosis not present

## 2023-01-24 DIAGNOSIS — M6281 Muscle weakness (generalized): Secondary | ICD-10-CM | POA: Diagnosis not present

## 2023-01-24 DIAGNOSIS — S72002D Fracture of unspecified part of neck of left femur, subsequent encounter for closed fracture with routine healing: Secondary | ICD-10-CM | POA: Diagnosis not present

## 2023-01-24 DIAGNOSIS — R262 Difficulty in walking, not elsewhere classified: Secondary | ICD-10-CM | POA: Diagnosis not present

## 2023-01-25 DIAGNOSIS — R262 Difficulty in walking, not elsewhere classified: Secondary | ICD-10-CM | POA: Diagnosis not present

## 2023-01-25 DIAGNOSIS — S72002D Fracture of unspecified part of neck of left femur, subsequent encounter for closed fracture with routine healing: Secondary | ICD-10-CM | POA: Diagnosis not present

## 2023-01-25 DIAGNOSIS — M6281 Muscle weakness (generalized): Secondary | ICD-10-CM | POA: Diagnosis not present

## 2023-01-26 DIAGNOSIS — S72002D Fracture of unspecified part of neck of left femur, subsequent encounter for closed fracture with routine healing: Secondary | ICD-10-CM | POA: Diagnosis not present

## 2023-01-26 DIAGNOSIS — R262 Difficulty in walking, not elsewhere classified: Secondary | ICD-10-CM | POA: Diagnosis not present

## 2023-01-26 DIAGNOSIS — M6281 Muscle weakness (generalized): Secondary | ICD-10-CM | POA: Diagnosis not present

## 2023-02-01 DIAGNOSIS — R262 Difficulty in walking, not elsewhere classified: Secondary | ICD-10-CM | POA: Diagnosis not present

## 2023-02-01 DIAGNOSIS — S72002D Fracture of unspecified part of neck of left femur, subsequent encounter for closed fracture with routine healing: Secondary | ICD-10-CM | POA: Diagnosis not present

## 2023-02-01 DIAGNOSIS — M6281 Muscle weakness (generalized): Secondary | ICD-10-CM | POA: Diagnosis not present

## 2023-02-02 DIAGNOSIS — G3184 Mild cognitive impairment, so stated: Secondary | ICD-10-CM | POA: Diagnosis not present

## 2023-02-02 DIAGNOSIS — E46 Unspecified protein-calorie malnutrition: Secondary | ICD-10-CM | POA: Diagnosis not present

## 2023-02-02 DIAGNOSIS — G40909 Epilepsy, unspecified, not intractable, without status epilepticus: Secondary | ICD-10-CM | POA: Diagnosis not present

## 2023-02-03 DIAGNOSIS — E43 Unspecified severe protein-calorie malnutrition: Secondary | ICD-10-CM | POA: Diagnosis not present

## 2023-02-03 DIAGNOSIS — K219 Gastro-esophageal reflux disease without esophagitis: Secondary | ICD-10-CM | POA: Diagnosis not present

## 2023-02-03 DIAGNOSIS — G40909 Epilepsy, unspecified, not intractable, without status epilepticus: Secondary | ICD-10-CM | POA: Diagnosis not present

## 2023-02-03 DIAGNOSIS — R262 Difficulty in walking, not elsewhere classified: Secondary | ICD-10-CM | POA: Diagnosis not present

## 2023-02-03 DIAGNOSIS — S72002D Fracture of unspecified part of neck of left femur, subsequent encounter for closed fracture with routine healing: Secondary | ICD-10-CM | POA: Diagnosis not present

## 2023-02-03 DIAGNOSIS — M6281 Muscle weakness (generalized): Secondary | ICD-10-CM | POA: Diagnosis not present

## 2023-02-04 DIAGNOSIS — S72002D Fracture of unspecified part of neck of left femur, subsequent encounter for closed fracture with routine healing: Secondary | ICD-10-CM | POA: Diagnosis not present

## 2023-02-04 DIAGNOSIS — G894 Chronic pain syndrome: Secondary | ICD-10-CM | POA: Diagnosis not present

## 2023-02-04 DIAGNOSIS — R262 Difficulty in walking, not elsewhere classified: Secondary | ICD-10-CM | POA: Diagnosis not present

## 2023-02-04 DIAGNOSIS — M6281 Muscle weakness (generalized): Secondary | ICD-10-CM | POA: Diagnosis not present

## 2023-02-08 DIAGNOSIS — M6281 Muscle weakness (generalized): Secondary | ICD-10-CM | POA: Diagnosis not present

## 2023-02-08 DIAGNOSIS — S72002D Fracture of unspecified part of neck of left femur, subsequent encounter for closed fracture with routine healing: Secondary | ICD-10-CM | POA: Diagnosis not present

## 2023-02-08 DIAGNOSIS — R262 Difficulty in walking, not elsewhere classified: Secondary | ICD-10-CM | POA: Diagnosis not present

## 2023-02-09 DIAGNOSIS — R262 Difficulty in walking, not elsewhere classified: Secondary | ICD-10-CM | POA: Diagnosis not present

## 2023-02-09 DIAGNOSIS — S72002D Fracture of unspecified part of neck of left femur, subsequent encounter for closed fracture with routine healing: Secondary | ICD-10-CM | POA: Diagnosis not present

## 2023-02-09 DIAGNOSIS — M6281 Muscle weakness (generalized): Secondary | ICD-10-CM | POA: Diagnosis not present

## 2023-02-10 DIAGNOSIS — S72002D Fracture of unspecified part of neck of left femur, subsequent encounter for closed fracture with routine healing: Secondary | ICD-10-CM | POA: Diagnosis not present

## 2023-02-10 DIAGNOSIS — M6281 Muscle weakness (generalized): Secondary | ICD-10-CM | POA: Diagnosis not present

## 2023-02-10 DIAGNOSIS — R262 Difficulty in walking, not elsewhere classified: Secondary | ICD-10-CM | POA: Diagnosis not present

## 2023-02-14 DIAGNOSIS — F419 Anxiety disorder, unspecified: Secondary | ICD-10-CM | POA: Diagnosis not present

## 2023-02-14 DIAGNOSIS — R4189 Other symptoms and signs involving cognitive functions and awareness: Secondary | ICD-10-CM | POA: Diagnosis not present

## 2023-02-14 DIAGNOSIS — K219 Gastro-esophageal reflux disease without esophagitis: Secondary | ICD-10-CM | POA: Diagnosis not present

## 2023-02-14 DIAGNOSIS — E46 Unspecified protein-calorie malnutrition: Secondary | ICD-10-CM | POA: Diagnosis not present

## 2023-02-14 DIAGNOSIS — M6281 Muscle weakness (generalized): Secondary | ICD-10-CM | POA: Diagnosis not present

## 2023-02-14 DIAGNOSIS — G40909 Epilepsy, unspecified, not intractable, without status epilepticus: Secondary | ICD-10-CM | POA: Diagnosis not present

## 2023-02-14 DIAGNOSIS — G894 Chronic pain syndrome: Secondary | ICD-10-CM | POA: Diagnosis not present

## 2023-02-16 DIAGNOSIS — R262 Difficulty in walking, not elsewhere classified: Secondary | ICD-10-CM | POA: Diagnosis not present

## 2023-02-16 DIAGNOSIS — M6281 Muscle weakness (generalized): Secondary | ICD-10-CM | POA: Diagnosis not present

## 2023-02-16 DIAGNOSIS — G40909 Epilepsy, unspecified, not intractable, without status epilepticus: Secondary | ICD-10-CM | POA: Diagnosis not present

## 2023-02-16 DIAGNOSIS — S72002D Fracture of unspecified part of neck of left femur, subsequent encounter for closed fracture with routine healing: Secondary | ICD-10-CM | POA: Diagnosis not present

## 2023-02-16 DIAGNOSIS — Z741 Need for assistance with personal care: Secondary | ICD-10-CM | POA: Diagnosis not present

## 2023-02-17 DIAGNOSIS — M6281 Muscle weakness (generalized): Secondary | ICD-10-CM | POA: Diagnosis not present

## 2023-02-17 DIAGNOSIS — G40909 Epilepsy, unspecified, not intractable, without status epilepticus: Secondary | ICD-10-CM | POA: Diagnosis not present

## 2023-02-17 DIAGNOSIS — Z741 Need for assistance with personal care: Secondary | ICD-10-CM | POA: Diagnosis not present

## 2023-02-17 DIAGNOSIS — R262 Difficulty in walking, not elsewhere classified: Secondary | ICD-10-CM | POA: Diagnosis not present

## 2023-02-17 DIAGNOSIS — S72002D Fracture of unspecified part of neck of left femur, subsequent encounter for closed fracture with routine healing: Secondary | ICD-10-CM | POA: Diagnosis not present

## 2023-02-19 DIAGNOSIS — R262 Difficulty in walking, not elsewhere classified: Secondary | ICD-10-CM | POA: Diagnosis not present

## 2023-02-19 DIAGNOSIS — S72002D Fracture of unspecified part of neck of left femur, subsequent encounter for closed fracture with routine healing: Secondary | ICD-10-CM | POA: Diagnosis not present

## 2023-02-19 DIAGNOSIS — Z741 Need for assistance with personal care: Secondary | ICD-10-CM | POA: Diagnosis not present

## 2023-02-19 DIAGNOSIS — G40909 Epilepsy, unspecified, not intractable, without status epilepticus: Secondary | ICD-10-CM | POA: Diagnosis not present

## 2023-02-19 DIAGNOSIS — M6281 Muscle weakness (generalized): Secondary | ICD-10-CM | POA: Diagnosis not present

## 2023-02-21 DIAGNOSIS — R262 Difficulty in walking, not elsewhere classified: Secondary | ICD-10-CM | POA: Diagnosis not present

## 2023-02-21 DIAGNOSIS — M6281 Muscle weakness (generalized): Secondary | ICD-10-CM | POA: Diagnosis not present

## 2023-02-21 DIAGNOSIS — G40909 Epilepsy, unspecified, not intractable, without status epilepticus: Secondary | ICD-10-CM | POA: Diagnosis not present

## 2023-02-21 DIAGNOSIS — S72002D Fracture of unspecified part of neck of left femur, subsequent encounter for closed fracture with routine healing: Secondary | ICD-10-CM | POA: Diagnosis not present

## 2023-02-21 DIAGNOSIS — Z741 Need for assistance with personal care: Secondary | ICD-10-CM | POA: Diagnosis not present

## 2023-02-22 DIAGNOSIS — S72002D Fracture of unspecified part of neck of left femur, subsequent encounter for closed fracture with routine healing: Secondary | ICD-10-CM | POA: Diagnosis not present

## 2023-02-22 DIAGNOSIS — M6281 Muscle weakness (generalized): Secondary | ICD-10-CM | POA: Diagnosis not present

## 2023-02-22 DIAGNOSIS — R262 Difficulty in walking, not elsewhere classified: Secondary | ICD-10-CM | POA: Diagnosis not present

## 2023-02-22 DIAGNOSIS — Z741 Need for assistance with personal care: Secondary | ICD-10-CM | POA: Diagnosis not present

## 2023-02-22 DIAGNOSIS — E43 Unspecified severe protein-calorie malnutrition: Secondary | ICD-10-CM | POA: Diagnosis not present

## 2023-02-22 DIAGNOSIS — G40909 Epilepsy, unspecified, not intractable, without status epilepticus: Secondary | ICD-10-CM | POA: Diagnosis not present

## 2023-02-25 DIAGNOSIS — G40909 Epilepsy, unspecified, not intractable, without status epilepticus: Secondary | ICD-10-CM | POA: Diagnosis not present

## 2023-02-28 DIAGNOSIS — F430 Acute stress reaction: Secondary | ICD-10-CM | POA: Diagnosis not present

## 2023-03-04 DIAGNOSIS — M1990 Unspecified osteoarthritis, unspecified site: Secondary | ICD-10-CM | POA: Diagnosis not present

## 2023-03-04 DIAGNOSIS — M79675 Pain in left toe(s): Secondary | ICD-10-CM | POA: Diagnosis not present

## 2023-03-04 DIAGNOSIS — L851 Acquired keratosis [keratoderma] palmaris et plantaris: Secondary | ICD-10-CM | POA: Diagnosis not present

## 2023-03-04 DIAGNOSIS — M79674 Pain in right toe(s): Secondary | ICD-10-CM | POA: Diagnosis not present

## 2023-03-04 DIAGNOSIS — B351 Tinea unguium: Secondary | ICD-10-CM | POA: Diagnosis not present

## 2023-03-04 DIAGNOSIS — R2681 Unsteadiness on feet: Secondary | ICD-10-CM | POA: Diagnosis not present

## 2023-03-04 DIAGNOSIS — L603 Nail dystrophy: Secondary | ICD-10-CM | POA: Diagnosis not present

## 2023-03-08 DIAGNOSIS — K219 Gastro-esophageal reflux disease without esophagitis: Secondary | ICD-10-CM | POA: Diagnosis not present

## 2023-03-08 DIAGNOSIS — E46 Unspecified protein-calorie malnutrition: Secondary | ICD-10-CM | POA: Diagnosis not present

## 2023-03-08 DIAGNOSIS — F419 Anxiety disorder, unspecified: Secondary | ICD-10-CM | POA: Diagnosis not present

## 2023-03-08 DIAGNOSIS — G40909 Epilepsy, unspecified, not intractable, without status epilepticus: Secondary | ICD-10-CM | POA: Diagnosis not present

## 2023-03-08 DIAGNOSIS — R4189 Other symptoms and signs involving cognitive functions and awareness: Secondary | ICD-10-CM | POA: Diagnosis not present

## 2023-03-08 DIAGNOSIS — G894 Chronic pain syndrome: Secondary | ICD-10-CM | POA: Diagnosis not present

## 2023-03-08 DIAGNOSIS — M6281 Muscle weakness (generalized): Secondary | ICD-10-CM | POA: Diagnosis not present

## 2023-03-09 DIAGNOSIS — Z741 Need for assistance with personal care: Secondary | ICD-10-CM | POA: Diagnosis not present

## 2023-03-09 DIAGNOSIS — R262 Difficulty in walking, not elsewhere classified: Secondary | ICD-10-CM | POA: Diagnosis not present

## 2023-03-09 DIAGNOSIS — S72002D Fracture of unspecified part of neck of left femur, subsequent encounter for closed fracture with routine healing: Secondary | ICD-10-CM | POA: Diagnosis not present

## 2023-03-09 DIAGNOSIS — M6281 Muscle weakness (generalized): Secondary | ICD-10-CM | POA: Diagnosis not present

## 2023-03-09 DIAGNOSIS — F430 Acute stress reaction: Secondary | ICD-10-CM | POA: Diagnosis not present

## 2023-03-09 DIAGNOSIS — G40909 Epilepsy, unspecified, not intractable, without status epilepticus: Secondary | ICD-10-CM | POA: Diagnosis not present

## 2023-03-14 DIAGNOSIS — F430 Acute stress reaction: Secondary | ICD-10-CM | POA: Diagnosis not present

## 2023-03-15 DIAGNOSIS — G40909 Epilepsy, unspecified, not intractable, without status epilepticus: Secondary | ICD-10-CM | POA: Diagnosis not present

## 2023-03-15 DIAGNOSIS — R4189 Other symptoms and signs involving cognitive functions and awareness: Secondary | ICD-10-CM | POA: Diagnosis not present

## 2023-03-15 DIAGNOSIS — K219 Gastro-esophageal reflux disease without esophagitis: Secondary | ICD-10-CM | POA: Diagnosis not present

## 2023-03-15 DIAGNOSIS — F419 Anxiety disorder, unspecified: Secondary | ICD-10-CM | POA: Diagnosis not present

## 2023-03-15 DIAGNOSIS — G894 Chronic pain syndrome: Secondary | ICD-10-CM | POA: Diagnosis not present

## 2023-03-15 DIAGNOSIS — M6281 Muscle weakness (generalized): Secondary | ICD-10-CM | POA: Diagnosis not present

## 2023-03-15 DIAGNOSIS — E46 Unspecified protein-calorie malnutrition: Secondary | ICD-10-CM | POA: Diagnosis not present

## 2023-03-15 DIAGNOSIS — F03918 Unspecified dementia, unspecified severity, with other behavioral disturbance: Secondary | ICD-10-CM | POA: Diagnosis not present

## 2023-03-21 DIAGNOSIS — F430 Acute stress reaction: Secondary | ICD-10-CM | POA: Diagnosis not present

## 2023-03-25 DIAGNOSIS — G894 Chronic pain syndrome: Secondary | ICD-10-CM | POA: Diagnosis not present

## 2023-03-25 DIAGNOSIS — F03918 Unspecified dementia, unspecified severity, with other behavioral disturbance: Secondary | ICD-10-CM | POA: Diagnosis not present

## 2023-03-28 DIAGNOSIS — F430 Acute stress reaction: Secondary | ICD-10-CM | POA: Diagnosis not present

## 2023-03-30 DIAGNOSIS — G40909 Epilepsy, unspecified, not intractable, without status epilepticus: Secondary | ICD-10-CM | POA: Diagnosis not present

## 2023-03-30 DIAGNOSIS — Z741 Need for assistance with personal care: Secondary | ICD-10-CM | POA: Diagnosis not present

## 2023-03-30 DIAGNOSIS — M6281 Muscle weakness (generalized): Secondary | ICD-10-CM | POA: Diagnosis not present

## 2023-03-31 DIAGNOSIS — G40909 Epilepsy, unspecified, not intractable, without status epilepticus: Secondary | ICD-10-CM | POA: Diagnosis not present

## 2023-03-31 DIAGNOSIS — Z741 Need for assistance with personal care: Secondary | ICD-10-CM | POA: Diagnosis not present

## 2023-03-31 DIAGNOSIS — M6281 Muscle weakness (generalized): Secondary | ICD-10-CM | POA: Diagnosis not present

## 2023-04-01 DIAGNOSIS — Z741 Need for assistance with personal care: Secondary | ICD-10-CM | POA: Diagnosis not present

## 2023-04-01 DIAGNOSIS — G40909 Epilepsy, unspecified, not intractable, without status epilepticus: Secondary | ICD-10-CM | POA: Diagnosis not present

## 2023-04-01 DIAGNOSIS — M6281 Muscle weakness (generalized): Secondary | ICD-10-CM | POA: Diagnosis not present

## 2023-04-04 DIAGNOSIS — G40909 Epilepsy, unspecified, not intractable, without status epilepticus: Secondary | ICD-10-CM | POA: Diagnosis not present

## 2023-04-04 DIAGNOSIS — F430 Acute stress reaction: Secondary | ICD-10-CM | POA: Diagnosis not present

## 2023-04-04 DIAGNOSIS — M6281 Muscle weakness (generalized): Secondary | ICD-10-CM | POA: Diagnosis not present

## 2023-04-04 DIAGNOSIS — Z741 Need for assistance with personal care: Secondary | ICD-10-CM | POA: Diagnosis not present

## 2023-04-05 DIAGNOSIS — M6281 Muscle weakness (generalized): Secondary | ICD-10-CM | POA: Diagnosis not present

## 2023-04-05 DIAGNOSIS — Z741 Need for assistance with personal care: Secondary | ICD-10-CM | POA: Diagnosis not present

## 2023-04-05 DIAGNOSIS — G40909 Epilepsy, unspecified, not intractable, without status epilepticus: Secondary | ICD-10-CM | POA: Diagnosis not present

## 2023-04-06 DIAGNOSIS — M6281 Muscle weakness (generalized): Secondary | ICD-10-CM | POA: Diagnosis not present

## 2023-04-06 DIAGNOSIS — G40909 Epilepsy, unspecified, not intractable, without status epilepticus: Secondary | ICD-10-CM | POA: Diagnosis not present

## 2023-04-06 DIAGNOSIS — Z741 Need for assistance with personal care: Secondary | ICD-10-CM | POA: Diagnosis not present

## 2023-04-11 DIAGNOSIS — F430 Acute stress reaction: Secondary | ICD-10-CM | POA: Diagnosis not present

## 2023-04-19 DIAGNOSIS — F430 Acute stress reaction: Secondary | ICD-10-CM | POA: Diagnosis not present

## 2023-04-26 DIAGNOSIS — F430 Acute stress reaction: Secondary | ICD-10-CM | POA: Diagnosis not present

## 2023-04-29 DIAGNOSIS — G40909 Epilepsy, unspecified, not intractable, without status epilepticus: Secondary | ICD-10-CM | POA: Diagnosis not present

## 2023-04-29 DIAGNOSIS — R4189 Other symptoms and signs involving cognitive functions and awareness: Secondary | ICD-10-CM | POA: Diagnosis not present

## 2023-04-29 DIAGNOSIS — G894 Chronic pain syndrome: Secondary | ICD-10-CM | POA: Diagnosis not present

## 2023-04-29 DIAGNOSIS — K219 Gastro-esophageal reflux disease without esophagitis: Secondary | ICD-10-CM | POA: Diagnosis not present

## 2023-05-04 DIAGNOSIS — F430 Acute stress reaction: Secondary | ICD-10-CM | POA: Diagnosis not present

## 2023-05-10 DIAGNOSIS — F430 Acute stress reaction: Secondary | ICD-10-CM | POA: Diagnosis not present

## 2023-05-19 DIAGNOSIS — F430 Acute stress reaction: Secondary | ICD-10-CM | POA: Diagnosis not present

## 2023-05-20 DIAGNOSIS — M6281 Muscle weakness (generalized): Secondary | ICD-10-CM | POA: Diagnosis not present

## 2023-05-20 DIAGNOSIS — E43 Unspecified severe protein-calorie malnutrition: Secondary | ICD-10-CM | POA: Diagnosis not present

## 2023-05-20 DIAGNOSIS — G40909 Epilepsy, unspecified, not intractable, without status epilepticus: Secondary | ICD-10-CM | POA: Diagnosis not present

## 2023-05-20 DIAGNOSIS — K219 Gastro-esophageal reflux disease without esophagitis: Secondary | ICD-10-CM | POA: Diagnosis not present

## 2023-05-23 DIAGNOSIS — F430 Acute stress reaction: Secondary | ICD-10-CM | POA: Diagnosis not present

## 2023-05-31 DIAGNOSIS — F419 Anxiety disorder, unspecified: Secondary | ICD-10-CM | POA: Diagnosis not present

## 2023-06-02 DIAGNOSIS — G3184 Mild cognitive impairment, so stated: Secondary | ICD-10-CM | POA: Diagnosis not present

## 2023-06-02 DIAGNOSIS — F0631 Mood disorder due to known physiological condition with depressive features: Secondary | ICD-10-CM | POA: Diagnosis not present

## 2023-06-02 DIAGNOSIS — F4323 Adjustment disorder with mixed anxiety and depressed mood: Secondary | ICD-10-CM | POA: Diagnosis not present

## 2023-06-06 DIAGNOSIS — F0394 Unspecified dementia, unspecified severity, with anxiety: Secondary | ICD-10-CM | POA: Diagnosis not present

## 2023-06-07 DIAGNOSIS — F419 Anxiety disorder, unspecified: Secondary | ICD-10-CM | POA: Diagnosis not present

## 2023-06-08 DIAGNOSIS — G40909 Epilepsy, unspecified, not intractable, without status epilepticus: Secondary | ICD-10-CM | POA: Diagnosis not present

## 2023-06-13 DIAGNOSIS — F0394 Unspecified dementia, unspecified severity, with anxiety: Secondary | ICD-10-CM | POA: Diagnosis not present

## 2023-06-13 DIAGNOSIS — F419 Anxiety disorder, unspecified: Secondary | ICD-10-CM | POA: Diagnosis not present

## 2023-06-20 DIAGNOSIS — B351 Tinea unguium: Secondary | ICD-10-CM | POA: Diagnosis not present

## 2023-06-20 DIAGNOSIS — R2681 Unsteadiness on feet: Secondary | ICD-10-CM | POA: Diagnosis not present

## 2023-06-20 DIAGNOSIS — M79674 Pain in right toe(s): Secondary | ICD-10-CM | POA: Diagnosis not present

## 2023-06-20 DIAGNOSIS — G40909 Epilepsy, unspecified, not intractable, without status epilepticus: Secondary | ICD-10-CM | POA: Diagnosis not present

## 2023-06-20 DIAGNOSIS — M79675 Pain in left toe(s): Secondary | ICD-10-CM | POA: Diagnosis not present

## 2023-06-20 DIAGNOSIS — L84 Corns and callosities: Secondary | ICD-10-CM | POA: Diagnosis not present

## 2023-06-20 DIAGNOSIS — L605 Yellow nail syndrome: Secondary | ICD-10-CM | POA: Diagnosis not present

## 2023-06-21 DIAGNOSIS — F419 Anxiety disorder, unspecified: Secondary | ICD-10-CM | POA: Diagnosis not present

## 2023-06-27 DIAGNOSIS — E46 Unspecified protein-calorie malnutrition: Secondary | ICD-10-CM | POA: Diagnosis not present

## 2023-06-27 DIAGNOSIS — F0394 Unspecified dementia, unspecified severity, with anxiety: Secondary | ICD-10-CM | POA: Diagnosis not present

## 2023-06-27 DIAGNOSIS — K219 Gastro-esophageal reflux disease without esophagitis: Secondary | ICD-10-CM | POA: Diagnosis not present

## 2023-06-27 DIAGNOSIS — G40909 Epilepsy, unspecified, not intractable, without status epilepticus: Secondary | ICD-10-CM | POA: Diagnosis not present

## 2023-06-27 DIAGNOSIS — G894 Chronic pain syndrome: Secondary | ICD-10-CM | POA: Diagnosis not present

## 2023-06-29 DIAGNOSIS — F419 Anxiety disorder, unspecified: Secondary | ICD-10-CM | POA: Diagnosis not present

## 2023-07-05 DIAGNOSIS — F419 Anxiety disorder, unspecified: Secondary | ICD-10-CM | POA: Diagnosis not present

## 2023-07-13 DIAGNOSIS — F419 Anxiety disorder, unspecified: Secondary | ICD-10-CM | POA: Diagnosis not present

## 2023-07-18 DIAGNOSIS — F419 Anxiety disorder, unspecified: Secondary | ICD-10-CM | POA: Diagnosis not present

## 2023-07-21 DIAGNOSIS — M25511 Pain in right shoulder: Secondary | ICD-10-CM | POA: Diagnosis not present

## 2023-07-21 DIAGNOSIS — F0394 Unspecified dementia, unspecified severity, with anxiety: Secondary | ICD-10-CM | POA: Diagnosis not present

## 2023-07-21 DIAGNOSIS — G894 Chronic pain syndrome: Secondary | ICD-10-CM | POA: Diagnosis not present

## 2023-07-22 DIAGNOSIS — M25511 Pain in right shoulder: Secondary | ICD-10-CM | POA: Diagnosis not present

## 2023-07-25 DIAGNOSIS — M25511 Pain in right shoulder: Secondary | ICD-10-CM | POA: Diagnosis not present

## 2023-07-27 DIAGNOSIS — F419 Anxiety disorder, unspecified: Secondary | ICD-10-CM | POA: Diagnosis not present

## 2023-08-01 DIAGNOSIS — F0631 Mood disorder due to known physiological condition with depressive features: Secondary | ICD-10-CM | POA: Diagnosis not present

## 2023-08-01 DIAGNOSIS — G4089 Other seizures: Secondary | ICD-10-CM | POA: Diagnosis not present

## 2023-08-01 DIAGNOSIS — G3184 Mild cognitive impairment, so stated: Secondary | ICD-10-CM | POA: Diagnosis not present

## 2023-08-01 DIAGNOSIS — F4323 Adjustment disorder with mixed anxiety and depressed mood: Secondary | ICD-10-CM | POA: Diagnosis not present

## 2023-08-02 DIAGNOSIS — F419 Anxiety disorder, unspecified: Secondary | ICD-10-CM | POA: Diagnosis not present

## 2023-08-12 DIAGNOSIS — F419 Anxiety disorder, unspecified: Secondary | ICD-10-CM | POA: Diagnosis not present

## 2023-08-19 DIAGNOSIS — F419 Anxiety disorder, unspecified: Secondary | ICD-10-CM | POA: Diagnosis not present

## 2023-08-24 DIAGNOSIS — F419 Anxiety disorder, unspecified: Secondary | ICD-10-CM | POA: Diagnosis not present

## 2023-08-30 DIAGNOSIS — K219 Gastro-esophageal reflux disease without esophagitis: Secondary | ICD-10-CM | POA: Diagnosis not present

## 2023-08-30 DIAGNOSIS — F0394 Unspecified dementia, unspecified severity, with anxiety: Secondary | ICD-10-CM | POA: Diagnosis not present

## 2023-08-30 DIAGNOSIS — G894 Chronic pain syndrome: Secondary | ICD-10-CM | POA: Diagnosis not present

## 2023-08-30 DIAGNOSIS — F419 Anxiety disorder, unspecified: Secondary | ICD-10-CM | POA: Diagnosis not present

## 2023-08-30 DIAGNOSIS — F03918 Unspecified dementia, unspecified severity, with other behavioral disturbance: Secondary | ICD-10-CM | POA: Diagnosis not present

## 2023-08-30 DIAGNOSIS — G40909 Epilepsy, unspecified, not intractable, without status epilepticus: Secondary | ICD-10-CM | POA: Diagnosis not present

## 2023-09-04 DIAGNOSIS — G40909 Epilepsy, unspecified, not intractable, without status epilepticus: Secondary | ICD-10-CM | POA: Diagnosis not present

## 2023-09-04 DIAGNOSIS — K219 Gastro-esophageal reflux disease without esophagitis: Secondary | ICD-10-CM | POA: Diagnosis not present

## 2023-09-04 DIAGNOSIS — E43 Unspecified severe protein-calorie malnutrition: Secondary | ICD-10-CM | POA: Diagnosis not present

## 2023-09-04 DIAGNOSIS — M6281 Muscle weakness (generalized): Secondary | ICD-10-CM | POA: Diagnosis not present

## 2023-09-08 DIAGNOSIS — G40909 Epilepsy, unspecified, not intractable, without status epilepticus: Secondary | ICD-10-CM | POA: Diagnosis not present

## 2023-09-08 DIAGNOSIS — F411 Generalized anxiety disorder: Secondary | ICD-10-CM | POA: Diagnosis not present

## 2023-09-08 DIAGNOSIS — G4089 Other seizures: Secondary | ICD-10-CM | POA: Diagnosis not present

## 2023-09-09 DIAGNOSIS — G40909 Epilepsy, unspecified, not intractable, without status epilepticus: Secondary | ICD-10-CM | POA: Diagnosis not present

## 2023-09-13 DIAGNOSIS — M79674 Pain in right toe(s): Secondary | ICD-10-CM | POA: Diagnosis not present

## 2023-09-13 DIAGNOSIS — M19071 Primary osteoarthritis, right ankle and foot: Secondary | ICD-10-CM | POA: Diagnosis not present

## 2023-09-13 DIAGNOSIS — L608 Other nail disorders: Secondary | ICD-10-CM | POA: Diagnosis not present

## 2023-09-13 DIAGNOSIS — R2689 Other abnormalities of gait and mobility: Secondary | ICD-10-CM | POA: Diagnosis not present

## 2023-09-13 DIAGNOSIS — M79675 Pain in left toe(s): Secondary | ICD-10-CM | POA: Diagnosis not present

## 2023-09-13 DIAGNOSIS — L851 Acquired keratosis [keratoderma] palmaris et plantaris: Secondary | ICD-10-CM | POA: Diagnosis not present

## 2023-09-13 DIAGNOSIS — B351 Tinea unguium: Secondary | ICD-10-CM | POA: Diagnosis not present

## 2023-09-19 DIAGNOSIS — H43813 Vitreous degeneration, bilateral: Secondary | ICD-10-CM | POA: Diagnosis not present

## 2023-09-19 DIAGNOSIS — H04123 Dry eye syndrome of bilateral lacrimal glands: Secondary | ICD-10-CM | POA: Diagnosis not present

## 2023-09-19 DIAGNOSIS — H18513 Endothelial corneal dystrophy, bilateral: Secondary | ICD-10-CM | POA: Diagnosis not present

## 2023-09-19 DIAGNOSIS — H2513 Age-related nuclear cataract, bilateral: Secondary | ICD-10-CM | POA: Diagnosis not present

## 2023-09-22 DIAGNOSIS — R6889 Other general symptoms and signs: Secondary | ICD-10-CM | POA: Diagnosis not present

## 2023-09-22 DIAGNOSIS — Z87891 Personal history of nicotine dependence: Secondary | ICD-10-CM | POA: Diagnosis not present

## 2023-09-22 DIAGNOSIS — K219 Gastro-esophageal reflux disease without esophagitis: Secondary | ICD-10-CM | POA: Diagnosis not present

## 2023-09-22 DIAGNOSIS — R4182 Altered mental status, unspecified: Secondary | ICD-10-CM | POA: Diagnosis not present

## 2023-09-22 DIAGNOSIS — F0390 Unspecified dementia without behavioral disturbance: Secondary | ICD-10-CM | POA: Diagnosis not present

## 2023-09-22 DIAGNOSIS — G8929 Other chronic pain: Secondary | ICD-10-CM | POA: Diagnosis not present

## 2023-09-22 DIAGNOSIS — R410 Disorientation, unspecified: Secondary | ICD-10-CM | POA: Diagnosis not present

## 2023-09-22 DIAGNOSIS — R0902 Hypoxemia: Secondary | ICD-10-CM | POA: Diagnosis not present

## 2023-09-24 DIAGNOSIS — R4182 Altered mental status, unspecified: Secondary | ICD-10-CM | POA: Diagnosis not present

## 2023-09-24 DIAGNOSIS — W19XXXA Unspecified fall, initial encounter: Secondary | ICD-10-CM | POA: Diagnosis not present

## 2023-10-02 DIAGNOSIS — G40909 Epilepsy, unspecified, not intractable, without status epilepticus: Secondary | ICD-10-CM | POA: Diagnosis not present

## 2023-10-02 DIAGNOSIS — R262 Difficulty in walking, not elsewhere classified: Secondary | ICD-10-CM | POA: Diagnosis not present

## 2023-10-02 DIAGNOSIS — M6281 Muscle weakness (generalized): Secondary | ICD-10-CM | POA: Diagnosis not present

## 2023-10-03 DIAGNOSIS — R195 Other fecal abnormalities: Secondary | ICD-10-CM | POA: Diagnosis not present

## 2023-10-03 DIAGNOSIS — Z8719 Personal history of other diseases of the digestive system: Secondary | ICD-10-CM | POA: Diagnosis not present

## 2023-10-04 DIAGNOSIS — G40909 Epilepsy, unspecified, not intractable, without status epilepticus: Secondary | ICD-10-CM | POA: Diagnosis not present

## 2023-10-04 DIAGNOSIS — M6281 Muscle weakness (generalized): Secondary | ICD-10-CM | POA: Diagnosis not present

## 2023-10-04 DIAGNOSIS — R262 Difficulty in walking, not elsewhere classified: Secondary | ICD-10-CM | POA: Diagnosis not present

## 2023-10-05 DIAGNOSIS — R262 Difficulty in walking, not elsewhere classified: Secondary | ICD-10-CM | POA: Diagnosis not present

## 2023-10-05 DIAGNOSIS — G40909 Epilepsy, unspecified, not intractable, without status epilepticus: Secondary | ICD-10-CM | POA: Diagnosis not present

## 2023-10-05 DIAGNOSIS — M6281 Muscle weakness (generalized): Secondary | ICD-10-CM | POA: Diagnosis not present

## 2023-10-12 DIAGNOSIS — M6281 Muscle weakness (generalized): Secondary | ICD-10-CM | POA: Diagnosis not present

## 2023-10-12 DIAGNOSIS — R262 Difficulty in walking, not elsewhere classified: Secondary | ICD-10-CM | POA: Diagnosis not present

## 2023-10-12 DIAGNOSIS — G40909 Epilepsy, unspecified, not intractable, without status epilepticus: Secondary | ICD-10-CM | POA: Diagnosis not present

## 2023-10-13 DIAGNOSIS — G40909 Epilepsy, unspecified, not intractable, without status epilepticus: Secondary | ICD-10-CM | POA: Diagnosis not present

## 2023-10-13 DIAGNOSIS — R262 Difficulty in walking, not elsewhere classified: Secondary | ICD-10-CM | POA: Diagnosis not present

## 2023-10-13 DIAGNOSIS — M6281 Muscle weakness (generalized): Secondary | ICD-10-CM | POA: Diagnosis not present

## 2023-10-14 DIAGNOSIS — R262 Difficulty in walking, not elsewhere classified: Secondary | ICD-10-CM | POA: Diagnosis not present

## 2023-10-14 DIAGNOSIS — M6281 Muscle weakness (generalized): Secondary | ICD-10-CM | POA: Diagnosis not present

## 2023-10-14 DIAGNOSIS — G40909 Epilepsy, unspecified, not intractable, without status epilepticus: Secondary | ICD-10-CM | POA: Diagnosis not present

## 2023-10-17 DIAGNOSIS — R262 Difficulty in walking, not elsewhere classified: Secondary | ICD-10-CM | POA: Diagnosis not present

## 2023-10-17 DIAGNOSIS — M6281 Muscle weakness (generalized): Secondary | ICD-10-CM | POA: Diagnosis not present

## 2023-10-17 DIAGNOSIS — G40909 Epilepsy, unspecified, not intractable, without status epilepticus: Secondary | ICD-10-CM | POA: Diagnosis not present

## 2023-10-18 DIAGNOSIS — M6281 Muscle weakness (generalized): Secondary | ICD-10-CM | POA: Diagnosis not present

## 2023-10-18 DIAGNOSIS — G40909 Epilepsy, unspecified, not intractable, without status epilepticus: Secondary | ICD-10-CM | POA: Diagnosis not present

## 2023-10-18 DIAGNOSIS — R262 Difficulty in walking, not elsewhere classified: Secondary | ICD-10-CM | POA: Diagnosis not present

## 2023-10-20 DIAGNOSIS — M6281 Muscle weakness (generalized): Secondary | ICD-10-CM | POA: Diagnosis not present

## 2023-10-20 DIAGNOSIS — R262 Difficulty in walking, not elsewhere classified: Secondary | ICD-10-CM | POA: Diagnosis not present

## 2023-10-20 DIAGNOSIS — G40909 Epilepsy, unspecified, not intractable, without status epilepticus: Secondary | ICD-10-CM | POA: Diagnosis not present

## 2023-10-24 DIAGNOSIS — M6281 Muscle weakness (generalized): Secondary | ICD-10-CM | POA: Diagnosis not present

## 2023-10-24 DIAGNOSIS — G40909 Epilepsy, unspecified, not intractable, without status epilepticus: Secondary | ICD-10-CM | POA: Diagnosis not present

## 2023-10-24 DIAGNOSIS — R262 Difficulty in walking, not elsewhere classified: Secondary | ICD-10-CM | POA: Diagnosis not present

## 2023-10-25 DIAGNOSIS — R262 Difficulty in walking, not elsewhere classified: Secondary | ICD-10-CM | POA: Diagnosis not present

## 2023-10-25 DIAGNOSIS — M6281 Muscle weakness (generalized): Secondary | ICD-10-CM | POA: Diagnosis not present

## 2023-10-25 DIAGNOSIS — G40909 Epilepsy, unspecified, not intractable, without status epilepticus: Secondary | ICD-10-CM | POA: Diagnosis not present

## 2023-10-26 DIAGNOSIS — R262 Difficulty in walking, not elsewhere classified: Secondary | ICD-10-CM | POA: Diagnosis not present

## 2023-10-26 DIAGNOSIS — M6281 Muscle weakness (generalized): Secondary | ICD-10-CM | POA: Diagnosis not present

## 2023-10-26 DIAGNOSIS — G40909 Epilepsy, unspecified, not intractable, without status epilepticus: Secondary | ICD-10-CM | POA: Diagnosis not present

## 2023-10-31 DIAGNOSIS — M6281 Muscle weakness (generalized): Secondary | ICD-10-CM | POA: Diagnosis not present

## 2023-10-31 DIAGNOSIS — R262 Difficulty in walking, not elsewhere classified: Secondary | ICD-10-CM | POA: Diagnosis not present

## 2023-10-31 DIAGNOSIS — G40909 Epilepsy, unspecified, not intractable, without status epilepticus: Secondary | ICD-10-CM | POA: Diagnosis not present

## 2023-11-01 DIAGNOSIS — G40909 Epilepsy, unspecified, not intractable, without status epilepticus: Secondary | ICD-10-CM | POA: Diagnosis not present

## 2023-11-01 DIAGNOSIS — M6281 Muscle weakness (generalized): Secondary | ICD-10-CM | POA: Diagnosis not present

## 2023-11-01 DIAGNOSIS — R262 Difficulty in walking, not elsewhere classified: Secondary | ICD-10-CM | POA: Diagnosis not present

## 2023-11-02 DIAGNOSIS — G40909 Epilepsy, unspecified, not intractable, without status epilepticus: Secondary | ICD-10-CM | POA: Diagnosis not present

## 2023-11-02 DIAGNOSIS — R262 Difficulty in walking, not elsewhere classified: Secondary | ICD-10-CM | POA: Diagnosis not present

## 2023-11-02 DIAGNOSIS — M6281 Muscle weakness (generalized): Secondary | ICD-10-CM | POA: Diagnosis not present

## 2023-11-03 DIAGNOSIS — F411 Generalized anxiety disorder: Secondary | ICD-10-CM | POA: Diagnosis not present

## 2023-11-03 DIAGNOSIS — G4089 Other seizures: Secondary | ICD-10-CM | POA: Diagnosis not present

## 2023-11-06 DIAGNOSIS — R262 Difficulty in walking, not elsewhere classified: Secondary | ICD-10-CM | POA: Diagnosis not present

## 2023-11-06 DIAGNOSIS — M6281 Muscle weakness (generalized): Secondary | ICD-10-CM | POA: Diagnosis not present

## 2023-11-06 DIAGNOSIS — G40909 Epilepsy, unspecified, not intractable, without status epilepticus: Secondary | ICD-10-CM | POA: Diagnosis not present

## 2023-11-07 DIAGNOSIS — G40909 Epilepsy, unspecified, not intractable, without status epilepticus: Secondary | ICD-10-CM | POA: Diagnosis not present

## 2023-11-07 DIAGNOSIS — M6281 Muscle weakness (generalized): Secondary | ICD-10-CM | POA: Diagnosis not present

## 2023-11-07 DIAGNOSIS — R262 Difficulty in walking, not elsewhere classified: Secondary | ICD-10-CM | POA: Diagnosis not present

## 2023-11-09 DIAGNOSIS — R262 Difficulty in walking, not elsewhere classified: Secondary | ICD-10-CM | POA: Diagnosis not present

## 2023-11-09 DIAGNOSIS — G40909 Epilepsy, unspecified, not intractable, without status epilepticus: Secondary | ICD-10-CM | POA: Diagnosis not present

## 2023-11-09 DIAGNOSIS — M6281 Muscle weakness (generalized): Secondary | ICD-10-CM | POA: Diagnosis not present

## 2023-11-13 DIAGNOSIS — R262 Difficulty in walking, not elsewhere classified: Secondary | ICD-10-CM | POA: Diagnosis not present

## 2023-11-13 DIAGNOSIS — M6281 Muscle weakness (generalized): Secondary | ICD-10-CM | POA: Diagnosis not present

## 2023-11-13 DIAGNOSIS — G40909 Epilepsy, unspecified, not intractable, without status epilepticus: Secondary | ICD-10-CM | POA: Diagnosis not present

## 2023-11-14 DIAGNOSIS — R262 Difficulty in walking, not elsewhere classified: Secondary | ICD-10-CM | POA: Diagnosis not present

## 2023-11-14 DIAGNOSIS — M6281 Muscle weakness (generalized): Secondary | ICD-10-CM | POA: Diagnosis not present

## 2023-11-14 DIAGNOSIS — G40909 Epilepsy, unspecified, not intractable, without status epilepticus: Secondary | ICD-10-CM | POA: Diagnosis not present

## 2023-11-17 DIAGNOSIS — R262 Difficulty in walking, not elsewhere classified: Secondary | ICD-10-CM | POA: Diagnosis not present

## 2023-11-17 DIAGNOSIS — M6281 Muscle weakness (generalized): Secondary | ICD-10-CM | POA: Diagnosis not present

## 2023-11-17 DIAGNOSIS — G40909 Epilepsy, unspecified, not intractable, without status epilepticus: Secondary | ICD-10-CM | POA: Diagnosis not present

## 2023-11-21 DIAGNOSIS — F0394 Unspecified dementia, unspecified severity, with anxiety: Secondary | ICD-10-CM | POA: Diagnosis not present

## 2023-11-21 DIAGNOSIS — G894 Chronic pain syndrome: Secondary | ICD-10-CM | POA: Diagnosis not present

## 2023-11-21 DIAGNOSIS — G40909 Epilepsy, unspecified, not intractable, without status epilepticus: Secondary | ICD-10-CM | POA: Diagnosis not present

## 2023-11-22 DIAGNOSIS — R262 Difficulty in walking, not elsewhere classified: Secondary | ICD-10-CM | POA: Diagnosis not present

## 2023-11-22 DIAGNOSIS — G40909 Epilepsy, unspecified, not intractable, without status epilepticus: Secondary | ICD-10-CM | POA: Diagnosis not present

## 2023-11-22 DIAGNOSIS — M6281 Muscle weakness (generalized): Secondary | ICD-10-CM | POA: Diagnosis not present

## 2023-11-22 DIAGNOSIS — H2512 Age-related nuclear cataract, left eye: Secondary | ICD-10-CM | POA: Diagnosis not present

## 2023-11-23 DIAGNOSIS — M6281 Muscle weakness (generalized): Secondary | ICD-10-CM | POA: Diagnosis not present

## 2023-11-23 DIAGNOSIS — G40909 Epilepsy, unspecified, not intractable, without status epilepticus: Secondary | ICD-10-CM | POA: Diagnosis not present

## 2023-11-23 DIAGNOSIS — R262 Difficulty in walking, not elsewhere classified: Secondary | ICD-10-CM | POA: Diagnosis not present

## 2023-11-24 DIAGNOSIS — R262 Difficulty in walking, not elsewhere classified: Secondary | ICD-10-CM | POA: Diagnosis not present

## 2023-11-24 DIAGNOSIS — M6281 Muscle weakness (generalized): Secondary | ICD-10-CM | POA: Diagnosis not present

## 2023-11-24 DIAGNOSIS — G40909 Epilepsy, unspecified, not intractable, without status epilepticus: Secondary | ICD-10-CM | POA: Diagnosis not present

## 2023-11-28 DIAGNOSIS — R262 Difficulty in walking, not elsewhere classified: Secondary | ICD-10-CM | POA: Diagnosis not present

## 2023-11-28 DIAGNOSIS — G40909 Epilepsy, unspecified, not intractable, without status epilepticus: Secondary | ICD-10-CM | POA: Diagnosis not present

## 2023-11-28 DIAGNOSIS — M6281 Muscle weakness (generalized): Secondary | ICD-10-CM | POA: Diagnosis not present

## 2023-11-29 DIAGNOSIS — G40909 Epilepsy, unspecified, not intractable, without status epilepticus: Secondary | ICD-10-CM | POA: Diagnosis not present

## 2023-11-29 DIAGNOSIS — M6281 Muscle weakness (generalized): Secondary | ICD-10-CM | POA: Diagnosis not present

## 2023-11-29 DIAGNOSIS — R262 Difficulty in walking, not elsewhere classified: Secondary | ICD-10-CM | POA: Diagnosis not present

## 2023-12-04 DIAGNOSIS — G40909 Epilepsy, unspecified, not intractable, without status epilepticus: Secondary | ICD-10-CM | POA: Diagnosis not present

## 2023-12-04 DIAGNOSIS — R262 Difficulty in walking, not elsewhere classified: Secondary | ICD-10-CM | POA: Diagnosis not present

## 2023-12-04 DIAGNOSIS — M6281 Muscle weakness (generalized): Secondary | ICD-10-CM | POA: Diagnosis not present

## 2023-12-05 DIAGNOSIS — M6281 Muscle weakness (generalized): Secondary | ICD-10-CM | POA: Diagnosis not present

## 2023-12-05 DIAGNOSIS — G40909 Epilepsy, unspecified, not intractable, without status epilepticus: Secondary | ICD-10-CM | POA: Diagnosis not present

## 2023-12-05 DIAGNOSIS — R262 Difficulty in walking, not elsewhere classified: Secondary | ICD-10-CM | POA: Diagnosis not present

## 2023-12-06 DIAGNOSIS — R262 Difficulty in walking, not elsewhere classified: Secondary | ICD-10-CM | POA: Diagnosis not present

## 2023-12-06 DIAGNOSIS — G40909 Epilepsy, unspecified, not intractable, without status epilepticus: Secondary | ICD-10-CM | POA: Diagnosis not present

## 2023-12-06 DIAGNOSIS — M6281 Muscle weakness (generalized): Secondary | ICD-10-CM | POA: Diagnosis not present

## 2023-12-08 DIAGNOSIS — H2511 Age-related nuclear cataract, right eye: Secondary | ICD-10-CM | POA: Diagnosis not present

## 2023-12-08 DIAGNOSIS — G40909 Epilepsy, unspecified, not intractable, without status epilepticus: Secondary | ICD-10-CM | POA: Diagnosis not present

## 2023-12-13 DIAGNOSIS — M545 Low back pain, unspecified: Secondary | ICD-10-CM | POA: Diagnosis not present

## 2023-12-15 DIAGNOSIS — R569 Unspecified convulsions: Secondary | ICD-10-CM | POA: Diagnosis not present

## 2023-12-15 DIAGNOSIS — Z8669 Personal history of other diseases of the nervous system and sense organs: Secondary | ICD-10-CM | POA: Diagnosis not present

## 2023-12-15 DIAGNOSIS — R262 Difficulty in walking, not elsewhere classified: Secondary | ICD-10-CM | POA: Diagnosis not present

## 2023-12-15 DIAGNOSIS — G40909 Epilepsy, unspecified, not intractable, without status epilepticus: Secondary | ICD-10-CM | POA: Diagnosis not present

## 2023-12-15 DIAGNOSIS — M6281 Muscle weakness (generalized): Secondary | ICD-10-CM | POA: Diagnosis not present

## 2023-12-16 DIAGNOSIS — R262 Difficulty in walking, not elsewhere classified: Secondary | ICD-10-CM | POA: Diagnosis not present

## 2023-12-16 DIAGNOSIS — M6281 Muscle weakness (generalized): Secondary | ICD-10-CM | POA: Diagnosis not present

## 2023-12-16 DIAGNOSIS — G40909 Epilepsy, unspecified, not intractable, without status epilepticus: Secondary | ICD-10-CM | POA: Diagnosis not present

## 2023-12-17 DIAGNOSIS — R262 Difficulty in walking, not elsewhere classified: Secondary | ICD-10-CM | POA: Diagnosis not present

## 2023-12-17 DIAGNOSIS — G40909 Epilepsy, unspecified, not intractable, without status epilepticus: Secondary | ICD-10-CM | POA: Diagnosis not present

## 2023-12-17 DIAGNOSIS — M6281 Muscle weakness (generalized): Secondary | ICD-10-CM | POA: Diagnosis not present

## 2023-12-19 DIAGNOSIS — G894 Chronic pain syndrome: Secondary | ICD-10-CM | POA: Diagnosis not present

## 2023-12-19 DIAGNOSIS — F0394 Unspecified dementia, unspecified severity, with anxiety: Secondary | ICD-10-CM | POA: Diagnosis not present

## 2023-12-19 DIAGNOSIS — G40909 Epilepsy, unspecified, not intractable, without status epilepticus: Secondary | ICD-10-CM | POA: Diagnosis not present

## 2023-12-20 DIAGNOSIS — E43 Unspecified severe protein-calorie malnutrition: Secondary | ICD-10-CM | POA: Diagnosis not present

## 2023-12-22 DIAGNOSIS — G40909 Epilepsy, unspecified, not intractable, without status epilepticus: Secondary | ICD-10-CM | POA: Diagnosis not present

## 2023-12-22 DIAGNOSIS — R262 Difficulty in walking, not elsewhere classified: Secondary | ICD-10-CM | POA: Diagnosis not present

## 2023-12-22 DIAGNOSIS — M6281 Muscle weakness (generalized): Secondary | ICD-10-CM | POA: Diagnosis not present

## 2023-12-23 DIAGNOSIS — R262 Difficulty in walking, not elsewhere classified: Secondary | ICD-10-CM | POA: Diagnosis not present

## 2023-12-23 DIAGNOSIS — G40909 Epilepsy, unspecified, not intractable, without status epilepticus: Secondary | ICD-10-CM | POA: Diagnosis not present

## 2023-12-23 DIAGNOSIS — M6281 Muscle weakness (generalized): Secondary | ICD-10-CM | POA: Diagnosis not present

## 2023-12-25 DIAGNOSIS — M6281 Muscle weakness (generalized): Secondary | ICD-10-CM | POA: Diagnosis not present

## 2023-12-25 DIAGNOSIS — G40909 Epilepsy, unspecified, not intractable, without status epilepticus: Secondary | ICD-10-CM | POA: Diagnosis not present

## 2023-12-25 DIAGNOSIS — R262 Difficulty in walking, not elsewhere classified: Secondary | ICD-10-CM | POA: Diagnosis not present

## 2023-12-26 DIAGNOSIS — Z79899 Other long term (current) drug therapy: Secondary | ICD-10-CM | POA: Diagnosis not present

## 2023-12-26 DIAGNOSIS — G40909 Epilepsy, unspecified, not intractable, without status epilepticus: Secondary | ICD-10-CM | POA: Diagnosis not present

## 2023-12-26 DIAGNOSIS — R262 Difficulty in walking, not elsewhere classified: Secondary | ICD-10-CM | POA: Diagnosis not present

## 2023-12-26 DIAGNOSIS — M6281 Muscle weakness (generalized): Secondary | ICD-10-CM | POA: Diagnosis not present

## 2023-12-27 DIAGNOSIS — R262 Difficulty in walking, not elsewhere classified: Secondary | ICD-10-CM | POA: Diagnosis not present

## 2023-12-27 DIAGNOSIS — M6281 Muscle weakness (generalized): Secondary | ICD-10-CM | POA: Diagnosis not present

## 2023-12-27 DIAGNOSIS — G40909 Epilepsy, unspecified, not intractable, without status epilepticus: Secondary | ICD-10-CM | POA: Diagnosis not present

## 2023-12-29 DIAGNOSIS — R2689 Other abnormalities of gait and mobility: Secondary | ICD-10-CM | POA: Diagnosis not present

## 2023-12-29 DIAGNOSIS — B351 Tinea unguium: Secondary | ICD-10-CM | POA: Diagnosis not present

## 2023-12-29 DIAGNOSIS — G40909 Epilepsy, unspecified, not intractable, without status epilepticus: Secondary | ICD-10-CM | POA: Diagnosis not present

## 2023-12-29 DIAGNOSIS — L84 Corns and callosities: Secondary | ICD-10-CM | POA: Diagnosis not present

## 2023-12-29 DIAGNOSIS — L603 Nail dystrophy: Secondary | ICD-10-CM | POA: Diagnosis not present

## 2023-12-29 DIAGNOSIS — M79674 Pain in right toe(s): Secondary | ICD-10-CM | POA: Diagnosis not present

## 2023-12-29 DIAGNOSIS — M79675 Pain in left toe(s): Secondary | ICD-10-CM | POA: Diagnosis not present

## 2024-01-01 DIAGNOSIS — M6281 Muscle weakness (generalized): Secondary | ICD-10-CM | POA: Diagnosis not present

## 2024-01-01 DIAGNOSIS — G40909 Epilepsy, unspecified, not intractable, without status epilepticus: Secondary | ICD-10-CM | POA: Diagnosis not present

## 2024-01-01 DIAGNOSIS — R262 Difficulty in walking, not elsewhere classified: Secondary | ICD-10-CM | POA: Diagnosis not present

## 2024-01-02 DIAGNOSIS — M6281 Muscle weakness (generalized): Secondary | ICD-10-CM | POA: Diagnosis not present

## 2024-01-02 DIAGNOSIS — G40909 Epilepsy, unspecified, not intractable, without status epilepticus: Secondary | ICD-10-CM | POA: Diagnosis not present

## 2024-01-02 DIAGNOSIS — F0394 Unspecified dementia, unspecified severity, with anxiety: Secondary | ICD-10-CM | POA: Diagnosis not present

## 2024-01-02 DIAGNOSIS — G894 Chronic pain syndrome: Secondary | ICD-10-CM | POA: Diagnosis not present

## 2024-01-02 DIAGNOSIS — R262 Difficulty in walking, not elsewhere classified: Secondary | ICD-10-CM | POA: Diagnosis not present

## 2024-01-05 DIAGNOSIS — R262 Difficulty in walking, not elsewhere classified: Secondary | ICD-10-CM | POA: Diagnosis not present

## 2024-01-05 DIAGNOSIS — G40909 Epilepsy, unspecified, not intractable, without status epilepticus: Secondary | ICD-10-CM | POA: Diagnosis not present

## 2024-01-05 DIAGNOSIS — M6281 Muscle weakness (generalized): Secondary | ICD-10-CM | POA: Diagnosis not present

## 2024-01-07 DIAGNOSIS — G40909 Epilepsy, unspecified, not intractable, without status epilepticus: Secondary | ICD-10-CM | POA: Diagnosis not present

## 2024-01-07 DIAGNOSIS — R262 Difficulty in walking, not elsewhere classified: Secondary | ICD-10-CM | POA: Diagnosis not present

## 2024-01-07 DIAGNOSIS — M6281 Muscle weakness (generalized): Secondary | ICD-10-CM | POA: Diagnosis not present

## 2024-01-09 DIAGNOSIS — R262 Difficulty in walking, not elsewhere classified: Secondary | ICD-10-CM | POA: Diagnosis not present

## 2024-01-09 DIAGNOSIS — M6281 Muscle weakness (generalized): Secondary | ICD-10-CM | POA: Diagnosis not present

## 2024-01-09 DIAGNOSIS — G40909 Epilepsy, unspecified, not intractable, without status epilepticus: Secondary | ICD-10-CM | POA: Diagnosis not present

## 2024-01-10 DIAGNOSIS — G40909 Epilepsy, unspecified, not intractable, without status epilepticus: Secondary | ICD-10-CM | POA: Diagnosis not present

## 2024-01-10 DIAGNOSIS — R262 Difficulty in walking, not elsewhere classified: Secondary | ICD-10-CM | POA: Diagnosis not present

## 2024-01-10 DIAGNOSIS — M6281 Muscle weakness (generalized): Secondary | ICD-10-CM | POA: Diagnosis not present

## 2024-01-11 DIAGNOSIS — G40909 Epilepsy, unspecified, not intractable, without status epilepticus: Secondary | ICD-10-CM | POA: Diagnosis not present

## 2024-01-11 DIAGNOSIS — M6281 Muscle weakness (generalized): Secondary | ICD-10-CM | POA: Diagnosis not present

## 2024-01-11 DIAGNOSIS — R262 Difficulty in walking, not elsewhere classified: Secondary | ICD-10-CM | POA: Diagnosis not present

## 2024-01-16 DIAGNOSIS — G40909 Epilepsy, unspecified, not intractable, without status epilepticus: Secondary | ICD-10-CM | POA: Diagnosis not present

## 2024-01-16 DIAGNOSIS — R1312 Dysphagia, oropharyngeal phase: Secondary | ICD-10-CM | POA: Diagnosis not present

## 2024-01-18 DIAGNOSIS — G40909 Epilepsy, unspecified, not intractable, without status epilepticus: Secondary | ICD-10-CM | POA: Diagnosis not present

## 2024-01-18 DIAGNOSIS — R1312 Dysphagia, oropharyngeal phase: Secondary | ICD-10-CM | POA: Diagnosis not present

## 2024-01-19 DIAGNOSIS — R1312 Dysphagia, oropharyngeal phase: Secondary | ICD-10-CM | POA: Diagnosis not present

## 2024-01-19 DIAGNOSIS — G40909 Epilepsy, unspecified, not intractable, without status epilepticus: Secondary | ICD-10-CM | POA: Diagnosis not present

## 2024-01-23 DIAGNOSIS — G40909 Epilepsy, unspecified, not intractable, without status epilepticus: Secondary | ICD-10-CM | POA: Diagnosis not present

## 2024-01-23 DIAGNOSIS — R1312 Dysphagia, oropharyngeal phase: Secondary | ICD-10-CM | POA: Diagnosis not present

## 2024-01-25 DIAGNOSIS — R1312 Dysphagia, oropharyngeal phase: Secondary | ICD-10-CM | POA: Diagnosis not present

## 2024-01-25 DIAGNOSIS — G40909 Epilepsy, unspecified, not intractable, without status epilepticus: Secondary | ICD-10-CM | POA: Diagnosis not present

## 2024-01-27 DIAGNOSIS — G4089 Other seizures: Secondary | ICD-10-CM | POA: Diagnosis not present

## 2024-01-27 DIAGNOSIS — F411 Generalized anxiety disorder: Secondary | ICD-10-CM | POA: Diagnosis not present

## 2024-04-02 DIAGNOSIS — M6281 Muscle weakness (generalized): Secondary | ICD-10-CM | POA: Diagnosis not present

## 2024-04-02 DIAGNOSIS — G40909 Epilepsy, unspecified, not intractable, without status epilepticus: Secondary | ICD-10-CM | POA: Diagnosis not present

## 2024-04-02 DIAGNOSIS — R262 Difficulty in walking, not elsewhere classified: Secondary | ICD-10-CM | POA: Diagnosis not present

## 2024-04-07 DIAGNOSIS — R262 Difficulty in walking, not elsewhere classified: Secondary | ICD-10-CM | POA: Diagnosis not present

## 2024-04-07 DIAGNOSIS — M6281 Muscle weakness (generalized): Secondary | ICD-10-CM | POA: Diagnosis not present

## 2024-04-07 DIAGNOSIS — G40909 Epilepsy, unspecified, not intractable, without status epilepticus: Secondary | ICD-10-CM | POA: Diagnosis not present

## 2024-04-09 DIAGNOSIS — R262 Difficulty in walking, not elsewhere classified: Secondary | ICD-10-CM | POA: Diagnosis not present

## 2024-04-09 DIAGNOSIS — M6281 Muscle weakness (generalized): Secondary | ICD-10-CM | POA: Diagnosis not present

## 2024-04-09 DIAGNOSIS — G40909 Epilepsy, unspecified, not intractable, without status epilepticus: Secondary | ICD-10-CM | POA: Diagnosis not present

## 2024-04-10 DIAGNOSIS — M6281 Muscle weakness (generalized): Secondary | ICD-10-CM | POA: Diagnosis not present

## 2024-04-10 DIAGNOSIS — R1310 Dysphagia, unspecified: Secondary | ICD-10-CM | POA: Diagnosis not present

## 2024-04-10 DIAGNOSIS — G40909 Epilepsy, unspecified, not intractable, without status epilepticus: Secondary | ICD-10-CM | POA: Diagnosis not present

## 2024-04-10 DIAGNOSIS — R262 Difficulty in walking, not elsewhere classified: Secondary | ICD-10-CM | POA: Diagnosis not present

## 2024-04-11 DIAGNOSIS — R262 Difficulty in walking, not elsewhere classified: Secondary | ICD-10-CM | POA: Diagnosis not present

## 2024-04-11 DIAGNOSIS — G40909 Epilepsy, unspecified, not intractable, without status epilepticus: Secondary | ICD-10-CM | POA: Diagnosis not present

## 2024-04-11 DIAGNOSIS — M6281 Muscle weakness (generalized): Secondary | ICD-10-CM | POA: Diagnosis not present

## 2024-04-12 DIAGNOSIS — R262 Difficulty in walking, not elsewhere classified: Secondary | ICD-10-CM | POA: Diagnosis not present

## 2024-04-12 DIAGNOSIS — M6281 Muscle weakness (generalized): Secondary | ICD-10-CM | POA: Diagnosis not present

## 2024-04-12 DIAGNOSIS — G40909 Epilepsy, unspecified, not intractable, without status epilepticus: Secondary | ICD-10-CM | POA: Diagnosis not present

## 2024-04-16 DIAGNOSIS — R262 Difficulty in walking, not elsewhere classified: Secondary | ICD-10-CM | POA: Diagnosis not present

## 2024-04-16 DIAGNOSIS — M6281 Muscle weakness (generalized): Secondary | ICD-10-CM | POA: Diagnosis not present

## 2024-04-16 DIAGNOSIS — G40909 Epilepsy, unspecified, not intractable, without status epilepticus: Secondary | ICD-10-CM | POA: Diagnosis not present

## 2024-04-18 DIAGNOSIS — R262 Difficulty in walking, not elsewhere classified: Secondary | ICD-10-CM | POA: Diagnosis not present

## 2024-04-18 DIAGNOSIS — M6281 Muscle weakness (generalized): Secondary | ICD-10-CM | POA: Diagnosis not present

## 2024-04-18 DIAGNOSIS — G40909 Epilepsy, unspecified, not intractable, without status epilepticus: Secondary | ICD-10-CM | POA: Diagnosis not present

## 2024-07-03 DIAGNOSIS — M6281 Muscle weakness (generalized): Secondary | ICD-10-CM | POA: Diagnosis not present

## 2024-07-03 DIAGNOSIS — G40909 Epilepsy, unspecified, not intractable, without status epilepticus: Secondary | ICD-10-CM | POA: Diagnosis not present

## 2024-07-03 DIAGNOSIS — Z741 Need for assistance with personal care: Secondary | ICD-10-CM | POA: Diagnosis not present

## 2024-07-04 DIAGNOSIS — Z741 Need for assistance with personal care: Secondary | ICD-10-CM | POA: Diagnosis not present

## 2024-07-04 DIAGNOSIS — M6281 Muscle weakness (generalized): Secondary | ICD-10-CM | POA: Diagnosis not present

## 2024-07-04 DIAGNOSIS — G40909 Epilepsy, unspecified, not intractable, without status epilepticus: Secondary | ICD-10-CM | POA: Diagnosis not present

## 2024-07-05 DIAGNOSIS — M6281 Muscle weakness (generalized): Secondary | ICD-10-CM | POA: Diagnosis not present

## 2024-07-05 DIAGNOSIS — G40909 Epilepsy, unspecified, not intractable, without status epilepticus: Secondary | ICD-10-CM | POA: Diagnosis not present

## 2024-07-05 DIAGNOSIS — Z741 Need for assistance with personal care: Secondary | ICD-10-CM | POA: Diagnosis not present

## 2024-07-06 DIAGNOSIS — M6281 Muscle weakness (generalized): Secondary | ICD-10-CM | POA: Diagnosis not present

## 2024-07-06 DIAGNOSIS — Z741 Need for assistance with personal care: Secondary | ICD-10-CM | POA: Diagnosis not present

## 2024-07-06 DIAGNOSIS — G40909 Epilepsy, unspecified, not intractable, without status epilepticus: Secondary | ICD-10-CM | POA: Diagnosis not present

## 2024-07-09 DIAGNOSIS — Z741 Need for assistance with personal care: Secondary | ICD-10-CM | POA: Diagnosis not present

## 2024-07-09 DIAGNOSIS — M6281 Muscle weakness (generalized): Secondary | ICD-10-CM | POA: Diagnosis not present

## 2024-07-09 DIAGNOSIS — G40909 Epilepsy, unspecified, not intractable, without status epilepticus: Secondary | ICD-10-CM | POA: Diagnosis not present

## 2024-07-10 DIAGNOSIS — G40909 Epilepsy, unspecified, not intractable, without status epilepticus: Secondary | ICD-10-CM | POA: Diagnosis not present

## 2024-07-10 DIAGNOSIS — M6281 Muscle weakness (generalized): Secondary | ICD-10-CM | POA: Diagnosis not present

## 2024-07-10 DIAGNOSIS — Z741 Need for assistance with personal care: Secondary | ICD-10-CM | POA: Diagnosis not present

## 2024-07-11 DIAGNOSIS — G40909 Epilepsy, unspecified, not intractable, without status epilepticus: Secondary | ICD-10-CM | POA: Diagnosis not present

## 2024-07-11 DIAGNOSIS — M6281 Muscle weakness (generalized): Secondary | ICD-10-CM | POA: Diagnosis not present

## 2024-07-11 DIAGNOSIS — Z741 Need for assistance with personal care: Secondary | ICD-10-CM | POA: Diagnosis not present

## 2024-07-12 DIAGNOSIS — M6281 Muscle weakness (generalized): Secondary | ICD-10-CM | POA: Diagnosis not present

## 2024-07-12 DIAGNOSIS — Z741 Need for assistance with personal care: Secondary | ICD-10-CM | POA: Diagnosis not present

## 2024-07-12 DIAGNOSIS — G40909 Epilepsy, unspecified, not intractable, without status epilepticus: Secondary | ICD-10-CM | POA: Diagnosis not present
